# Patient Record
Sex: Female | Born: 1955 | Race: White | Hispanic: No | Marital: Married | Smoking: Current every day smoker
Health system: Southern US, Community
[De-identification: ages and names within clinical notes are randomized; demographics above are authoritative.]

## PROBLEM LIST (undated history)

## (undated) DIAGNOSIS — Z973 Presence of spectacles and contact lenses: Secondary | ICD-10-CM

## (undated) DIAGNOSIS — G43909 Migraine, unspecified, not intractable, without status migrainosus: Secondary | ICD-10-CM

## (undated) DIAGNOSIS — N390 Urinary tract infection, site not specified: Secondary | ICD-10-CM

## (undated) DIAGNOSIS — F419 Anxiety disorder, unspecified: Secondary | ICD-10-CM

## (undated) DIAGNOSIS — E119 Type 2 diabetes mellitus without complications: Secondary | ICD-10-CM

## (undated) DIAGNOSIS — Z972 Presence of dental prosthetic device (complete) (partial): Secondary | ICD-10-CM

## (undated) DIAGNOSIS — Z9289 Personal history of other medical treatment: Secondary | ICD-10-CM

## (undated) DIAGNOSIS — M1990 Unspecified osteoarthritis, unspecified site: Secondary | ICD-10-CM

## (undated) DIAGNOSIS — K219 Gastro-esophageal reflux disease without esophagitis: Secondary | ICD-10-CM

## (undated) DIAGNOSIS — J189 Pneumonia, unspecified organism: Secondary | ICD-10-CM

## (undated) HISTORY — PX: JOINT REPLACEMENT: SHX530

## (undated) HISTORY — PX: MULTIPLE TOOTH EXTRACTIONS: SHX2053

## (undated) HISTORY — PX: WRIST FRACTURE SURGERY: SHX121

## (undated) HISTORY — PX: FRACTURE SURGERY: SHX138

## (undated) HISTORY — PX: FEMUR FRACTURE SURGERY: SHX633

---

## 1988-04-17 HISTORY — PX: SPLENECTOMY, TOTAL: SHX788

## 1988-04-17 HISTORY — PX: APPENDECTOMY: SHX54

## 2014-04-17 HISTORY — PX: TOTAL HIP ARTHROPLASTY: SHX124

## 2017-02-24 DIAGNOSIS — F419 Anxiety disorder, unspecified: Secondary | ICD-10-CM

## 2017-02-24 DIAGNOSIS — Z720 Tobacco use: Secondary | ICD-10-CM

## 2017-02-24 DIAGNOSIS — S7292XA Unspecified fracture of left femur, initial encounter for closed fracture: Secondary | ICD-10-CM | POA: Diagnosis not present

## 2017-02-24 DIAGNOSIS — F1010 Alcohol abuse, uncomplicated: Secondary | ICD-10-CM

## 2017-02-25 DIAGNOSIS — S7292XA Unspecified fracture of left femur, initial encounter for closed fracture: Secondary | ICD-10-CM | POA: Diagnosis not present

## 2017-02-25 DIAGNOSIS — Z720 Tobacco use: Secondary | ICD-10-CM | POA: Diagnosis not present

## 2017-02-25 DIAGNOSIS — F1010 Alcohol abuse, uncomplicated: Secondary | ICD-10-CM | POA: Diagnosis not present

## 2017-02-25 DIAGNOSIS — F419 Anxiety disorder, unspecified: Secondary | ICD-10-CM | POA: Diagnosis not present

## 2017-02-26 DIAGNOSIS — F1010 Alcohol abuse, uncomplicated: Secondary | ICD-10-CM | POA: Diagnosis not present

## 2017-02-26 DIAGNOSIS — Z720 Tobacco use: Secondary | ICD-10-CM | POA: Diagnosis not present

## 2017-02-26 DIAGNOSIS — S7292XA Unspecified fracture of left femur, initial encounter for closed fracture: Secondary | ICD-10-CM | POA: Diagnosis not present

## 2017-02-26 DIAGNOSIS — F419 Anxiety disorder, unspecified: Secondary | ICD-10-CM | POA: Diagnosis not present

## 2017-02-27 DIAGNOSIS — F419 Anxiety disorder, unspecified: Secondary | ICD-10-CM | POA: Diagnosis not present

## 2017-02-27 DIAGNOSIS — S7292XA Unspecified fracture of left femur, initial encounter for closed fracture: Secondary | ICD-10-CM | POA: Diagnosis not present

## 2017-02-27 DIAGNOSIS — F1010 Alcohol abuse, uncomplicated: Secondary | ICD-10-CM | POA: Diagnosis not present

## 2017-02-27 DIAGNOSIS — Z720 Tobacco use: Secondary | ICD-10-CM | POA: Diagnosis not present

## 2017-02-28 DIAGNOSIS — S7292XA Unspecified fracture of left femur, initial encounter for closed fracture: Secondary | ICD-10-CM | POA: Diagnosis not present

## 2017-02-28 DIAGNOSIS — F1010 Alcohol abuse, uncomplicated: Secondary | ICD-10-CM | POA: Diagnosis not present

## 2017-02-28 DIAGNOSIS — F419 Anxiety disorder, unspecified: Secondary | ICD-10-CM | POA: Diagnosis not present

## 2017-02-28 DIAGNOSIS — Z720 Tobacco use: Secondary | ICD-10-CM | POA: Diagnosis not present

## 2017-03-01 DIAGNOSIS — S7292XA Unspecified fracture of left femur, initial encounter for closed fracture: Secondary | ICD-10-CM | POA: Diagnosis not present

## 2017-03-01 DIAGNOSIS — F419 Anxiety disorder, unspecified: Secondary | ICD-10-CM | POA: Diagnosis not present

## 2017-03-01 DIAGNOSIS — F1010 Alcohol abuse, uncomplicated: Secondary | ICD-10-CM | POA: Diagnosis not present

## 2017-03-01 DIAGNOSIS — Z720 Tobacco use: Secondary | ICD-10-CM | POA: Diagnosis not present

## 2017-12-25 ENCOUNTER — Other Ambulatory Visit: Payer: Self-pay

## 2017-12-25 ENCOUNTER — Inpatient Hospital Stay (HOSPITAL_COMMUNITY): Payer: BLUE CROSS/BLUE SHIELD

## 2017-12-25 ENCOUNTER — Encounter (HOSPITAL_COMMUNITY): Payer: Self-pay | Admitting: General Practice

## 2017-12-25 ENCOUNTER — Inpatient Hospital Stay (HOSPITAL_COMMUNITY)
Admission: AD | Admit: 2017-12-25 | Discharge: 2017-12-30 | DRG: 481 | Disposition: A | Discharge status: Home Health | Payer: BLUE CROSS/BLUE SHIELD | Attending: Internal Medicine | Admitting: Internal Medicine

## 2017-12-25 ENCOUNTER — Telehealth: Payer: Self-pay | Admitting: Emergency Medicine

## 2017-12-25 ENCOUNTER — Encounter (HOSPITAL_COMMUNITY): Payer: Self-pay | Admitting: *Deleted

## 2017-12-25 DIAGNOSIS — Z9081 Acquired absence of spleen: Secondary | ICD-10-CM

## 2017-12-25 DIAGNOSIS — I9581 Postprocedural hypotension: Secondary | ICD-10-CM | POA: Diagnosis not present

## 2017-12-25 DIAGNOSIS — F411 Generalized anxiety disorder: Secondary | ICD-10-CM | POA: Diagnosis present

## 2017-12-25 DIAGNOSIS — S72002A Fracture of unspecified part of neck of left femur, initial encounter for closed fracture: Secondary | ICD-10-CM | POA: Diagnosis not present

## 2017-12-25 DIAGNOSIS — I959 Hypotension, unspecified: Secondary | ICD-10-CM | POA: Diagnosis present

## 2017-12-25 DIAGNOSIS — F329 Major depressive disorder, single episode, unspecified: Secondary | ICD-10-CM | POA: Diagnosis present

## 2017-12-25 DIAGNOSIS — Z833 Family history of diabetes mellitus: Secondary | ICD-10-CM | POA: Diagnosis not present

## 2017-12-25 DIAGNOSIS — B9620 Unspecified Escherichia coli [E. coli] as the cause of diseases classified elsewhere: Secondary | ICD-10-CM | POA: Diagnosis not present

## 2017-12-25 DIAGNOSIS — S72112K Displaced fracture of greater trochanter of left femur, subsequent encounter for closed fracture with nonunion: Secondary | ICD-10-CM

## 2017-12-25 DIAGNOSIS — E8889 Other specified metabolic disorders: Secondary | ICD-10-CM | POA: Diagnosis present

## 2017-12-25 DIAGNOSIS — E440 Moderate protein-calorie malnutrition: Secondary | ICD-10-CM | POA: Diagnosis present

## 2017-12-25 DIAGNOSIS — E039 Hypothyroidism, unspecified: Secondary | ICD-10-CM | POA: Diagnosis present

## 2017-12-25 DIAGNOSIS — F17210 Nicotine dependence, cigarettes, uncomplicated: Secondary | ICD-10-CM | POA: Diagnosis present

## 2017-12-25 DIAGNOSIS — R338 Other retention of urine: Secondary | ICD-10-CM

## 2017-12-25 DIAGNOSIS — E119 Type 2 diabetes mellitus without complications: Secondary | ICD-10-CM | POA: Diagnosis present

## 2017-12-25 DIAGNOSIS — D62 Acute posthemorrhagic anemia: Secondary | ICD-10-CM | POA: Diagnosis not present

## 2017-12-25 DIAGNOSIS — S7292XA Unspecified fracture of left femur, initial encounter for closed fracture: Secondary | ICD-10-CM

## 2017-12-25 DIAGNOSIS — M79609 Pain in unspecified limb: Secondary | ICD-10-CM | POA: Diagnosis not present

## 2017-12-25 DIAGNOSIS — K219 Gastro-esophageal reflux disease without esophagitis: Secondary | ICD-10-CM | POA: Diagnosis present

## 2017-12-25 DIAGNOSIS — M62838 Other muscle spasm: Secondary | ICD-10-CM | POA: Diagnosis present

## 2017-12-25 DIAGNOSIS — I10 Essential (primary) hypertension: Secondary | ICD-10-CM | POA: Diagnosis present

## 2017-12-25 DIAGNOSIS — D509 Iron deficiency anemia, unspecified: Secondary | ICD-10-CM | POA: Diagnosis present

## 2017-12-25 DIAGNOSIS — F32A Depression, unspecified: Secondary | ICD-10-CM | POA: Diagnosis present

## 2017-12-25 DIAGNOSIS — Y792 Prosthetic and other implants, materials and accessory orthopedic devices associated with adverse incidents: Secondary | ICD-10-CM | POA: Diagnosis present

## 2017-12-25 DIAGNOSIS — Z419 Encounter for procedure for purposes other than remedying health state, unspecified: Secondary | ICD-10-CM

## 2017-12-25 DIAGNOSIS — J449 Chronic obstructive pulmonary disease, unspecified: Secondary | ICD-10-CM | POA: Diagnosis present

## 2017-12-25 DIAGNOSIS — Y848 Other medical procedures as the cause of abnormal reaction of the patient, or of later complication, without mention of misadventure at the time of the procedure: Secondary | ICD-10-CM | POA: Diagnosis present

## 2017-12-25 DIAGNOSIS — M9702XD Periprosthetic fracture around internal prosthetic left hip joint, subsequent encounter: Secondary | ICD-10-CM | POA: Diagnosis not present

## 2017-12-25 DIAGNOSIS — E559 Vitamin D deficiency, unspecified: Secondary | ICD-10-CM | POA: Diagnosis present

## 2017-12-25 DIAGNOSIS — D508 Other iron deficiency anemias: Secondary | ICD-10-CM | POA: Diagnosis not present

## 2017-12-25 DIAGNOSIS — N390 Urinary tract infection, site not specified: Secondary | ICD-10-CM | POA: Diagnosis not present

## 2017-12-25 DIAGNOSIS — S72332K Displaced oblique fracture of shaft of left femur, subsequent encounter for closed fracture with nonunion: Secondary | ICD-10-CM

## 2017-12-25 DIAGNOSIS — M7989 Other specified soft tissue disorders: Secondary | ICD-10-CM | POA: Diagnosis not present

## 2017-12-25 DIAGNOSIS — R0602 Shortness of breath: Secondary | ICD-10-CM | POA: Diagnosis present

## 2017-12-25 DIAGNOSIS — T84228A Displacement of internal fixation device of other bones, initial encounter: Secondary | ICD-10-CM | POA: Diagnosis present

## 2017-12-25 DIAGNOSIS — T1490XA Injury, unspecified, initial encounter: Secondary | ICD-10-CM

## 2017-12-25 HISTORY — DX: Urinary tract infection, site not specified: N39.0

## 2017-12-25 HISTORY — DX: Personal history of other medical treatment: Z92.89

## 2017-12-25 HISTORY — DX: Migraine, unspecified, not intractable, without status migrainosus: G43.909

## 2017-12-25 LAB — CBC WITH DIFFERENTIAL/PLATELET
Abs Immature Granulocytes: 0 10*3/uL (ref 0.0–0.1)
Basophils Absolute: 0.1 10*3/uL (ref 0.0–0.1)
Basophils Relative: 1 %
EOS PCT: 2 %
Eosinophils Absolute: 0.1 10*3/uL (ref 0.0–0.7)
HEMATOCRIT: 27.9 % — AB (ref 36.0–46.0)
Hemoglobin: 8.8 g/dL — ABNORMAL LOW (ref 12.0–15.0)
Immature Granulocytes: 0 %
LYMPHS ABS: 2.4 10*3/uL (ref 0.7–4.0)
Lymphocytes Relative: 41 %
MCH: 23.4 pg — AB (ref 26.0–34.0)
MCHC: 31.5 g/dL (ref 30.0–36.0)
MCV: 74.2 fL — AB (ref 78.0–100.0)
MONOS PCT: 11 %
Monocytes Absolute: 0.7 10*3/uL (ref 0.1–1.0)
Neutro Abs: 2.6 10*3/uL (ref 1.7–7.7)
Neutrophils Relative %: 45 %
Platelets: 418 10*3/uL — ABNORMAL HIGH (ref 150–400)
RBC: 3.76 MIL/uL — AB (ref 3.87–5.11)
RDW: 19.9 % — AB (ref 11.5–15.5)
WBC: 5.8 10*3/uL (ref 4.0–10.5)

## 2017-12-25 LAB — BASIC METABOLIC PANEL
Anion gap: 11 (ref 5–15)
BUN: 17 mg/dL (ref 8–23)
CHLORIDE: 102 mmol/L (ref 98–111)
CO2: 20 mmol/L — ABNORMAL LOW (ref 22–32)
Calcium: 8.9 mg/dL (ref 8.9–10.3)
Creatinine, Ser: 0.61 mg/dL (ref 0.44–1.00)
GFR calc Af Amer: >60 mL/min (ref >60)
GFR calc non Af Amer: >60 mL/min (ref >60)
Glucose, Bld: 111 mg/dL — ABNORMAL HIGH (ref 70–99)
Potassium: 4.2 mmol/L (ref 3.5–5.1)
SODIUM: 133 mmol/L — AB (ref 135–145)

## 2017-12-25 LAB — PREPARE RBC (CROSSMATCH)

## 2017-12-25 MED ORDER — SODIUM CHLORIDE 0.9 % IV SOLN
INTRAVENOUS | Status: DC
  Administered 2017-12-25 – 2017-12-26 (×3): via INTRAVENOUS

## 2017-12-25 MED ORDER — ONDANSETRON HCL 4 MG/2ML IJ SOLN: 4.0000 mg | Freq: Four times a day (QID) | INTRAMUSCULAR | Status: DC | PRN

## 2017-12-25 MED ORDER — ONDANSETRON HCL 4 MG PO TABS: 4.0000 mg | ORAL_TABLET | Freq: Four times a day (QID) | ORAL | Status: DC | PRN

## 2017-12-25 MED ORDER — HYDROCODONE-ACETAMINOPHEN 5-325 MG PO TABS
1.0000 | ORAL_TABLET | ORAL | Status: DC | PRN
  Administered 2017-12-25: 1 via ORAL
  Administered 2017-12-26 – 2017-12-27 (×4): 2 via ORAL
  Filled 2017-12-25 (×4): qty 2
  Filled 2017-12-25: qty 1

## 2017-12-25 MED ORDER — ENOXAPARIN SODIUM 30 MG/0.3ML ~~LOC~~ SOLN
30.0000 mg | SUBCUTANEOUS | Status: DC
  Administered 2017-12-25: 30 mg via SUBCUTANEOUS
  Filled 2017-12-25: qty 0.3

## 2017-12-25 MED ORDER — CEFAZOLIN (ANCEF) 1 G IV SOLR: 2.0000 g | INTRAVENOUS | Status: DC

## 2017-12-25 MED ORDER — CEFAZOLIN SODIUM-DEXTROSE 2-4 GM/100ML-% IV SOLN
2.0000 g | INTRAVENOUS | Status: AC
  Administered 2017-12-26: 2 g via INTRAVENOUS
  Filled 2017-12-25 (×2): qty 100

## 2017-12-25 NOTE — H&P (Signed)
History and Physical   Melanie Parsons JXB:[id number] DOB: 1956/03/16 DOA: 12/25/2017  Referring MD/NP/PA: Outpatient  PCP: [name], MD   Outpatient Specialists: Truitt Merle, MD   Patient coming from: Home  Chief Complaint: Left hip pain  HPI: Melanie Parsons is a 63 y.o. female with medical history significant of anxiety disorder, GERD, osteoarthritis, previous history of alcoholism who had peri-prostatic femural fracture last November with repair.  Patient has been doing fine until the last few days when she has had significant pain in the left hip.  She apparently had the nails in the area come loose.  She was seen by orthopedic surgery at [facility] where she had the original surgery done.  Patient was sent over here for repair tomorrow.  She was sent as a direct admit.  No history was sent with the patient.  No accepting physician on record.  History is therefore obtained from the patient indicating that she has been set up for possible surgery tomorrow.  She has no fever, no chills no nausea vomiting or diarrhea  ED Course: Patient was directly admitted  Review of Systems: As per HPI otherwise 10 point review of systems negative.    Past Medical History:  Diagnosis Date  . Anxiety   . Arthritis   . GERD (gastroesophageal reflux disease)   . Headache    migraines  . Pneumonia   . Wears glasses   . Wears partial dentures     Past Surgical History:  Procedure Laterality Date  . APPENDECTOMY    . FEMUR SURGERY    . JOINT REPLACEMENT     left hip 2016  . MULTIPLE TOOTH EXTRACTIONS    . SPLENECTOMY, TOTAL     1990  . WRIST SURGERY     right     reports that she has been smoking cigarettes. She has been smoking about 0.50 packs per day. She has never used smokeless tobacco. She reports that she drank alcohol. She reports that she does not use drugs.  Allergies  Allergen Reactions  . Penicillins Rash    Rash and itching    No family history  on file.   Prior to Admission medications   Not on File    Physical Exam: Vitals:   12/25/17 1829  BP: 97/71  Pulse: 88  Resp: 15  Temp: 97.9 F (36.6 C)  TempSrc: Oral  SpO2: 97%  Weight: 47.1 kg  Height: 5\' 10"  (1.778 m)      Constitutional: NAD, calm, comfortable Vitals:   12/25/17 1829  BP: 97/71  Pulse: 88  Resp: 15  Temp: 97.9 F (36.6 C)  TempSrc: Oral  SpO2: 97%  Weight: 47.1 kg  Height: 5\' 10"  (1.778 m)   Eyes: PERRL, lids and conjunctivae normal ENMT: Mucous membranes are moist. Posterior pharynx clear of any exudate or lesions.Normal dentition.  Neck: normal, supple, no masses, no thyromegaly Respiratory: clear to auscultation bilaterally, no wheezing, no crackles. Normal respiratory effort. No accessory muscle use.  Cardiovascular: Regular rate and rhythm, no murmurs / rubs / gallops. No extremity edema. 2+ pedal pulses. No carotid bruits.  Abdomen: no tenderness, no masses palpated. No hepatosplenomegaly. Bowel sounds positive.  Musculoskeletal: no clubbing / cyanosis.  Tenderness with lateral rotation of left lower extremities. Good ROM, no contractures. Normal muscle tone.  Skin: no rashes, lesions, ulcers. No induration Neurologic: CN 2-12 grossly intact. Sensation intact, DTR normal. Strength 5/5 in all 4.  Psychiatric: Normal judgment and  insight. Alert and oriented x 3. Normal mood.     Labs on Admission: I have personally reviewed following labs and imaging studies  CBC: No results for input(s): WBC, NEUTROABS, HGB, HCT, MCV, PLT in the last 168 hours. Basic Metabolic Panel: No results for input(s): NA, K, CL, CO2, GLUCOSE, BUN, CREATININE, CALCIUM, MG, PHOS in the last 168 hours. GFR: CrCl cannot be calculated (No successful lab value found.). Liver Function Tests: No results for input(s): AST, ALT, ALKPHOS, BILITOT, PROT, ALBUMIN in the last 168 hours. No results for input(s): LIPASE, AMYLASE in the last 168 hours. No results for  input(s): AMMONIA in the last 168 hours. Coagulation Profile: No results for input(s): INR, PROTIME in the last 168 hours. Cardiac Enzymes: No results for input(s): CKTOTAL, CKMB, CKMBINDEX, TROPONINI in the last 168 hours. BNP (last 3 results) No results for input(s): PROBNP in the last 8760 hours. HbA1C: No results for input(s): HGBA1C in the last 72 hours. CBG: No results for input(s): GLUCAP in the last 168 hours. Lipid Profile: No results for input(s): CHOL, HDL, LDLCALC, TRIG, CHOLHDL, LDLDIRECT in the last 72 hours. Thyroid Function Tests: No results for input(s): TSH, T4TOTAL, FREET4, T3FREE, THYROIDAB in the last 72 hours. Anemia Panel: No results for input(s): VITAMINB12, FOLATE, FERRITIN, TIBC, IRON, RETICCTPCT in the last 72 hours. Urine analysis: No results found for: COLORURINE, APPEARANCEUR, LABSPEC, PHURINE, GLUCOSEU, HGBUR, BILIRUBINUR, KETONESUR, PROTEINUR, UROBILINOGEN, NITRITE, LEUKOCYTESUR Sepsis Labs: @LABRCNTIP (procalcitonin:4,lacticidven:4) )No results found for this or any previous visit (from the past 240 hour(s)).   Radiological Exams on Admission: No results found.   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  Also has been contacted.  Patient appears medically stable for surgery.  We will keep her n.p.o. after midnight  #2 GERD: Continue with PPIs.  #3 COPD: Mild with no exacerbation.  Continue treatment  #4 hypertension: Continue blood pressure monitoring.  #5 generalized anxiety disorder: Continue Xanax.  #6 depression: Continue home regimen   DVT prophylaxis:  Lovenox Code Status: Full code Family Communication: Husband who is with patient Disposition Plan: Home Consults called: Orthopedics, Dr. Jena Gauss Admission status: Inpatient  Severity of Illness: The appropriate patient status for this patient is INPATIENT. Inpatient status is judged to be reasonable and necessary in order to provide the required intensity of service to ensure the patient's safety. The patient's presenting symptoms, physical exam findings, and initial radiographic and laboratory data in the context of their chronic comorbidities is felt to place them at high risk for further clinical deterioration. Furthermore, it is not anticipated that the patient will be medically stable for discharge from the hospital within 2 midnights of admission. The following factors support the patient status of inpatient.   " The patient's presenting symptoms include left hip pain. " The worrisome physical exam findings include tenderness and lateral rotation of the left hip. " The initial radiographic and laboratory data are worrisome because of x-ray showing dislocated previous fracture. " The chronic co-morbidities include COPD.   * I certify that at the point of admission it is my clinical judgment that the patient will require inpatient hospital care spanning beyond 2  midnights from the point of admission due to high intensity of service, high risk for further deterioration and high frequency of surveillance required.Lonia Blood MD [facility] Pager [phone]  If 7PM-7AM, please contact night-coverage [email or web address] Password [facility]  12/25/2017, 7:49 PM

## 2017-12-26 ENCOUNTER — Inpatient Hospital Stay (HOSPITAL_COMMUNITY): Payer: BLUE CROSS/BLUE SHIELD

## 2017-12-26 ENCOUNTER — Encounter (HOSPITAL_COMMUNITY): Payer: Self-pay | Admitting: Anesthesiology

## 2017-12-26 ENCOUNTER — Inpatient Hospital Stay (HOSPITAL_COMMUNITY): Payer: BLUE CROSS/BLUE SHIELD | Admitting: Anesthesiology

## 2017-12-26 ENCOUNTER — Encounter (HOSPITAL_COMMUNITY)
Admission: AD | Disposition: A | Discharge status: Home Health | Payer: Self-pay | Source: Home / Self Care | Attending: Internal Medicine

## 2017-12-26 ENCOUNTER — Inpatient Hospital Stay (HOSPITAL_COMMUNITY): Admission: RE | Admit: 2017-12-26 | Payer: BLUE CROSS/BLUE SHIELD | Source: Ambulatory Visit | Admitting: Student

## 2017-12-26 DIAGNOSIS — E039 Hypothyroidism, unspecified: Secondary | ICD-10-CM

## 2017-12-26 DIAGNOSIS — S72332K Displaced oblique fracture of shaft of left femur, subsequent encounter for closed fracture with nonunion: Secondary | ICD-10-CM

## 2017-12-26 HISTORY — DX: Presence of spectacles and contact lenses: Z97.3

## 2017-12-26 HISTORY — DX: Presence of dental prosthetic device (complete) (partial): Z97.2

## 2017-12-26 HISTORY — DX: Unspecified osteoarthritis, unspecified site: M19.90

## 2017-12-26 HISTORY — PX: HARDWARE REMOVAL: SHX979

## 2017-12-26 HISTORY — DX: Pneumonia, unspecified organism: J18.9

## 2017-12-26 HISTORY — PX: ORIF PERIPROSTHETIC FRACTURE: SHX5034

## 2017-12-26 HISTORY — DX: Anxiety disorder, unspecified: F41.9

## 2017-12-26 HISTORY — DX: Gastro-esophageal reflux disease without esophagitis: K21.9

## 2017-12-26 LAB — PROTIME-INR
INR: 1.25
Prothrombin Time: 15.6 seconds — ABNORMAL HIGH (ref 11.4–15.2)

## 2017-12-26 LAB — T4, FREE: Free T4: 0.72 ng/dL — ABNORMAL LOW (ref 0.82–1.77)

## 2017-12-26 LAB — COMPREHENSIVE METABOLIC PANEL
ALT: 18 U/L (ref 0–44)
ANION GAP: 9 (ref 5–15)
AST: 24 U/L (ref 15–41)
Albumin: 2.6 g/dL — ABNORMAL LOW (ref 3.5–5.0)
Alkaline Phosphatase: 64 U/L (ref 38–126)
BUN: 14 mg/dL (ref 8–23)
CHLORIDE: 107 mmol/L (ref 98–111)
CO2: 20 mmol/L — AB (ref 22–32)
CREATININE: 0.5 mg/dL (ref 0.44–1.00)
Calcium: 8.7 mg/dL — ABNORMAL LOW (ref 8.9–10.3)
Glucose, Bld: 80 mg/dL (ref 70–99)
POTASSIUM: 3.8 mmol/L (ref 3.5–5.1)
SODIUM: 136 mmol/L (ref 135–145)
Total Bilirubin: 0.3 mg/dL (ref 0.3–1.2)
Total Protein: 5.7 g/dL — ABNORMAL LOW (ref 6.5–8.1)

## 2017-12-26 LAB — URINALYSIS, ROUTINE W REFLEX MICROSCOPIC
Bilirubin Urine: NEGATIVE
GLUCOSE, UA: NEGATIVE mg/dL
KETONES UR: 5 mg/dL — AB
Nitrite: POSITIVE — AB
PH: 5 (ref 5.0–8.0)
Protein, ur: NEGATIVE mg/dL
Specific Gravity, Urine: 1.016 (ref 1.005–1.030)

## 2017-12-26 LAB — ABO/RH: ABO/RH(D): O POS

## 2017-12-26 LAB — HIV ANTIBODY (ROUTINE TESTING W REFLEX): HIV Screen 4th Generation wRfx: NONREACTIVE

## 2017-12-26 LAB — PREALBUMIN: PREALBUMIN: 22.6 mg/dL (ref 18–38)

## 2017-12-26 LAB — CBC
HEMATOCRIT: 27.5 % — AB (ref 36.0–46.0)
HEMOGLOBIN: 8.6 g/dL — AB (ref 12.0–15.0)
MCH: 23.2 pg — AB (ref 26.0–34.0)
MCHC: 31.3 g/dL (ref 30.0–36.0)
MCV: 74.3 fL — AB (ref 78.0–100.0)
PLATELETS: 423 10*3/uL — AB (ref 150–400)
RBC: 3.7 MIL/uL — AB (ref 3.87–5.11)
RDW: 20.1 % — ABNORMAL HIGH (ref 11.5–15.5)
WBC: 4.7 10*3/uL (ref 4.0–10.5)

## 2017-12-26 LAB — MAGNESIUM: Magnesium: 1.7 mg/dL (ref 1.7–2.4)

## 2017-12-26 LAB — SEDIMENTATION RATE: SED RATE: 37 mm/h — AB (ref 0–22)

## 2017-12-26 LAB — C-REACTIVE PROTEIN: CRP: 0.9 mg/dL (ref <1.0)

## 2017-12-26 LAB — SURGICAL PCR SCREEN
MRSA, PCR: NEGATIVE
STAPHYLOCOCCUS AUREUS: NEGATIVE

## 2017-12-26 LAB — HEMOGLOBIN A1C
Hgb A1c MFr Bld: 7.2 % — ABNORMAL HIGH (ref 4.8–5.6)
Mean Plasma Glucose: 159.94 mg/dL

## 2017-12-26 LAB — APTT: aPTT: 44 seconds — ABNORMAL HIGH (ref 24–36)

## 2017-12-26 LAB — GLUCOSE, CAPILLARY: Glucose-Capillary: 98 mg/dL (ref 70–99)

## 2017-12-26 LAB — PHOSPHORUS: PHOSPHORUS: 3.8 mg/dL (ref 2.5–4.6)

## 2017-12-26 LAB — TSH: TSH: 0.05 u[IU]/mL — ABNORMAL LOW (ref 0.350–4.500)

## 2017-12-26 SURGERY — OPEN REDUCTION INTERNAL FIXATION (ORIF) PERIPROSTHETIC FRACTURE
Anesthesia: General | Site: Leg Upper | Laterality: Left

## 2017-12-26 MED ORDER — SODIUM CHLORIDE 0.9 % IR SOLN
Status: DC | PRN
  Administered 2017-12-26: 3000 mL

## 2017-12-26 MED ORDER — ONDANSETRON HCL 4 MG/2ML IJ SOLN
INTRAMUSCULAR | Status: DC | PRN
  Administered 2017-12-26: 4 mg via INTRAVENOUS

## 2017-12-26 MED ORDER — TOBRAMYCIN SULFATE 1.2 G IJ SOLR
INTRAMUSCULAR | Status: DC | PRN
  Administered 2017-12-26: 1.2 g via TOPICAL

## 2017-12-26 MED ORDER — VANCOMYCIN HCL 1000 MG IV SOLR
INTRAVENOUS | Status: AC
  Filled 2017-12-26: qty 1000

## 2017-12-26 MED ORDER — FENTANYL CITRATE (PF) 250 MCG/5ML IJ SOLN
INTRAMUSCULAR | Status: AC
  Filled 2017-12-26: qty 5

## 2017-12-26 MED ORDER — MIDAZOLAM HCL 2 MG/2ML IJ SOLN
INTRAMUSCULAR | Status: AC
  Filled 2017-12-26: qty 2

## 2017-12-26 MED ORDER — SUGAMMADEX SODIUM 200 MG/2ML IV SOLN
INTRAVENOUS | Status: DC | PRN
  Administered 2017-12-26: 100 mg via INTRAVENOUS

## 2017-12-26 MED ORDER — EPHEDRINE 5 MG/ML INJ
INTRAVENOUS | Status: AC
  Filled 2017-12-26: qty 10

## 2017-12-26 MED ORDER — SODIUM CHLORIDE 0.9 % IV SOLN
INTRAVENOUS | Status: DC | PRN
  Administered 2017-12-26: 50 ug/min via INTRAVENOUS

## 2017-12-26 MED ORDER — LACTATED RINGERS IV SOLN
INTRAVENOUS | Status: DC | PRN
  Administered 2017-12-26: 11:00:00 via INTRAVENOUS

## 2017-12-26 MED ORDER — ALPRAZOLAM 0.5 MG PO TABS
1.0000 mg | ORAL_TABLET | Freq: Three times a day (TID) | ORAL | Status: DC | PRN
  Administered 2017-12-27 – 2017-12-30 (×9): 1 mg via ORAL
  Filled 2017-12-26 (×9): qty 2

## 2017-12-26 MED ORDER — PHENYLEPHRINE 40 MCG/ML (10ML) SYRINGE FOR IV PUSH (FOR BLOOD PRESSURE SUPPORT)
PREFILLED_SYRINGE | INTRAVENOUS | Status: AC
  Filled 2017-12-26: qty 10

## 2017-12-26 MED ORDER — MIDAZOLAM HCL 5 MG/5ML IJ SOLN
INTRAMUSCULAR | Status: DC | PRN
  Administered 2017-12-26: 2 mg via INTRAVENOUS

## 2017-12-26 MED ORDER — PROPOFOL 10 MG/ML IV BOLUS
INTRAVENOUS | Status: AC
  Filled 2017-12-26: qty 20

## 2017-12-26 MED ORDER — MIRTAZAPINE 15 MG PO TABS
30.0000 mg | ORAL_TABLET | Freq: Every day | ORAL | Status: DC
  Administered 2017-12-26 – 2017-12-29 (×4): 30 mg via ORAL
  Filled 2017-12-26 (×4): qty 2

## 2017-12-26 MED ORDER — LIDOCAINE 2% (20 MG/ML) 5 ML SYRINGE
INTRAMUSCULAR | Status: AC
  Filled 2017-12-26: qty 5

## 2017-12-26 MED ORDER — PHENYLEPHRINE HCL 10 MG/ML IJ SOLN
INTRAMUSCULAR | Status: DC | PRN
  Administered 2017-12-26 (×2): 80 ug via INTRAVENOUS

## 2017-12-26 MED ORDER — FENTANYL CITRATE (PF) 100 MCG/2ML IJ SOLN
INTRAMUSCULAR | Status: DC | PRN
  Administered 2017-12-26: 50 ug via INTRAVENOUS
  Administered 2017-12-26: 25 ug via INTRAVENOUS
  Administered 2017-12-26: 50 ug via INTRAVENOUS
  Administered 2017-12-26: 25 ug via INTRAVENOUS
  Administered 2017-12-26: 75 ug via INTRAVENOUS
  Administered 2017-12-26: 25 ug via INTRAVENOUS

## 2017-12-26 MED ORDER — DEXAMETHASONE SODIUM PHOSPHATE 10 MG/ML IJ SOLN
INTRAMUSCULAR | Status: DC | PRN
  Administered 2017-12-26: 10 mg via INTRAVENOUS

## 2017-12-26 MED ORDER — TOBRAMYCIN SULFATE 1.2 G IJ SOLR
INTRAMUSCULAR | Status: AC
  Filled 2017-12-26: qty 1.2

## 2017-12-26 MED ORDER — PROPOFOL 10 MG/ML IV BOLUS
INTRAVENOUS | Status: DC | PRN
  Administered 2017-12-26: 100 mg via INTRAVENOUS

## 2017-12-26 MED ORDER — LIDOCAINE 2% (20 MG/ML) 5 ML SYRINGE
INTRAMUSCULAR | Status: DC | PRN
  Administered 2017-12-26: 40 mg via INTRAVENOUS

## 2017-12-26 MED ORDER — HYDROMORPHONE HCL 1 MG/ML IJ SOLN
INTRAMUSCULAR | Status: AC
  Administered 2017-12-26: 0.25 mg via INTRAVENOUS
  Filled 2017-12-26: qty 1

## 2017-12-26 MED ORDER — INSULIN ASPART 100 UNIT/ML ~~LOC~~ SOLN
0.0000 [IU] | Freq: Three times a day (TID) | SUBCUTANEOUS | Status: DC
  Administered 2017-12-27: 2 [IU] via SUBCUTANEOUS

## 2017-12-26 MED ORDER — ROCURONIUM BROMIDE 50 MG/5ML IV SOSY
PREFILLED_SYRINGE | INTRAVENOUS | Status: DC | PRN
  Administered 2017-12-26: 10 mg via INTRAVENOUS
  Administered 2017-12-26: 45 mg via INTRAVENOUS

## 2017-12-26 MED ORDER — DEXAMETHASONE SODIUM PHOSPHATE 10 MG/ML IJ SOLN
INTRAMUSCULAR | Status: AC
  Filled 2017-12-26: qty 2

## 2017-12-26 MED ORDER — PANTOPRAZOLE SODIUM 40 MG PO TBEC
40.0000 mg | DELAYED_RELEASE_TABLET | Freq: Every day | ORAL | Status: DC
  Administered 2017-12-27 – 2017-12-30 (×4): 40 mg via ORAL
  Filled 2017-12-26 (×4): qty 1

## 2017-12-26 MED ORDER — ROCURONIUM BROMIDE 50 MG/5ML IV SOSY
PREFILLED_SYRINGE | INTRAVENOUS | Status: AC
  Filled 2017-12-26: qty 5

## 2017-12-26 MED ORDER — HYDROMORPHONE HCL 1 MG/ML IJ SOLN
0.2500 mg | INTRAMUSCULAR | Status: DC | PRN
  Administered 2017-12-26 (×2): 0.25 mg via INTRAVENOUS

## 2017-12-26 MED ORDER — ADULT MULTIVITAMIN W/MINERALS CH
1.0000 | ORAL_TABLET | Freq: Every day | ORAL | Status: DC
  Administered 2017-12-27 – 2017-12-30 (×4): 1 via ORAL
  Filled 2017-12-26 (×4): qty 1

## 2017-12-26 MED ORDER — HYDROMORPHONE HCL 1 MG/ML IJ SOLN: 1.0000 mg | INTRAMUSCULAR | Status: DC | PRN

## 2017-12-26 MED ORDER — 0.9 % SODIUM CHLORIDE (POUR BTL) OPTIME
TOPICAL | Status: DC | PRN
  Administered 2017-12-26: 1000 mL

## 2017-12-26 MED ORDER — ENSURE ENLIVE PO LIQD
237.0000 mL | Freq: Two times a day (BID) | ORAL | Status: DC
  Administered 2017-12-27: 237 mL via ORAL

## 2017-12-26 MED ORDER — SODIUM CHLORIDE 0.9 % IV SOLN
1.0000 g | INTRAVENOUS | Status: DC
  Administered 2017-12-26 – 2017-12-27 (×2): 1 g via INTRAVENOUS
  Filled 2017-12-26 (×2): qty 10

## 2017-12-26 MED ORDER — ONDANSETRON HCL 4 MG/2ML IJ SOLN
INTRAMUSCULAR | Status: AC
  Filled 2017-12-26: qty 4

## 2017-12-26 MED ORDER — PROMETHAZINE HCL 25 MG/ML IJ SOLN: 6.2500 mg | INTRAMUSCULAR | Status: DC | PRN

## 2017-12-26 MED ORDER — VANCOMYCIN HCL 1000 MG IV SOLR
INTRAVENOUS | Status: DC | PRN
  Administered 2017-12-26: 1000 mg via TOPICAL

## 2017-12-26 MED ORDER — CEFAZOLIN SODIUM-DEXTROSE 2-4 GM/100ML-% IV SOLN
2.0000 g | Freq: Three times a day (TID) | INTRAVENOUS | Status: DC
  Administered 2017-12-26 – 2017-12-27 (×2): 2 g via INTRAVENOUS
  Filled 2017-12-26 (×3): qty 100

## 2017-12-26 SURGICAL SUPPLY — 69 items
BIT DRILL QC 3.3X195 (BIT) ×4 IMPLANT
BLADE CLIPPER SURG (BLADE) IMPLANT
BNDG COHESIVE 6X5 TAN STRL LF (GAUZE/BANDAGES/DRESSINGS) IMPLANT
BNDG ELASTIC 6X10 VLCR STRL LF (GAUZE/BANDAGES/DRESSINGS) IMPLANT
BRUSH SCRUB SURG 4.25 DISP (MISCELLANEOUS) ×4 IMPLANT
CABLE CERLAGE W/CRIMP 1.8 (Cable) ×3 IMPLANT
CABLE CERLAGE W/CRIMP 1.8MM (Cable) ×1 IMPLANT
CANISTER SUCT 3000ML PPV (MISCELLANEOUS) ×4 IMPLANT
CAP LOCK NCB (Cap) ×36 IMPLANT
CHLORAPREP W/TINT 26ML (MISCELLANEOUS) ×4 IMPLANT
CONT SPECI 4OZ STER CLIK (MISCELLANEOUS) ×4 IMPLANT
COVER SURGICAL LIGHT HANDLE (MISCELLANEOUS) ×4 IMPLANT
DRAPE C-ARM 42X72 X-RAY (DRAPES) ×4 IMPLANT
DRAPE C-ARMOR (DRAPES) ×4 IMPLANT
DRAPE ORTHO SPLIT 77X108 STRL (DRAPES) ×4
DRAPE PROXIMA HALF (DRAPES) ×8 IMPLANT
DRAPE SURG 17X23 STRL (DRAPES) ×4 IMPLANT
DRAPE SURG ORHT 6 SPLT 77X108 (DRAPES) ×4 IMPLANT
DRAPE U-SHAPE 47X51 STRL (DRAPES) ×4 IMPLANT
DRSG ADAPTIC 3X8 NADH LF (GAUZE/BANDAGES/DRESSINGS) IMPLANT
DRSG AQUACEL AG ADV 3.5X14 (GAUZE/BANDAGES/DRESSINGS) ×4 IMPLANT
DRSG MEPILEX BORDER 4X12 (GAUZE/BANDAGES/DRESSINGS) IMPLANT
DRSG MEPILEX BORDER 4X4 (GAUZE/BANDAGES/DRESSINGS) IMPLANT
DRSG MEPILEX BORDER 4X8 (GAUZE/BANDAGES/DRESSINGS) IMPLANT
DRSG PAD ABDOMINAL 8X10 ST (GAUZE/BANDAGES/DRESSINGS) ×12 IMPLANT
ELECT REM PT RETURN 9FT ADLT (ELECTROSURGICAL) ×4
ELECTRODE REM PT RTRN 9FT ADLT (ELECTROSURGICAL) ×2 IMPLANT
GAUZE SPONGE 4X4 12PLY STRL (GAUZE/BANDAGES/DRESSINGS) ×4 IMPLANT
GLOVE BIO SURGEON STRL SZ7.5 (GLOVE) ×16 IMPLANT
GLOVE BIOGEL PI IND STRL 7.5 (GLOVE) ×2 IMPLANT
GLOVE BIOGEL PI INDICATOR 7.5 (GLOVE) ×2
GOWN STRL REUS W/ TWL LRG LVL3 (GOWN DISPOSABLE) ×6 IMPLANT
GOWN STRL REUS W/TWL LRG LVL3 (GOWN DISPOSABLE) ×6
HANDPIECE INTERPULSE COAX TIP (DISPOSABLE) ×2
K-WIRE 2.0 (WIRE) ×2
K-WIRE FXSTD 280X2XNS SS (WIRE) ×2
KIT BASIN OR (CUSTOM PROCEDURE TRAY) ×4 IMPLANT
KIT TURNOVER KIT B (KITS) ×4 IMPLANT
KWIRE FXSTD 280X2XNS SS (WIRE) ×2 IMPLANT
LOCKPLATE CABLE BUTTON NCP HIP (Orthopedic Implant) ×4 IMPLANT
NS IRRIG 1000ML POUR BTL (IV SOLUTION) ×4 IMPLANT
PACK TOTAL JOINT (CUSTOM PROCEDURE TRAY) ×4 IMPLANT
PAD ARMBOARD 7.5X6 YLW CONV (MISCELLANEOUS) ×8 IMPLANT
PAD CAST 4YDX4 CTTN HI CHSV (CAST SUPPLIES) ×2 IMPLANT
PADDING CAST COTTON 4X4 STRL (CAST SUPPLIES) ×2
PADDING CAST COTTON 6X4 STRL (CAST SUPPLIES) ×4 IMPLANT
PLATE LT PROX FEMUR 12H (Plate) ×4 IMPLANT
SCREW 5.0 32MM (Screw) ×4 IMPLANT
SCREW NCB 4.0 32MM (Screw) ×4 IMPLANT
SCREW NCB 4.0MX34M (Screw) ×16 IMPLANT
SCREW NCB 4.0MX38M (Screw) ×4 IMPLANT
SCREW NCB 4.0MX42M (Screw) ×8 IMPLANT
SCREW NCB 4.0MX46M (Screw) ×4 IMPLANT
SCREW NCB 4.0X36MM (Screw) ×8 IMPLANT
SET HNDPC FAN SPRY TIP SCT (DISPOSABLE) ×2 IMPLANT
SPONGE LAP 18X18 X RAY DECT (DISPOSABLE) ×8 IMPLANT
STAPLER VISISTAT 35W (STAPLE) ×4 IMPLANT
SUCTION FRAZIER HANDLE 10FR (MISCELLANEOUS) ×2
SUCTION TUBE FRAZIER 10FR DISP (MISCELLANEOUS) ×2 IMPLANT
SUT ETHILON 3 0 PS 1 (SUTURE) ×16 IMPLANT
SUT VIC AB 0 CT1 27 (SUTURE)
SUT VIC AB 0 CT1 27XBRD ANBCTR (SUTURE) IMPLANT
SUT VIC AB 1 CT1 27 (SUTURE)
SUT VIC AB 1 CT1 27XBRD ANBCTR (SUTURE) IMPLANT
SUT VIC AB 2-0 CT1 27 (SUTURE) ×4
SUT VIC AB 2-0 CT1 TAPERPNT 27 (SUTURE) ×4 IMPLANT
TOWEL OR 17X26 10 PK STRL BLUE (TOWEL DISPOSABLE) ×8 IMPLANT
TRAY FOLEY MTR SLVR 16FR STAT (SET/KITS/TRAYS/PACK) IMPLANT
WATER STERILE IRR 1000ML POUR (IV SOLUTION) ×8 IMPLANT

## 2017-12-26 NOTE — Consult Note (Addendum)
Orthopaedic Trauma Service (OTS) Consult   Patient ID: Orissa Whisonant MRN: [id number] DOB/AGE: [birth year] 62 y.o.  Reason for Consult:Left femur nonunion Referring Physician: Dr. Garnette Gunner, MD [facility]  HPI: Zaynah Adelson Meily Evon is an 62 y.o. female who is being seen in consultation at the request of Dr. [name] for evaluation of a left femur nonunion.  Patient had a periprosthetic femur fracture in November of last year.  This was fixed with an ORIF.  The patient had a hemiarthroplasty performed in 2016 and she was doing fine from that she was ambulating without assist device.  She fell and broke her femur distal to the prosthesis.  She was fixed and then made touchdown weightbearing for approximately 3 months.  She was allowed to start ambulating however according to the patient she had some malalignment of her leg and she continued to have pain.  Over the last 2 weeks she noticed a significant deformity to her leg and not over her lateral femur.  She was at eventually seen by her initial surgeon Dr. [name] who sent her to the hospital and she was subsequently referred to [place] and transferred to [facility].  The patient states that she was never put on antibiotics for any draining wounds.  She had no problems with her incision healing.  The patient denies any major medical problems she states that she has anxiety for which she takes Xanax 3 times a day as needed.  She also takes medication for reflux and also for sleep at night.  She denies any history of diabetes however she says that she does not eat as well recently she has wanted chocolate milk and honey buns.  She denies a significant alcohol intake to me.  But she does have a history of alcohol abuse in her chart.  She states that she smokes about a pack of cigarettes a day and has been doing so for multiple years.  She also notes that she has a history of anemia.  Past Medical History:  Diagnosis Date   . Anxiety   . Arthritis    "joints" (12/25/2017)  . GERD (gastroesophageal reflux disease)   . History of blood transfusion    "related RX I took; made my have my period q 2 wks"  . Migraine    "had one 3 days last week" (12/25/2017)  . Pneumonia    "walking pneumonia one times" (12/25/2017)  . Wears glasses   . Wears partial dentures     Past Surgical History:  Procedure Laterality Date  . APPENDECTOMY  1990  . FEMUR FRACTURE SURGERY Left   . FRACTURE SURGERY    . JOINT REPLACEMENT    . MULTIPLE TOOTH EXTRACTIONS    . SPLENECTOMY, TOTAL  1990  . TOTAL HIP ARTHROPLASTY Left 2016  . WRIST FRACTURE SURGERY Right    "put in titanium rod"    History reviewed. No pertinent family history.  Social History:  reports that she has been smoking cigarettes. She has a 15.00 pack-year smoking history. She has never used smokeless tobacco. She reports that she drank alcohol. She reports that she does not use drugs.  Allergies:  Allergies  Allergen Reactions  . Penicillins Rash    Rash and itching    Medications:  No current facility-administered medications on file prior to encounter.    No current outpatient medications on file prior to encounter.   Currently on #1.  Xanax 1 mg 3 times daily as  needed 2.  Mirtazapine 30 mg nightly 3.  Omeprazole 20 mg daily  ROS: Constitutional: No fever or chills Vision: No changes in vision ENT: No difficulty swallowing CV: No chest pain Pulm: No SOB or wheezing GI: No nausea or vomiting, occasional constipation GU: No urgency or inability to hold urine Skin: No poor wound healing Neurologic: No numbness or tingling Psychiatric: +anxiety Heme: No bruising, + for anemia Allergic: No reaction to medications or food   Exam: Blood pressure 101/72, pulse 94, temperature 98.1 F (36.7 C), temperature source Oral, resp. rate 17, height 5\' 10"  (1.778 m), weight 47.1 kg, SpO2 100 %. General:NAD, thin female Orientation:AAOx3 Mood and  Affect: Cooperative and appropriate affect Gait: Unable to assess due to deformity and pain Coordination and balance: Within normal limits No increased work of breathing, +cough Abdomen soft and nontender CV: Regular rate and rhythm  Injured Extremity (CV, lymph, sensation, reflexes): Left lower extremity reveals a well-healed incision.  No signs of breakdown.  She has an obvious deformity about her left thigh with significant varus with the plate which is palpable underneath her skin.  She is very thin over her leg.  She has limited range of motion of her hip and knee secondary to pain.  Her compartments are soft and compressible.  She has active dorsiflexion plantarflexion of her ankle and great toe with a warm well-perfused foot.  She endorses her sensation to the dorsum and plantar aspect of her foot.  Right lower extremity: Skin without lesions. No tenderness to palpation. Full painless ROM, full strength in each muscle groups without evidence of instability.   Medical Decision Making: Imaging: X-rays of the left femur show a varus mal/nonunion of the periprosthetic femur fracture.  No significant callus formation.  She has failed fixation of her proximal femur fixation.  Labs:  Results for orders placed or performed during the hospital encounter of 12/25/17 (from the past 24 hour(s))  Basic metabolic panel     Status: Abnormal   Collection Time: 12/25/17  8:28 PM  Result Value Ref Range   Sodium 133 (L) 135 - 145 mmol/L   Potassium 4.2 3.5 - 5.1 mmol/L   Chloride 102 98 - 111 mmol/L   CO2 20 (L) 22 - 32 mmol/L   Glucose, Bld 111 (H) 70 - 99 mg/dL   BUN 17 8 - 23 mg/dL   Creatinine, Ser 0.45 0.44 - 1.00 mg/dL   Calcium 8.9 8.9 - 40.9 mg/dL   GFR calc non Af Amer >60 >60 mL/min   GFR calc Af Amer >60 >60 mL/min   Anion gap 11 5 - 15  CBC WITH DIFFERENTIAL     Status: Abnormal   Collection Time: 12/25/17  8:28 PM  Result Value Ref Range   WBC 5.8 4.0 - 10.5 K/uL   RBC 3.76 (L)  3.87 - 5.11 MIL/uL   Hemoglobin 8.8 (L) 12.0 - 15.0 g/dL   HCT 81.1 (L) 91.4 - 78.2 %   MCV 74.2 (L) 78.0 - 100.0 fL   MCH 23.4 (L) 26.0 - 34.0 pg   MCHC 31.5 30.0 - 36.0 g/dL   RDW 95.6 (H) 21.3 - 08.6 %   Platelets 418 (H) 150 - 400 K/uL   Neutrophils Relative % 45 %   Neutro Abs 2.6 1.7 - 7.7 K/uL   Lymphocytes Relative 41 %   Lymphs Abs 2.4 0.7 - 4.0 K/uL   Monocytes Relative 11 %   Monocytes Absolute 0.7 0.1 - 1.0 K/uL  Eosinophils Relative 2 %   Eosinophils Absolute 0.1 0.0 - 0.7 K/uL   Basophils Relative 1 %   Basophils Absolute 0.1 0.0 - 0.1 K/uL   Immature Granulocytes 0 %   Abs Immature Granulocytes 0.0 0.0 - 0.1 K/uL  Protime-INR     Status: Abnormal   Collection Time: 12/25/17 11:16 PM  Result Value Ref Range   Prothrombin Time 15.6 (H) 11.4 - 15.2 seconds   INR 1.25   Type and screen [facility]     Status: None (Preliminary result)   Collection Time: 12/25/17 11:23 PM  Result Value Ref Range   ABO/RH(D) O POS    Antibody Screen NEG    Sample Expiration 12/28/2017    Unit Number [id number]    Blood Component Type RED CELLS,LR    Unit division 00    Status of Unit ALLOCATED    Transfusion Status OK TO TRANSFUSE    Crossmatch Result      Compatible Performed at [facility], [address] [place], [place] [address]    Unit Number [id number]    Blood Component Type RED CELLS,LR    Unit division 00    Status of Unit ALLOCATED    Transfusion Status OK TO TRANSFUSE    Crossmatch Result Compatible   Prepare RBC     Status: None   Collection Time: 12/25/17 11:23 PM  Result Value Ref Range   Order Confirmation      ORDER PROCESSED BY BLOOD BANK Performed at [facility], [address], [place], [place] [address]   ABO/Rh     Status: None   Collection Time: 12/25/17 11:23 PM  Result Value Ref Range   ABO/RH(D)      O POS Performed at [facility], [address], [place], [place] [address]   Surgical pcr  screen     Status: None   Collection Time: 12/26/17 12:18 AM  Result Value Ref Range   MRSA, PCR NEGATIVE NEGATIVE   Staphylococcus aureus NEGATIVE NEGATIVE  Urinalysis, Routine w reflex microscopic     Status: Abnormal   Collection Time: 12/26/17  4:29 AM  Result Value Ref Range   Color, Urine YELLOW YELLOW   APPearance HAZY (A) CLEAR   Specific Gravity, Urine 1.016 1.005 - 1.030   pH 5.0 5.0 - 8.0   Glucose, UA NEGATIVE NEGATIVE mg/dL   Hgb urine dipstick SMALL (A) NEGATIVE   Bilirubin Urine NEGATIVE NEGATIVE   Ketones, ur 5 (A) NEGATIVE mg/dL   Protein, ur NEGATIVE NEGATIVE mg/dL   Nitrite POSITIVE (A) NEGATIVE   Leukocytes, UA LARGE (A) NEGATIVE   RBC / HPF 11-20 0 - 5 RBC/hpf   WBC, UA >50 (H) 0 - 5 WBC/hpf   Bacteria, UA MANY (A) NONE SEEN   WBC Clumps PRESENT    Mucus PRESENT    Hyaline Casts, UA PRESENT   Comprehensive metabolic panel     Status: Abnormal   Collection Time: 12/26/17  5:37 AM  Result Value Ref Range   Sodium 136 135 - 145 mmol/L   Potassium 3.8 3.5 - 5.1 mmol/L   Chloride 107 98 - 111 mmol/L   CO2 20 (L) 22 - 32 mmol/L   Glucose, Bld 80 70 - 99 mg/dL   BUN 14 8 - 23 mg/dL   Creatinine, Ser 8.41 0.44 - 1.00 mg/dL   Calcium 8.7 (L) 8.9 - 10.3 mg/dL   Total Protein 5.7 (L) 6.5 -  8.1 g/dL   Albumin 2.6 (L) 3.5 - 5.0 g/dL   AST 24 15 - 41 U/L   ALT 18 0 - 44 U/L   Alkaline Phosphatase 64 38 - 126 U/L   Total Bilirubin 0.3 0.3 - 1.2 mg/dL   GFR calc non Af Amer >60 >60 mL/min   GFR calc Af Amer >60 >60 mL/min   Anion gap 9 5 - 15  CBC     Status: Abnormal   Collection Time: 12/26/17  5:37 AM  Result Value Ref Range   WBC 4.7 4.0 - 10.5 K/uL   RBC 3.70 (L) 3.87 - 5.11 MIL/uL   Hemoglobin 8.6 (L) 12.0 - 15.0 g/dL   HCT 16.1 (L) 09.6 - 04.5 %   MCV 74.3 (L) 78.0 - 100.0 fL   MCH 23.2 (L) 26.0 - 34.0 pg   MCHC 31.3 30.0 - 36.0 g/dL   RDW 40.9 (H) 81.1 - 91.4 %   Platelets 423 (H) 150 - 400 K/uL  APTT     Status: Abnormal   Collection Time:  12/26/17  5:37 AM  Result Value Ref Range   aPTT 44 (H) 24 - 36 seconds  TSH     Status: Abnormal   Collection Time: 12/26/17  5:37 AM  Result Value Ref Range   TSH 0.050 (L) 0.350 - 4.500 uIU/mL  Magnesium     Status: None   Collection Time: 12/26/17  5:37 AM  Result Value Ref Range   Magnesium 1.7 1.7 - 2.4 mg/dL  Phosphorus     Status: None   Collection Time: 12/26/17  5:37 AM  Result Value Ref Range   Phosphorus 3.8 2.5 - 4.6 mg/dL  Hemoglobin N8G     Status: Abnormal   Collection Time: 12/26/17  5:37 AM  Result Value Ref Range   Hgb A1c MFr Bld 7.2 (H) 4.8 - 5.6 %   Mean Plasma Glucose 159.94 mg/dL  Prealbumin     Status: None   Collection Time: 12/26/17  5:37 AM  Result Value Ref Range   Prealbumin 22.6 18 - 38 mg/dL   Medical history and chart was reviewed  Assessment/Plan: 62 year old female with a history of COPD, significant smoking history, and previous history of alcohol use with a left femur malunion/nonunion status post ORIF  Multiple labs were obtained upon arrival.  The patient appears to have a newly diagnosed diabetes with a hemoglobin of A1c of 7.2.  She also has a significant malnutrition with an albumin of 2.6.  She also has chronic anemia with a hemoglobin of 8.6 this morning.   The patient has a significant deformity and will need surgical repair of her nonunion.  This will include a removal of all hardware with realignment of the fracture and revision ORIF.  I discussed with her the risks and benefits of the surgery. Risks discussed included bleeding requiring blood transfusion, bleeding causing a hematoma, infection, malunion, nonunion, damage to surrounding nerves and blood vessels, pain, hardware prominence or irritation, hardware failure, stiffness, post-traumatic arthritis, DVT/PE, compartment syndrome, and even death.  Patient will need optimization of her medical issues including newly diagnosed diabetes, malnutrition as well as a work-up of her low  TSH.  I will also likely institute a acute bone stimulator to assist with healing of her fracture.  Greater than 80 min spend coordinating care with primary team, Dr. Carola Frost, reviewing imaging, ordering and reviewing lab results, examining patient and discussing risks and benefits of proceeding with surgical fixation.  Roby Lofts, MD [facility] [phone] (phone)

## 2017-12-26 NOTE — Anesthesia Procedure Notes (Signed)
Procedure Name: Intubation Date/Time: 12/26/2017 11:32 AM Performed by: Neldon Newport, CRNA Pre-anesthesia Checklist: Timeout performed, Patient being monitored, Suction available, Emergency Drugs available and Patient identified Patient Re-evaluated:Patient Re-evaluated prior to induction Oxygen Delivery Method: Circle system utilized Preoxygenation: Pre-oxygenation with 100% oxygen Induction Type: IV induction Ventilation: Mask ventilation without difficulty Laryngoscope Size: Mac and 3 Grade View: Grade I Tube type: Oral Tube size: 7.0 mm Number of attempts: 1 Placement Confirmation: breath sounds checked- equal and bilateral,  positive ETCO2 and ETT inserted through vocal cords under direct vision Secured at: 21 cm Tube secured with: Tape Dental Injury: Teeth and Oropharynx as per pre-operative assessment

## 2017-12-26 NOTE — Progress Notes (Signed)
Initial Nutrition Assessment  DOCUMENTATION CODES:   Underweight  INTERVENTION:   -Once diet is advanced, add:   -Ensure Enlive po BID, each supplement provides 350 kcal and 20 grams of protein -MVI with minerals daily -Magic Cup BID with meals, each supplement provides 290 kcals and 9 grams protein  NUTRITION DIAGNOSIS:   Increased nutrient needs related to post-op healing as evidenced by estimated needs.  GOAL:   Patient will meet greater than or equal to 90% of their needs  MONITOR:   PO intake, Supplement acceptance, Diet advancement, Labs, Weight trends, Skin, I & O's  REASON FOR ASSESSMENT:   Consult Assessment of nutrition requirement/status  ASSESSMENT:   Melanie Parsons is a 62 y.o. female with medical history significant of anxiety disorder, GERD, osteoarthritis, previous history of alcoholism who had peri-prostatic femural fracture last November with repair.  Patient has been doing fine until the last few days when she has had significant pain in the left hip.  She apparently had the nails in the area come loose.  She was seen by orthopedic surgery at [facility] where she had the original surgery done.  Patient was sent over here for repair tomorrow.  She was sent as a direct admit.  No history was sent with the patient.  No accepting physician on record.  History is therefore obtained from the patient indicating that she has been set up for possible surgery tomorrow.  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   Pt NPO for repair of nonunion of lt femur.   Attempted to speak with pt, however, pt was being transferred down to OR at time of visit. No family present in room. Pt appeared very thin and small framed, but unable to perform nutrition-focused physical exam at this time.   No wt hx available to assess weight changes.   Per chart review, pt with  decreased oral intake PTA, consuming mainly chocolate milk and honey buns.   Albumin has a half-life of 21 days and is strongly affected by stress response and inflammatory process, therefore, do not expect to see an improvement in this lab value during acute hospitalization. When a patient presents with low albumin, it is likely skewed due to the acute inflammatory response.   Note that low albumin is no longer used to diagnose malnutrition; [place] uses the new malnutrition guidelines published by the American Society for Parenteral and Enteral Nutrition (A.S.P.E.N.) and the Academy of Nutrition and Dietetics (AND).    Highly suspect malnutrition, however, unable to identify at this time. Pt would greatly benefit from oral nutrition supplement, which RD will order once diet is advanced.   Medications reviewed and include remeron.   Last Hgb A1c: 7.2 (12/26/17). PTA DM medications are .   Labs reviewed: no CBGS ordered (inpatient orders for glycemic control are inpatient orders for glycemic control are 0-15 units insulin aspart TID with meals).   NUTRITION - FOCUSED PHYSICAL EXAM:    Most Recent Value  Orbital Region  Unable to assess  Upper Arm Region  Unable to assess  Thoracic and Lumbar Region  Unable to assess  Buccal Region  Unable to assess  Temple Region  Unable to assess  Clavicle Bone Region  Unable to assess  Clavicle and Acromion Bone Region  Unable to assess  Scapular Bone Region  Unable to assess  Dorsal Hand  Unable  to assess  Patellar Region  Unable to assess  Anterior Thigh Region  Unable to assess  Posterior Calf Region  Unable to assess  Edema (RD Assessment)  Unable to assess  Hair  Unable to assess  Eyes  Unable to assess  Mouth  Unable to assess  Skin  Unable to assess  Nails  Unable to assess       Diet Order:   Diet Order            Diet NPO time specified  Diet effective midnight              EDUCATION NEEDS:   Not appropriate for  education at this time  Skin:  Skin Assessment: Reviewed RN Assessment  Last BM:  12/24/17  Height:   Ht Readings from Last 1 Encounters:  12/25/17 5\' 10"  (1.778 m)    Weight:   Wt Readings from Last 1 Encounters:  12/25/17 47.1 kg    Ideal Body Weight:  72.7 kg  BMI:  Body mass index is 14.9 kg/m.  Estimated Nutritional Needs:   Kcal:  1650-1850  Protein:  85-100 grams  Fluid:  > 1.6 L    Melanie Parsons A. Mayford Knife, RD, LDN, CDE Pager: [phone] After hours Pager: [phone]

## 2017-12-26 NOTE — Progress Notes (Signed)
Report called to Bonnie, RN in short stay.  

## 2017-12-26 NOTE — Transfer of Care (Signed)
Immediate Anesthesia Transfer of Care Note  Patient: Melanie Parsons  Procedure(s) Performed: OPEN REDUCTION INTERNAL FIXATION (ORIF) PERIPROSTHETIC FRACTURE (Left Hip) HARDWARE REMOVAL (Left Leg Upper)  Patient Location: PACU  Anesthesia Type:General  Level of Consciousness: awake, alert  and oriented  Airway & Oxygen Therapy: Patient Spontanous Breathing and Patient connected to face mask oxygen  Post-op Assessment: Report given to RN, Post -op Vital signs reviewed and stable and Patient moving all extremities X 4  Post vital signs: Reviewed and stable  Last Vitals:  Vitals Value Taken Time  BP    Temp    Pulse 103 12/26/2017  2:56 PM  Resp 24 12/26/2017  2:56 PM  SpO2 98 % 12/26/2017  2:56 PM  Vitals shown include unvalidated device data.  Last Pain:  Vitals:   12/26/17 0442  TempSrc: Oral  PainSc:          Complications: No apparent anesthesia complications

## 2017-12-26 NOTE — Op Note (Signed)
OrthopaedicSurgeryOperativeNote (VEH:[id number]) Date of Surgery: 12/26/2017  Admit Date: 12/25/2017   Diagnoses: Pre-Op Diagnoses: Left periprosthetic femur mal/nonunion  Post-Op Diagnosis: Same  Procedures: 1. CPT 27470-Repair left femur nonunion 2. CPT 20680-Removal of hardware left femur  Surgeons: Primary: Roby Lofts, MD   Location:[facility] OR ROOM 07   AnesthesiaGeneral   Antibiotics:Ancef 2g preop   Tourniquettime:* No tourniquets in log * .  EstimatedBloodLoss:150 mL   Complications:None  Specimens: ID Type Source Tests Collected by Time Destination  A : left femur non-union site Tissue Soft Tissue, Other AEROBIC/ANAEROBIC CULTURE (SURGICAL/DEEP WOUND) Jalynne Persico, Gillie Manners, MD 12/26/2017 1221     Implants: Implant Name Type Inv. Item Serial No. Manufacturer Lot No. LRB No. Used Action  CABLE CERLAGE W/CRIMP 1.8MM - [id number] Cable CABLE CERLAGE W/CRIMP 1.8MM  [employer](ORTH,TRAU,BIO,SG) [id number] Left 1 Implanted  LOCKPLATE CABLE BUTTON NCP HIP - [id number] Orthopedic Implant LOCKPLATE CABLE BUTTON NCP HIP  [employer](ORTH,TRAU,BIO,SG) [id number] Left 1 Implanted  PLATE LT PROX FEMUR 12H - [id number] Plate PLATE LT PROX FEMUR 12H  [employer](ORTH,TRAU,BIO,SG)  Left 1 Implanted  SCREW NCB 4.0MX34M - [id number] Screw SCREW NCB 4.0MX34M  [employer](ORTH,TRAU,BIO,SG)  Left 4 Implanted  SCREW NCB 4.5TS17B - [id number] Screw SCREW NCB 4.3RA07M  [employer](ORTH,TRAU,BIO,SG)  Left 1 Implanted  SCREW NCB 4.0 - [id number] Screw SCREW NCB 4.0  [employer](ORTH,TRAU,BIO,SG)  Left 1 Implanted  SCREW NCB 4.0MX42M - [id number] Screw SCREW NCB 4.0MX42M  [employer](ORTH,TRAU,BIO,SG)  Left 2 Implanted  SCREW NCB 4.0X36MM - [id number] Screw SCREW NCB 4.0X36MM  [employer](ORTH,TRAU,BIO,SG)  Left 2 Implanted  SCREW NCB 4.0MX38M - [id number] Screw SCREW NCB 4.0MX38M  [employer](ORTH,TRAU,BIO,SG)  Left 1 Implanted  SCREW 5.0 - [id number] Screw SCREW 5.0   [employer](ORTH,TRAU,BIO,SG)  Left 1 Implanted  CAP LOCK NCB - [id number] Cap CAP LOCK NCB  [employer](ORTH,TRAU,BIO,SG)  Left 9 Implanted    IndicationsforSurgery: 62 year old female with a history of COPD, significant smoking history, and previous history of alcohol use with a left femur malunion/nonunion status post ORIF in November 2018. Multiple labs were obtained upon arrival.  The patient appeared to have a newly diagnosed diabetes with a hemoglobin of A1c of 7.2.  She also has a significant malnutrition with an albumin of 2.6.  The patient has a significant deformity and will need surgical repair of her nonunion to allow her to ambulate.  This will include a removal of all hardware with realignment of the fracture and revision ORIF.  I discussed with her the risks and benefits of the surgery. Risks discussed included bleeding requiring blood transfusion, bleeding causing a hematoma, infection, malunion, nonunion, damage to surrounding nerves and blood vessels, pain, hardware prominence or irritation, hardware failure, stiffness, post-traumatic arthritis, DVT/PE, compartment syndrome, and even death.  Operative Findings: 1.  Left femoral mal/nonunion with stripped devitalized cortical fragments that were debrided.  Fibrous tissue at the nonunion site and sent for culture. 2.  Removal of hardware from previous ORIF. 3.  Revision open reduction internal fixation of left nonunion site with good cortical overlap posterior and anterior cortex.  Improved coronal alignment and fixation with 12 hole NCB proximal femur plate.  Procedure: The patient was identified in the preoperative holding area. Consent was confirmed with the patient and their family and all questions were answered. The operative extremity was marked after confirmation with the patient. she was then brought back to the operating room by our anesthesia colleagues.  She was placed under general anesthetic and  carefully transferred  over to a radiolucent flat top table.  A bump was placed under her operative hip. The operative extremity was then prepped and draped in usual sterile fashion. A preoperative timeout was performed to verify the patient, the procedure, and the extremity. Preoperative antibiotics were dosed.  Fluoroscopic images were obtained to show the significant instability of her nonunion site.  Using her previous incision opened up her skin and subcutaneous tissue exposing the IT band.  The IT band was split in line with my incision.  Carried this all the way down from the tip of her previous incision all the way up to her greater trochanter.  I then reflected the vastus lateralis off the posterior intermuscular septum identified and cauterized all the perforators.  Here I was able to expose the previous stainless steel Stryker plate.  The locking screws into the distal shaft were removed successfully without difficulty.  The 4 cables that were in place were cut and removed without any problems.  The proximal portion of the plate had no fixation so the plate was then removed.  I then proceeded to expose the nonunion site and debride the site.  I used a curette and rondure to debride all devitalized cortical fragments.  There was a 2 fragments that were completely devoid of all soft tissue and appeared to be dead.  There also appeared to be some allograft that was in the nonunion site that there was debrided.  The proximal and distal fragments appeared to be sclerotic.  I attempted to the use a osteotome to fishtail the ends of the bone to provide some increased surface area and bleeding.  I then took the debrided tissue and sent this for culture.  I then used a low pressure pulsatile lavage to irrigate the wound with approximately 3 L of normal saline.  I changed my gloves and then started in the fixation portion of the procedure.  I used a 12 hole NCB proximal femur plate.  I contoured this slightly to put it in a small  amount of valgus to prevent the fracture from going back into varus.  I provisionally pinned the proximal portion of the plate against the femur.  The distal portion was aligned with the clamps and I drilled and placed 4.0 mm nonlocking screw in the distal shaft.  I was able to get bicortical fixation of the proximal shaft around the stem.  A total of 5 screws were placed in the proximal shaft.  4 screws were placed in the distal shaft with 1 of them a 5.0 millimeter screw.  To reinforce the fixation proximally I placed a cable around the femur and locked it into the plate with a cable button.  Final fluoroscopic images were obtained.  I had good cortical contact anteriorly and posteriorly with the fracture.  Felt that acute allografting or BMP was not indicated.  I did not feel that any other location would yield a significant amount of autograft.  As result I felt that the cortical contact was sufficient to allow for potential healing.  The incision was then thoroughly irrigated.  A gram of vancomycin powder 1.2 g of tobramycin powder were placed in the wound.  The IT band was closed with 0 PDS suture.  The skin was closed with 2-0 Monocryl and 3-0 nylon.  A sterile Aquacel dressing was placed.  She was then awoken from anesthesia and taken to PACU in stable condition.  Post Op Plan/Instructions: Patient will be touchdown weightbearing  to the left lower extremity.  She will receive postoperative Ancef.  She will receive Lovenox for DVT prophylaxis to start postoperative day 1.  We will optimize her nutrition.  She has been encouraged to quit smoking.  We will institute a bone stimulator.  I was present and performed the entire surgery.  Truitt Merle, MD [facility]

## 2017-12-26 NOTE — Anesthesia Preprocedure Evaluation (Addendum)
Anesthesia Evaluation  Patient identified by MRN, date of birth, ID band Patient awake    Reviewed: Allergy & Precautions, NPO status , Patient's Chart, lab work & pertinent test results  Airway Mallampati: II  TM Distance: >3 FB Neck ROM: Full    Dental no notable dental hx. (+) Dental Advidsory Given, Partial Upper   Pulmonary COPD, Current Smoker,    Pulmonary exam normal breath sounds clear to auscultation       Cardiovascular hypertension, Normal cardiovascular exam Rhythm:Regular Rate:Normal     Neuro/Psych  Headaches, Anxiety negative neurological ROS     GI/Hepatic Neg liver ROS, GERD  ,  Endo/Other  diabetes  Renal/GU negative Renal ROS  negative genitourinary   Musculoskeletal negative musculoskeletal ROS (+)   Abdominal   Peds negative pediatric ROS (+)  Hematology negative hematology ROS (+)   Anesthesia Other Findings   Reproductive/Obstetrics negative OB ROS                            Anesthesia Physical Anesthesia Plan  ASA: III  Anesthesia Plan: General   Post-op Pain Management:    Induction: Intravenous  PONV Risk Score and Plan: 3 and Ondansetron, Dexamethasone and Treatment may vary due to age or medical condition  Airway Management Planned: Oral ETT  Additional Equipment:   Intra-op Plan:   Post-operative Plan: Extubation in OR  Informed Consent: I have reviewed the patients History and Physical, chart, labs and discussed the procedure including the risks, benefits and alternatives for the proposed anesthesia with the patient or authorized representative who has indicated his/her understanding and acceptance.   Dental advisory given and Dental Advisory Given  Plan Discussed with: CRNA and Surgeon  Anesthesia Plan Comments:        Anesthesia Quick Evaluation

## 2017-12-26 NOTE — Progress Notes (Signed)
PROGRESS NOTE                                                                                                                                                                                                             Patient Demographics:    Melanie Parsons, is a 62 y.o. female, DOB - 03/03/1956, WUJ:[id number]  Admit date - 12/25/2017   Admitting Physician Barnetta Chapel, MD  Outpatient Primary MD for the patient is [name], MD  LOS - 1  Outpatient Specialists:  No chief complaint on file.      Brief Narrative   63 year old female with history of anxiety, GERD, osteoarthritis, left periprosthetic femur fracture status post ORIF November 2018.  She had left hemiarthroplasty in 2016 from which she was recovering and ambulating well but fell and broke her femur distal to the prosthesis.  After fixing this he was doing touchdown weightbearing for about 3 months and started to ambulate.  However she started having pain.  For past 2 weeks she noticed significant deformity of her leg with increasing pain.  She was seen by her surgeon Dr. [name] who referred her to [facility] for repair of the malunion. Patient underwent removal of left femur hardware and repair of the nonunion.   Subjective:   Seen in PACU. Feels tired.   Denies acute symptoms apart from pain that is tolerable.   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  Pain control with PRN hydrocodone.  PT in a.m.  Bowel regimen added.  DVT prophylaxis per surgery.  Active Problems:   COPD (chronic obstructive pulmonary disease) (HCC) Stable.  Resume home inhalers  Essential hypertension Stable.  Resume home medications  Generalized anxiety disorder Continue Xanax  Chronic depression Stable.  Continue Remeron  GERD Continue  PPI  Diabetes mellitus type 2 Not on meds.  Monitor on sliding scale coverage.  A1c of 7.2.  Hypothyroidism Has suppressed both TSH and free T4.  No acute symptoms.  Outpatient monitoring.  Code Status : Full code  Family Communication  : None at bedside  Disposition Plan  : Pending postop course.  Possibly SNF  Barriers For Discharge : Postop  Consults  : Ortho (  Dr Jena Gauss)  Procedures  : ORIF left hip  DVT Prophylaxis  :  Lovenox -   Lab Results  Component Value Date   PLT 423 (H) 12/26/2017    Antibiotics  :   Anti-infectives (From admission, onward)   Start     Dose/Rate Route Frequency Ordered Stop   12/26/17 1459  tobramycin (NEBCIN) powder  Status:  Discontinued       As needed 12/26/17 1459 12/26/17 1500   12/26/17 1400  vancomycin (VANCOCIN) powder  Status:  Discontinued       As needed 12/26/17 1458 12/26/17 1500   12/26/17 1000  [MAR Hold]  cefTRIAXone (ROCEPHIN) 1 g in sodium chloride 0.9 % 100 mL IVPB     (MAR Hold since Wed 12/26/2017 at 1006. Reason: Transfer to a Procedural area.)   1 g 200 mL/hr over 30 Minutes Intravenous Every 24 hours 12/26/17 0905     12/26/17 0800  ceFAZolin (ANCEF) powder 2 g  Status:  Discontinued    Note to Pharmacy:  Give test dose in holding area   2 g Other To Surgery 12/25/17 2227 12/25/17 2244   12/26/17 0800  [MAR Hold]  ceFAZolin (ANCEF) IVPB 2g/100 mL premix     (MAR Hold since Wed 12/26/2017 at 1006. Reason: Transfer to a Procedural area.)  Note to Pharmacy:  Give test dose in holding area   2 g 200 mL/hr over 30 Minutes Intravenous To Surgery 12/25/17 2244 12/26/17 1125        Objective:   Vitals:   12/26/17 0442 12/26/17 1457 12/26/17 1512 12/26/17 1545  BP: 101/72 111/76 106/71 102/66  Pulse: 94 (!) 103 94 86  Resp: 17 (!) 24 20 19   Temp: 98.1 F (36.7 C) 98.1 F (36.7 C)    TempSrc: Oral     SpO2: 100% 98% 100% 93%  Weight:      Height:        Wt Readings from Last 3 Encounters:  12/25/17 47.1 kg      Intake/Output Summary (Last 24 hours) at 12/26/2017 1644 Last data filed at 12/26/2017 1457 Gross per 24 hour  Intake 2194.72 ml  Output 900 ml  Net 1294.72 ml     Physical Exam  Gen: not in distress HEENT: moist mucosa, supple neck Chest: clear b/l, no added sounds CVS: N S1&S2, no murmurs,  GI: soft, NT, ND, BS+ Musculoskeletal: warm, no edema, dressing over left hip     Data Review:    CBC Recent Labs  Lab 12/25/17 2028 12/26/17 0537  WBC 5.8 4.7  HGB 8.8* 8.6*  HCT 27.9* 27.5*  PLT 418* 423*  MCV 74.2* 74.3*  MCH 23.4* 23.2*  MCHC 31.5 31.3  RDW 19.9* 20.1*  LYMPHSABS 2.4  --   MONOABS 0.7  --   EOSABS 0.1  --   BASOSABS 0.1  --     Chemistries  Recent Labs  Lab 12/25/17 2028 12/26/17 0537  NA 133* 136  K 4.2 3.8  CL 102 107  CO2 20* 20*  GLUCOSE 111* 80  BUN 17 14  CREATININE 0.61 0.50  CALCIUM 8.9 8.7*  MG  --  1.7  AST  --  24  ALT  --  18  ALKPHOS  --  64  BILITOT  --  0.3   ------------------------------------------------------------------------------------------------------------------ No results for input(s): CHOL, HDL, LDLCALC, TRIG, CHOLHDL, LDLDIRECT in the last 72 hours.  Lab Results  Component Value Date   HGBA1C 7.2 (H) 12/26/2017   ------------------------------------------------------------------------------------------------------------------  Recent Labs    12/26/17 0537  TSH 0.050*   ------------------------------------------------------------------------------------------------------------------ No results for input(s): VITAMINB12, FOLATE, FERRITIN, TIBC, IRON, RETICCTPCT in the last 72 hours.  Coagulation profile Recent Labs  Lab 12/25/17 2316  INR 1.25    No results for input(s): DDIMER in the last 72 hours.  Cardiac Enzymes No results for input(s): CKMB, TROPONINI, MYOGLOBIN in the last 168 hours.  Invalid input(s):  CK ------------------------------------------------------------------------------------------------------------------ No results found for: BNP  Inpatient Medications  Scheduled Meds: . [START ON 12/27/2017] feeding supplement (ENSURE ENLIVE)  237 mL Oral BID BM  . [MAR Hold] insulin aspart  0-15 Units Subcutaneous TID WC  . [MAR Hold] mirtazapine  30 mg Oral QHS  . [START ON 12/27/2017] multivitamin with minerals  1 tablet Oral Daily  . [MAR Hold] pantoprazole  40 mg Oral Daily   Continuous Infusions: . sodium chloride 100 mL/hr at 12/26/17 0620  . [MAR Hold] cefTRIAXone (ROCEPHIN)  IV 1 g (12/26/17 0951)   PRN Meds:.[MAR Hold] ALPRAZolam, [MAR Hold] HYDROcodone-acetaminophen, HYDROmorphone (DILAUDID) injection, [MAR Hold] ondansetron **OR** [MAR Hold] ondansetron (ZOFRAN) IV, promethazine  Micro Results Recent Results (from the past 240 hour(s))  Surgical pcr screen     Status: None   Collection Time: 12/26/17 12:18 AM  Result Value Ref Range Status   MRSA, PCR NEGATIVE NEGATIVE Final   Staphylococcus aureus NEGATIVE NEGATIVE Final    Comment: (NOTE) The Xpert SA Assay (FDA approved for NASAL specimens in patients 49 years of age and older), is one component of a comprehensive surveillance program. It is not intended to diagnose infection nor to guide or monitor treatment. Performed at [facility], [address] [place], [place] [address]   Aerobic/Anaerobic Culture (surgical/deep wound)     Status: None (Preliminary result)   Collection Time: 12/26/17 12:21 PM  Result Value Ref Range Status   Specimen Description TISSUE LEFT LEG FEMUR NON UNION SITE  Final   Special Requests PATIENT ON FOLLOWING ANCEF  Final   Gram Stain   Final    FEW WBC PRESENT, PREDOMINANTLY PMN NO ORGANISMS SEEN Performed at [facility], [address] [place], [place] [address]    Culture PENDING  Incomplete   Report Status PENDING  Incomplete    Radiology Reports Dg C-arm 1-60  Min  Result Date: 12/26/2017 CLINICAL DATA:  Revision of left femur plate and screws with cable. EXAM: LEFT FEMUR 2 VIEWS; DG C-ARM 61-120 MIN 169 seconds under fluoroscopy. COMPARISON:  None. FINDINGS: Patient status post revision of left femoral plate and screws. At the end of the procedure, a orthopedic side plate fixated by horizontal screws is identified in the femoral shaft through fracture of the mid femoral shaft. No malalignment is identified. Left femoral hemiarthroplasty is identified. IMPRESSION: Revision of the left femur without malalignment as described. Electronically Signed   By: Sherian Rein M.D.   On: 12/26/2017 15:22   Dg C-arm 1-60 Min  Result Date: 12/26/2017 CLINICAL DATA:  Revision of left femur plate and screws with cable. EXAM: LEFT FEMUR 2 VIEWS; DG C-ARM 61-120 MIN 169 seconds under fluoroscopy. COMPARISON:  None. FINDINGS: Patient status post revision of left femoral plate and screws. At the end of the procedure, a orthopedic side plate fixated by horizontal screws is identified in the femoral shaft through fracture of the mid femoral shaft. No malalignment is identified. Left femoral hemiarthroplasty is identified. IMPRESSION: Revision of the left femur without malalignment as described. Electronically Signed  By: Sherian Rein M.D.   On: 12/26/2017 15:22   Dg C-arm 1-60 Min  Result Date: 12/26/2017 CLINICAL DATA:  Revision of left femur plate and screws with cable. EXAM: LEFT FEMUR 2 VIEWS; DG C-ARM 61-120 MIN 169 seconds under fluoroscopy. COMPARISON:  None. FINDINGS: Patient status post revision of left femoral plate and screws. At the end of the procedure, a orthopedic side plate fixated by horizontal screws is identified in the femoral shaft through fracture of the mid femoral shaft. No malalignment is identified. Left femoral hemiarthroplasty is identified. IMPRESSION: Revision of the left femur without malalignment as described. Electronically Signed   By: Sherian Rein M.D.   On: 12/26/2017 15:22   Dg Femur Min 2 Views Left  Result Date: 12/26/2017 CLINICAL DATA:  Revision of left femur plate and screws with cable. EXAM: LEFT FEMUR 2 VIEWS; DG C-ARM 61-120 MIN 169 seconds under fluoroscopy. COMPARISON:  None. FINDINGS: Patient status post revision of left femoral plate and screws. At the end of the procedure, a orthopedic side plate fixated by horizontal screws is identified in the femoral shaft through fracture of the mid femoral shaft. No malalignment is identified. Left femoral hemiarthroplasty is identified. IMPRESSION: Revision of the left femur without malalignment as described. Electronically Signed   By: Sherian Rein M.D.   On: 12/26/2017 15:22   Dg Femur Port Min 2 Views Left  Result Date: 12/25/2017 CLINICAL DATA:  Femur fracture EXAM: LEFT FEMUR PORTABLE 2 VIEWS COMPARISON:  02/26/2017 FINDINGS: Previous left hip replacement with normal alignment, screw, and cerclage fixation of the femoral shaft. Comminuted fracture at the proximal shaft of the femur at the site of prior fracture with new moderate varus angulation of the distal femur. Up lifting of the proximal fixating plate from the cortex of the femur with screw fragment separated from the fixating plate. Additional faint linear lucency within the distal shaft of the femur just caudal to the fixating plate. Stable sclerotic focus in the distal shaft of the femur. IMPRESSION: 1. Prior left hip replacement and surgical plate, screw and cerclage wire fixation of the shaft of the femur. Comminuted fracture at the proximal shaft of the femur in the region of the previous fracture, now with new moderate varus angulation of the distal femur 2. Interim finding of displacement of the proximal portion of the fixating plate and screws from the shaft of the femur. Electronically Signed   By: Jasmine Pang M.D.   On: 12/25/2017 23:41    Time Spent in minutes  25   Nicci Vaughan M.D on 12/26/2017 at 4:44  PM  Between 7am to 7pm - Pager - [phone]  After 7pm go to [email or web address] - password [facility]  [facility] -  Office  [phone]

## 2017-12-27 ENCOUNTER — Encounter (HOSPITAL_COMMUNITY): Payer: Self-pay | Admitting: Orthopedic Surgery

## 2017-12-27 ENCOUNTER — Inpatient Hospital Stay (HOSPITAL_COMMUNITY): Payer: BLUE CROSS/BLUE SHIELD

## 2017-12-27 DIAGNOSIS — N390 Urinary tract infection, site not specified: Secondary | ICD-10-CM

## 2017-12-27 DIAGNOSIS — D509 Iron deficiency anemia, unspecified: Secondary | ICD-10-CM

## 2017-12-27 DIAGNOSIS — M79609 Pain in unspecified limb: Secondary | ICD-10-CM

## 2017-12-27 DIAGNOSIS — M7989 Other specified soft tissue disorders: Secondary | ICD-10-CM

## 2017-12-27 HISTORY — DX: Urinary tract infection, site not specified: N39.0

## 2017-12-27 LAB — IRON AND TIBC
IRON: 18 ug/dL — AB (ref 28–170)
Saturation Ratios: 5 % — ABNORMAL LOW (ref 10.4–31.8)
TIBC: 340 ug/dL (ref 250–450)
UIBC: 322 ug/dL

## 2017-12-27 LAB — GLUCOSE, CAPILLARY
Glucose-Capillary: 81 mg/dL (ref 70–99)
Glucose-Capillary: 68 mg/dL — ABNORMAL LOW (ref 70–99)
Glucose-Capillary: 87 mg/dL (ref 70–99)
Glucose-Capillary: 136 mg/dL — ABNORMAL HIGH (ref 70–99)
Glucose-Capillary: 97 mg/dL (ref 70–99)

## 2017-12-27 LAB — BASIC METABOLIC PANEL
ANION GAP: 7 (ref 5–15)
BUN: 8 mg/dL (ref 8–23)
CHLORIDE: 102 mmol/L (ref 98–111)
CO2: 23 mmol/L (ref 22–32)
Calcium: 8.6 mg/dL — ABNORMAL LOW (ref 8.9–10.3)
Creatinine, Ser: 0.48 mg/dL (ref 0.44–1.00)
GFR calc non Af Amer: >60 mL/min (ref >60)
GLUCOSE: 70 mg/dL (ref 70–99)
Potassium: 4 mmol/L (ref 3.5–5.1)
Sodium: 132 mmol/L — ABNORMAL LOW (ref 135–145)

## 2017-12-27 LAB — PTH, INTACT AND CALCIUM
CALCIUM TOTAL (PTH): 8.6 mg/dL — AB (ref 8.7–10.3)
PTH: 22 pg/mL (ref 15–65)

## 2017-12-27 LAB — CBC
HEMATOCRIT: 26.7 % — AB (ref 36.0–46.0)
HEMOGLOBIN: 8.2 g/dL — AB (ref 12.0–15.0)
MCH: 23.1 pg — ABNORMAL LOW (ref 26.0–34.0)
MCHC: 30.7 g/dL (ref 30.0–36.0)
MCV: 75.2 fL — AB (ref 78.0–100.0)
Platelets: 414 10*3/uL — ABNORMAL HIGH (ref 150–400)
RBC: 3.55 MIL/uL — ABNORMAL LOW (ref 3.87–5.11)
RDW: 20.1 % — ABNORMAL HIGH (ref 11.5–15.5)
WBC: 8.2 10*3/uL (ref 4.0–10.5)

## 2017-12-27 LAB — CALCIUM, IONIZED: Calcium, Ionized, Serum: 5 mg/dL (ref 4.5–5.6)

## 2017-12-27 LAB — T3, FREE: T3, Free: 2.6 pg/mL (ref 2.0–4.4)

## 2017-12-27 LAB — FERRITIN: Ferritin: 11 ng/mL (ref 11–307)

## 2017-12-27 LAB — VITAMIN D 25 HYDROXY (VIT D DEFICIENCY, FRACTURES): VIT D 25 HYDROXY: 18.5 ng/mL — AB (ref 30.0–100.0)

## 2017-12-27 LAB — CALCITRIOL (1,25 DI-OH VIT D): Vit D, 1,25-Dihydroxy: 7.4 pg/mL — ABNORMAL LOW (ref 19.9–79.3)

## 2017-12-27 MED ORDER — HYDROMORPHONE HCL 1 MG/ML IJ SOLN
0.5000 mg | INTRAMUSCULAR | Status: DC | PRN
  Administered 2017-12-27 (×2): 0.5 mg via INTRAVENOUS
  Filled 2017-12-27 (×2): qty 1

## 2017-12-27 MED ORDER — METHOCARBAMOL 500 MG PO TABS
500.0000 mg | ORAL_TABLET | Freq: Three times a day (TID) | ORAL | Status: DC | PRN
  Administered 2017-12-27: 500 mg via ORAL
  Filled 2017-12-27: qty 1

## 2017-12-27 MED ORDER — ACETAMINOPHEN 500 MG PO TABS
500.0000 mg | ORAL_TABLET | Freq: Three times a day (TID) | ORAL | Status: DC
  Administered 2017-12-27 – 2017-12-30 (×7): 500 mg via ORAL
  Filled 2017-12-27 (×7): qty 1

## 2017-12-27 MED ORDER — HYDROCODONE-ACETAMINOPHEN 7.5-325 MG PO TABS
1.0000 | ORAL_TABLET | Freq: Three times a day (TID) | ORAL | Status: DC | PRN
  Administered 2017-12-27 – 2017-12-29 (×5): 1 via ORAL
  Filled 2017-12-27 (×5): qty 1

## 2017-12-27 MED ORDER — VITAMIN D 1000 UNITS PO TABS
2000.0000 [IU] | ORAL_TABLET | Freq: Two times a day (BID) | ORAL | Status: DC
  Administered 2017-12-27 – 2017-12-30 (×7): 2000 [IU] via ORAL
  Filled 2017-12-27 (×7): qty 2

## 2017-12-27 MED ORDER — BOOST / RESOURCE BREEZE PO LIQD CUSTOM
1.0000 | Freq: Three times a day (TID) | ORAL | Status: DC
  Administered 2017-12-28 – 2017-12-29 (×2): 1 via ORAL

## 2017-12-27 MED ORDER — METHOCARBAMOL 500 MG PO TABS
500.0000 mg | ORAL_TABLET | Freq: Three times a day (TID) | ORAL | Status: DC
  Administered 2017-12-27 – 2017-12-30 (×9): 500 mg via ORAL
  Filled 2017-12-27 (×9): qty 1

## 2017-12-27 MED ORDER — VITAMIN C 500 MG PO TABS
500.0000 mg | ORAL_TABLET | Freq: Every day | ORAL | Status: DC
  Administered 2017-12-27 – 2017-12-30 (×4): 500 mg via ORAL
  Filled 2017-12-27 (×4): qty 1

## 2017-12-27 MED ORDER — CALCIUM CITRATE 950 (200 CA) MG PO TABS
200.0000 mg | ORAL_TABLET | Freq: Two times a day (BID) | ORAL | Status: DC
  Administered 2017-12-27 – 2017-12-30 (×7): 200 mg via ORAL
  Filled 2017-12-27 (×7): qty 1

## 2017-12-27 MED ORDER — ENOXAPARIN SODIUM 40 MG/0.4ML ~~LOC~~ SOLN
40.0000 mg | SUBCUTANEOUS | Status: DC
  Administered 2017-12-27 – 2017-12-30 (×4): 40 mg via SUBCUTANEOUS
  Filled 2017-12-27 (×4): qty 0.4

## 2017-12-27 MED ORDER — SODIUM CHLORIDE 0.9 % IV BOLUS
1000.0000 mL | Freq: Once | INTRAVENOUS | Status: AC
  Administered 2017-12-27: 1000 mL via INTRAVENOUS

## 2017-12-27 NOTE — Progress Notes (Addendum)
Nutrition Follow-up  DOCUMENTATION CODES:   Non-severe (moderate) malnutrition in context of chronic illness, Underweight  INTERVENTION:   -D/c Ensure Enlive po TID, each supplement provides 350 kcal and 20 grams of protein, due to poor acceptance -Boost Breeze po TID, each supplement provides 250 kcal and 9 grams of protein -MVI with minerals daily  NUTRITION DIAGNOSIS:   Moderate Malnutrition related to chronic illness(ETOH abuse) as evidenced by energy intake < 75% for > or equal to 1 month, moderate fat depletion, mild fat depletion, mild muscle depletion, moderate muscle depletion.  Ongoing  GOAL:   Patient will meet greater than or equal to 90% of their needs  Progressing  MONITOR:   PO intake, Supplement acceptance, Labs, Weight trends, Skin, I & O's  REASON FOR ASSESSMENT:   Consult Assessment of nutrition requirement/status  ASSESSMENT:   Melanie Parsons is a 62 y.o. female with medical history significant of anxiety disorder, GERD, osteoarthritis, previous history of alcoholism who had peri-prostatic femural fracture last November with repair.  Patient has been doing fine until the last few days when she has had significant pain in the left hip.  She apparently had the nails in the area come loose.  She was seen by orthopedic surgery at [facility] where she had the original surgery done.  Patient was sent over here for repair tomorrow.  She was sent as a direct admit.  No history was sent with the patient.  No accepting physician on record.  History is therefore obtained from the patient indicating that she has been set up for possible surgery tomorrow.  She has no fever, no chills no nausea vomiting or diarrhea  9/11- remeron initiated; s/p repair of nonunion of lt femur  Spoke with pt, who was not very conversant due to pain. She reports fair appetite that increase throughout the day (dinner is her best meal). She ate very little breakfast this morning, due to  food being cold. She consumed a few sips of Ensure supplement, but reports "I can't drink it; it's nasty".   Pt consumed 3 meals per day PTA (Breakfast: honey bun and chocolate milk; Lunch: honey bun; Dinner: tenderloin, green beans, mac and cheese OR chicken alfredo OR tacos OR takeout). Pt with limited diet and of poor nutritional quality. Meals are prepared in the evening by her husband.   Pt unsure of UBW or if she lost weight. Discussed importance of good meal and supplement intake to promote healing.   Labs reviewed: CBGS: 68-87 (inpatient orders for glycemic control are 0-15 units insulin aspart TID with meals).   NUTRITION - FOCUSED PHYSICAL EXAM:    Most Recent Value  Orbital Region  No depletion  Upper Arm Region  Moderate depletion  Thoracic and Lumbar Region  Moderate depletion  Buccal Region  No depletion  Temple Region  No depletion  Clavicle Bone Region  Moderate depletion  Clavicle and Acromion Bone Region  Moderate depletion  Scapular Bone Region  Moderate depletion  Dorsal Hand  Moderate depletion  Patellar Region  Moderate depletion  Anterior Thigh Region  Moderate depletion  Posterior Calf Region  Moderate depletion  Edema (RD Assessment)  Mild  Hair  Reviewed  Eyes  Reviewed  Mouth  Reviewed  Skin  Reviewed  Nails  Reviewed       Diet Order:   Diet Order            Diet Carb Modified Fluid consistency: Thin; Room service appropriate? Yes  Diet effective  now              EDUCATION NEEDS:   Education needs have been addressed  Skin:  Skin Assessment: Skin Integrity Issues: Skin Integrity Issues:: Incisions Incisions: closed lt leg  Last BM:  12/25/17  Height:   Ht Readings from Last 1 Encounters:  12/25/17 _0  (1.778 m)    Weight:   Wt Readings from Last 1 Encounters:  12/25/17 47.1 kg    Ideal Body Weight:  72.7 kg  BMI:  Body mass index is 14.9 kg/m.  Estimated Nutritional Needs:   Kcal:  1650-1850  Protein:  85-100  grams  Fluid:  > 1.6 L    Teagon Kron A. Jimmye Norman, RD, LDN, CDE Pager: [phone] After hours Pager: [phone]

## 2017-12-27 NOTE — Progress Notes (Signed)
SCDs have been ordered  

## 2017-12-27 NOTE — Progress Notes (Signed)
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 PMH including but not limited to COPD and HTN.    Clinical Impression  Pt presented supine in bed with HOB elevated, awake and willing to participate in therapy session. Prior to admission, pt reported that she was ambulating with RW at home with husband. Pt currently requires max A x2 for bed mobility and min-mod A x2 for transfers. Pt was very limited secondary pain and anxiety. Pt would continue to benefit from skilled physical therapy services at this time while admitted and after d/c to address the below listed limitations in order to improve overall safety and independence with functional mobility.     Follow Up Recommendations SNF    Equipment Recommendations  None recommended by PT    Recommendations for Other Services       Precautions / Restrictions Precautions Precautions: Fall Restrictions Weight Bearing Restrictions: Yes LLE Weight Bearing: Touchdown weight bearing      Mobility  Bed Mobility Overal bed mobility: Needs Assistance Bed Mobility: Supine to Sit     Supine to sit: Max assist;+2 for physical assistance     General bed mobility comments: increased time and effort, cueing for sequencing, assist with R LE movement off of bed and for trunk elevation; use of bed pads to position pt's hips at EOB  Transfers Overall transfer level: Needs assistance Equipment used: Rolling walker (2 wheeled) Transfers: Sit to/from UGI CorporationStand;Stand Pivot Transfers Sit to Stand: Mod assist;+2 physical assistance Stand pivot transfers: Min assist;+2 physical assistance       General transfer comment: increased time and effort, cueing for safe hand placement and WB'ing  precautions, assist to power into standing and with pivotal movement to chair towards pt's R side  Ambulation/Gait                Stairs            Wheelchair Mobility    Modified Rankin (Stroke Patients Only)       Balance Overall balance assessment: Needs assistance Sitting-balance support: Feet supported Sitting balance-Leahy Scale: Fair     Standing balance support: Bilateral upper extremity supported Standing balance-Leahy Scale: Poor                               Pertinent Vitals/Pain Pain Assessment: Faces Faces Pain Scale: Hurts whole lot Pain Location: L hip Pain Descriptors / Indicators: Sore;Grimacing;Guarding Pain Intervention(s): Monitored during session;Repositioned    Home Living Family/patient expects to be discharged to:: Private residence Living Arrangements: Spouse/significant other Available Help at Discharge: Family Type of Home: House Home Access: Ramped entrance     Home Layout: One level Home Equipment: Environmental consultantWalker - 2 wheels;Wheelchair - manual      Prior Function Level of Independence: Independent with assistive device(s)         Comments: pt was previously ambulating with RW and working with PT     Hand Dominance        Extremity/Trunk Assessment   Upper Extremity Assessment Upper Extremity Assessment: Generalized weakness;Defer to OT evaluation    Lower Extremity Assessment Lower Extremity Assessment: Generalized weakness;LLE deficits/detail LLE Deficits / Details: pt with decreased strength and ROM limitations secondary to  post-op pain and weakness. LLE: Unable to fully assess due to pain       Communication   Communication: No difficulties  Cognition Arousal/Alertness: Awake/alert Behavior During Therapy: Anxious Overall Cognitive Status: Impaired/Different from baseline Area of Impairment: Following commands;Safety/judgement;Problem solving                       Following Commands:  Follows one step commands consistently Safety/Judgement: Decreased awareness of safety;Decreased awareness of deficits   Problem Solving: Difficulty sequencing;Requires verbal cues;Requires tactile cues        General Comments      Exercises     Assessment/Plan    PT Assessment Patient needs continued PT services  PT Problem List Decreased strength;Decreased range of motion;Decreased activity tolerance;Decreased balance;Decreased mobility;Decreased coordination;Decreased knowledge of use of DME;Decreased safety awareness;Decreased knowledge of precautions;Pain       PT Treatment Interventions DME instruction;Gait training;Stair training;Functional mobility training;Therapeutic activities;Balance training;Neuromuscular re-education;Therapeutic exercise;Patient/family education    PT Goals (Current goals can be found in the Care Plan section)  Acute Rehab PT Goals Patient Stated Goal: decrease pain PT Goal Formulation: With patient Time For Goal Achievement: 01/10/18 Potential to Achieve Goals: Good    Frequency Min 3X/week   Barriers to discharge        Co-evaluation               AM-PAC PT "6 Clicks" Daily Activity  Outcome Measure Difficulty turning over in bed (including adjusting bedclothes, sheets and blankets)?: Unable Difficulty moving from lying on back to sitting on the side of the bed? : Unable Difficulty sitting down on and standing up from a chair with arms (e.g., wheelchair, bedside commode, etc,.)?: Unable Help needed moving to and from a bed to chair (including a wheelchair)?: A Lot Help needed walking in hospital room?: Total Help needed climbing 3-5 steps with a railing? : Total 6 Click Score: 7    End of Session Equipment Utilized During Treatment: Gait belt Activity Tolerance: Patient limited by pain Patient left: in chair;with call bell/phone within reach Nurse Communication: Mobility status PT Visit Diagnosis: Other abnormalities of gait  and mobility (R26.89);Pain Pain - Right/Left: Left Pain - part of body: Hip    Time: 0912-0936 PT Time Calculation (min) (ACUTE ONLY): 24 min   Charges:   PT Evaluation $PT Eval Moderate Complexity: 1 Mod PT Treatments $Therapeutic Activity: 8-22 mins        Deborah Chalk, PT, DPT  Acute Rehabilitation Services Pager [phone] Office [phone]    Melanie Parsons 12/27/2017, 12:26 PM

## 2017-12-27 NOTE — Progress Notes (Signed)
ANTICOAGULATION CONSULT NOTE - Initial Consult  Pharmacy Consult for Lovenox Indication: VTE prophylaxis  Allergies  Allergen Reactions  . Penicillins Rash    Rash and itching    Patient Measurements: Height: 5\' 10"  (177.8 cm) Weight: 103 lb 13.4 oz (47.1 kg) IBW/kg (Calculated) : 68.5  Vital Signs: Temp: 98.8 F (37.1 C) (09/12 0519) Temp Source: Oral (09/12 0519) BP: 103/72 (09/12 0519) Pulse Rate: 83 (09/12 0519)  Labs: Recent Labs    12/25/17 2028 12/25/17 2316 12/26/17 0537 12/27/17 0424  HGB 8.8*  --  8.6* 8.2*  HCT 27.9*  --  27.5* 26.7*  PLT 418*  --  423* 414*  APTT  --   --  44*  --   LABPROT  --  15.6*  --   --   INR  --  1.25  --   --   CREATININE 0.61  --  0.50 0.48    Estimated Creatinine Clearance: 54.2 mL/min (by C-G formula based on SCr of 0.48 mg/dL).   Medical History: Past Medical History:  Diagnosis Date  . Anxiety   . Arthritis    "joints" (12/25/2017)  . GERD (gastroesophageal reflux disease)   . History of blood transfusion    "related RX I took; made my have my period q 2 wks"  . Migraine    "had one 3 days last week" (12/25/2017)  . Pneumonia    "walking pneumonia one times" (12/25/2017)  . Urinary tract infection 12/27/2017  . Wears glasses   . Wears partial dentures     Assessment: 62 y.o. F s/p removal of hardware and ORIF. Pharmacy consulted for Lovenox dosing for VTE prophylaxis. Pt with wt 47 kg and est Crcl 47 ml/min. Hgb low but stable, plt elevated. No bleeding noted.  Goal of Therapy:  Prevention of VTE   Plan:  Lovenox 40mg  SQ q24h Pharmacy will sign off -please reconsult if needed  Christoper Fabianaron Charletha Dalpe, PharmD, BCPS Clinical pharmacist  **Pharmacist phone directory can now be found on [email or web address] ([email or web address]).  Listed under [facility]. 12/27/2017,10:17 AM

## 2017-12-27 NOTE — Progress Notes (Signed)
Inpatient Diabetes Program Recommendations  AACE/ADA: New Consensus Statement on Inpatient Glycemic Control (2015)  Target Ranges:  Prepandial:   less than 140 mg/dL      Peak postprandial:   less than 180 mg/dL (1-2 hours)      Critically ill patients:  140 - 180 mg/dL   Lab Results  Component Value Date   GLUCAP 68 (L) 12/27/2017   HGBA1C 7.2 (H) 12/26/2017    Review of Glycemic Control  Consult for elevated A1c   A1c this admission 7.2%, Hgb 8.8 this admission making A1c less accurate. Patient has Hx of DM but not on medications at home. Patient will need PCP follow up.  Glucose range while here 68-111, even after Decadron 10 mg given. Will sign off.  Thanks,  Christena DeemShannon Blanka Rockholt RN, MSN, BC-ADM Inpatient Diabetes Coordinator Team Pager [phone] (8a-5p)

## 2017-12-27 NOTE — Evaluation (Signed)
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 PMH including but not limited to COPD and HTN.   Clinical Impression   Pt admitted with the above diagnoses and presents with below problem list. Pt will benefit from continued acute OT to address the below listed deficits and maximize independence with basic ADLs prior to d/c. PTA pt was mod I with ADLs. Pt is currently max +2 physical assist for bed mobility, LB ADLs and functional transfers. Current ADL assist level based on chart review, PT note, and clinical judgement. Pt recently transferred back to bed, bed level OT eval. Pt reports she is adamantly opposed to ST SNF and plans to d/c home. She reports she has multiple family members who can work together to provide +2 assistance. Leaving SNF recommendation in place in case assist level at home changes and/or pt changes mind about d/c plan.     Follow Up Recommendations  Home health OT;Supervision/Assistance - 24 hour;SNF    Equipment Recommendations  3 in 1 bedside commode    Recommendations for Other Services       Precautions / Restrictions Precautions Precautions: Fall Restrictions Weight Bearing Restrictions: Yes LLE Weight Bearing: Touchdown weight bearing      Mobility Bed Mobility Overal bed mobility: Needs Assistance Bed Mobility: Supine to Sit     Supine to sit: Max assist;+2 for physical assistance     General bed mobility comments: increased time and effort, cueing for sequencing, assist with R LE movement off of bed and for trunk elevation; use of bed pads to position pt's hips at EOB  Transfers Overall transfer level: Needs assistance Equipment used: Rolling walker (2 wheeled) Transfers: Sit to/from  [employer] Sit to Stand: Mod assist;+2 physical assistance Stand pivot transfers: Min assist;+2 physical assistance       General transfer comment: increased time and effort, cueing for safe hand placement and WB'ing precautions, assist to power into standing and with pivotal movement to chair towards pt's R side    Balance Overall balance assessment: Needs assistance Sitting-balance support: Feet supported Sitting balance-Leahy Scale: Fair     Standing balance support: Bilateral upper extremity supported Standing balance-Leahy Scale: Poor                             ADL either performed or assessed with clinical judgement   ADL Overall ADL's : Needs assistance/impaired Eating/Feeding: Set up;Sitting   Grooming: Set up;Sitting   Upper Body Bathing: Sitting;Min guard;Set up   Lower Body Bathing: Maximal assistance;+2 for physical assistance;Sit to/from stand   Upper Body Dressing : Min guard;Set up;Sitting   Lower Body Dressing: Maximal assistance;+2 for physical assistance;Sit to/from stand   Toilet Transfer: Maximal assistance;+2 for physical assistance;Stand-pivot;BSC   Toileting- Clothing Manipulation and Hygiene: Maximal assistance;+2 for physical assistance         General ADL Comments: Based on chart review, PT note, and clinical judgement. Pt recently transferred back to bed.     Vision         Perception     Praxis      Pertinent Vitals/Pain Pain Assessment: Faces Faces Pain Scale: Hurts little more Pain Location: L hip Pain Descriptors / Indicators: Sore Pain  Intervention(s): Monitored during session     Hand Dominance     Extremity/Trunk Assessment Upper Extremity Assessment Upper Extremity Assessment: Generalized weakness(arthritis)   Lower Extremity Assessment Lower Extremity Assessment: Defer to PT evaluation LLE Deficits / Details: pt with decreased strength and ROM limitations secondary to post-op pain and  weakness. LLE: Unable to fully assess due to pain       Communication Communication Communication: No difficulties   Cognition Arousal/Alertness: Awake/alert Behavior During Therapy: WFL for tasks assessed/performed;Anxious Overall Cognitive Status: Within Functional Limits for tasks assessed Area of Impairment: Following commands;Safety/judgement;Problem solving                       Following Commands: Follows one step commands consistently Safety/Judgement: Decreased awareness of safety;Decreased awareness of deficits   Problem Solving: Difficulty sequencing;Requires verbal cues;Requires tactile cues General Comments: bed level eval   General Comments       Exercises     Shoulder Instructions      Home Living Family/patient expects to be discharged to:: Private residence Living Arrangements: Spouse/significant other;Children Available Help at Discharge: Family Type of Home: House Home Access: Ramped entrance     Home Layout: One level     Bathroom Shower/Tub: Walk-in shower         Home Equipment: Environmental consultant - 2 wheels;Wheelchair - manual          Prior Functioning/Environment Level of Independence: Independent with assistive device(s)        Comments: pt was previously ambulating with RW and working with PT        OT Problem List: Impaired balance (sitting and/or standing);Decreased knowledge of use of DME or AE;Decreased knowledge of precautions;Pain      OT Treatment/Interventions: Self-care/ADL training;DME and/or AE instruction;Therapeutic activities;Patient/family education;Balance training    OT Goals(Current goals can be found in the care plan section) Acute Rehab OT Goals Patient Stated Goal: decrease pain. d/c home and not to SNF OT Goal Formulation: With patient Time For Goal Achievement: 01/10/18 Potential to Achieve Goals: Good ADL Goals Pt Will Perform Lower Body Bathing: with mod assist;sit to/from stand Pt Will Perform  Lower Body Dressing: with mod assist;sit to/from stand Pt Will Transfer to Toilet: with mod assist;stand pivot transfer;bedside commode Pt Will Perform Toileting - Clothing Manipulation and hygiene: with mod assist;sit to/from stand Additional ADL Goal #1: Pt will complete bed mobility at mod A level to prepare for OOB ADLs.  OT Frequency: Min 3X/week   Barriers to D/C:            Co-evaluation              AM-PAC PT "6 Clicks" Daily Activity     Outcome Measure Help from another person eating meals?: None Help from another person taking care of personal grooming?: A Little Help from another person toileting, which includes using toliet, bedpan, or urinal?: Total Help from another person bathing (including washing, rinsing, drying)?: Total Help from another person to put on and taking off regular upper body clothing?: A Little Help from another person to put on and taking off regular lower body clothing?: Total 6 Click Score: 13   End of Session    Activity Tolerance:   Patient left: in bed;with call bell/phone within reach  OT Visit Diagnosis: Unsteadiness on feet (R26.81);Other abnormalities of gait and mobility (R26.89);Pain Pain - Right/Left: Left Pain - part of body: Leg  Time: 1251-1300 OT Time Calculation (min): 9 min Charges:  OT General Charges $OT Visit: 1 Visit OT Evaluation $OT Eval Low Complexity: 1 Low    Raynald KempKathryn Agapito Hanway, OT Acute Rehabilitation Services Pager: [phone] Office: [phone]  12/27/2017, 1:16 PM

## 2017-12-27 NOTE — Progress Notes (Signed)
Preliminary notes--Left lower extremity venous duplex exam completed. Negative for DVT where study can approach. Limited study due to patient's condition, popliteal fossa and popliteal vein not be able to exam, cannot completely exclude possibility of thrombosis at this region. Hongying Richardson DoppCole (RDMS RVT) 12/27/17 '6:23 PM

## 2017-12-27 NOTE — Anesthesia Postprocedure Evaluation (Signed)
Anesthesia Post Note  Patient: Melanie Parsons  Procedure(s) Performed: OPEN REDUCTION INTERNAL FIXATION (ORIF) PERIPROSTHETIC FRACTURE (Left Hip) HARDWARE REMOVAL (Left Leg Upper)     Patient location during evaluation: PACU Anesthesia Type: General Level of consciousness: sedated and patient cooperative Pain management: pain level controlled Vital Signs Assessment: post-procedure vital signs reviewed and stable Respiratory status: spontaneous breathing Cardiovascular status: stable Anesthetic complications: no    Last Vitals:  Vitals:   12/27/17 1615 12/27/17 2248  BP: 104/68 95/61  Pulse: 83 86  Resp:  16  Temp: 37.5 C 37.6 C  SpO2:  92%    Last Pain:  Vitals:   12/27/17 2248  TempSrc: Oral  PainSc:    Pain Goal: Patients Stated Pain Goal: 3 (12/27/17 2218)               Lewie LoronJohn Brayson Livesey

## 2017-12-27 NOTE — Progress Notes (Signed)
PROGRESS NOTE                                                                                                                                                                                                             Patient Demographics:    Melanie Parsons, is a 62 y.o. female, DOB - 08/20/1955, ZOX:[id number]  Admit date - 12/25/2017   Admitting Physician Barnetta Chapel, MD  Outpatient Primary MD for the patient is [name], MD  LOS - 2  Outpatient Specialists:  No chief complaint on file.      Brief Narrative   62 year old female with history of anxiety, GERD, osteoarthritis, left periprosthetic femur fracture status post ORIF November 2018.  She had left hemiarthroplasty in 2016 from which she was recovering and ambulating well but fell and broke her femur distal to the prosthesis.  After fixing this he was doing touchdown weightbearing for about 3 months and started to ambulate.  However she started having pain.  For past 2 weeks she noticed significant deformity of her leg with increasing pain.  She was seen by her surgeon Dr. [name] who referred her to [facility] for repair of the malunion. Patient underwent removal of left femur hardware and repair of the nonunion.   Subjective:   Seen and examined.  Pain better controlled on current medication but still has intermittent spasms.   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  Pain control with PRN hydrocodone and low-dose Dilaudid for severe breakthrough pain.  Added Robaxin for muscle spasms.  Continue bowel regimen.  DC Foley today and up with therapy.  Active Problems:   COPD (chronic obstructive pulmonary disease) (HCC) Stable.  Resume home inhalers  Essential hypertension Stable.  Resume home medications  Generalized anxiety  disorder Continue Xanax  Chronic depression Stable.  Continue Remeron  GERD Continue PPI  Diabetes mellitus type 2 Not on meds.  Monitor on sliding scale coverage.  A1c of 7.2.  Hypothyroidism Has suppressed both TSH and free T4.  No acute symptoms.  Outpatient monitoring.  Microcytic anemia No baseline in the system.  Check iron panel.  Code Status : Full code  Family Communication  : None at bedside  Disposition Plan  : Pending postop course.  Possibly SNF  Barriers For Discharge : Postop  Consults  : Ortho ( Dr Jena Gauss)  Procedures  : ORIF left hip  DVT Prophylaxis  :  Lovenox -   Lab Results  Component Value Date   PLT 414 (H) 12/27/2017    Antibiotics  :   Anti-infectives (From admission, onward)   Start     Dose/Rate Route Frequency Ordered Stop   12/26/17 2200  ceFAZolin (ANCEF) IVPB 2g/100 mL premix     2 g 200 mL/hr over 30 Minutes Intravenous Every 8 hours 12/26/17 1804 12/27/17 2159   12/26/17 1459  tobramycin (NEBCIN) powder  Status:  Discontinued       As needed 12/26/17 1459 12/26/17 1500   12/26/17 1400  vancomycin (VANCOCIN) powder  Status:  Discontinued       As needed 12/26/17 1458 12/26/17 1500   12/26/17 1000  cefTRIAXone (ROCEPHIN) 1 g in sodium chloride 0.9 % 100 mL IVPB     1 g 200 mL/hr over 30 Minutes Intravenous Every 24 hours 12/26/17 0905     12/26/17 0800  ceFAZolin (ANCEF) powder 2 g  Status:  Discontinued    Note to Pharmacy:  Give test dose in holding area   2 g Other To Surgery 12/25/17 2227 12/25/17 2244   12/26/17 0800  ceFAZolin (ANCEF) IVPB 2g/100 mL premix    Note to Pharmacy:  Give test dose in holding area   2 g 200 mL/hr over 30 Minutes Intravenous To Surgery 12/25/17 2244 12/26/17 1125        Objective:   Vitals:   12/26/17 1800 12/26/17 2120 12/27/17 0240 12/27/17 0519  BP: 100/69 91/61 105/67 103/72  Pulse: 72 72 76 83  Resp: 18 16 16 16   Temp: 98.5 F (36.9 C) 97.9 F (36.6 C) 98.6 F (37 C) 98.8 F  (37.1 C)  TempSrc:  Oral Oral Oral  SpO2: 98% 98% 95% 93%  Weight:      Height:        Wt Readings from Last 3 Encounters:  12/25/17 47.1 kg     Intake/Output Summary (Last 24 hours) at 12/27/2017 1049 Last data filed at 12/27/2017 0230 Gross per 24 hour  Intake 1810 ml  Output 2950 ml  Net -1140 ml   Physical exam Appears fatigued, not in distress HEENT: Pallor present, moist mucosa, supple neck Chest: Clear bilaterally CVS: Normal S1 and S2, no murmurs GI: Soft, nondistended, nontender, bowel sounds present Musculoskeletal: Clean dressing over the left hip, limited mobility  Physical Exam  Gen: not in distress HEENT: moist mucosa, supple neck Chest: clear b/l, no added sounds CVS: N S1&S2, no murmurs,  GI: soft, NT, ND, BS+ Musculoskeletal: warm, no edema, dressing over left hip     Data Review:    CBC Recent Labs  Lab 12/25/17 2028 12/26/17 0537 12/27/17 0424  WBC 5.8 4.7 8.2  HGB 8.8* 8.6* 8.2*  HCT 27.9* 27.5* 26.7*  PLT 418* 423* 414*  MCV 74.2* 74.3* 75.2*  MCH 23.4* 23.2* 23.1*  MCHC 31.5 31.3 30.7  RDW 19.9* 20.1* 20.1*  LYMPHSABS 2.4  --   --   MONOABS 0.7  --   --   EOSABS 0.1  --   --   BASOSABS 0.1  --   --     Chemistries  Recent Labs  Lab 12/25/17 2028 12/26/17 0537 12/27/17 0424  NA 133* 136 132*  K 4.2 3.8 4.0  CL 102 107 102  CO2 20* 20* 23  GLUCOSE 111* 80 70  BUN 17 14 8   CREATININE 0.61 0.50 0.48  CALCIUM 8.9 8.7* 8.6*  MG  --  1.7  --   AST  --  24  --   ALT  --  18  --   ALKPHOS  --  64  --   BILITOT  --  0.3  --    ------------------------------------------------------------------------------------------------------------------ No results for input(s): CHOL, HDL, LDLCALC, TRIG, CHOLHDL, LDLDIRECT in the last 72 hours.  Lab Results  Component Value Date   HGBA1C 7.2 (H) 12/26/2017   ------------------------------------------------------------------------------------------------------------------ Recent Labs     12/26/17 0537 12/26/17 0848  TSH 0.050*  --   T3FREE  --  2.6   ------------------------------------------------------------------------------------------------------------------ No results for input(s): VITAMINB12, FOLATE, FERRITIN, TIBC, IRON, RETICCTPCT in the last 72 hours.  Coagulation profile Recent Labs  Lab 12/25/17 2316  INR 1.25    No results for input(s): DDIMER in the last 72 hours.  Cardiac Enzymes No results for input(s): CKMB, TROPONINI, MYOGLOBIN in the last 168 hours.  Invalid input(s): CK ------------------------------------------------------------------------------------------------------------------ No results found for: BNP  Inpatient Medications  Scheduled Meds: . calcium citrate  200 mg of elemental calcium Oral BID  . cholecalciferol  2,000 Units Oral BID  . enoxaparin (LOVENOX) injection  40 mg Subcutaneous Q24H  . feeding supplement (ENSURE ENLIVE)  237 mL Oral BID BM  . insulin aspart  0-15 Units Subcutaneous TID WC  . mirtazapine  30 mg Oral QHS  . multivitamin with minerals  1 tablet Oral Daily  . pantoprazole  40 mg Oral Daily  . vitamin C  500 mg Oral Daily   Continuous Infusions: .  ceFAZolin (ANCEF) IV 2 g (12/26/17 2324)  . cefTRIAXone (ROCEPHIN)  IV 1 g (12/27/17 0922)   PRN Meds:.ALPRAZolam, HYDROcodone-acetaminophen, HYDROmorphone (DILAUDID) injection, methocarbamol, ondansetron **OR** ondansetron (ZOFRAN) IV  Micro Results Recent Results (from the past 240 hour(s))  Surgical pcr screen     Status: None   Collection Time: 12/26/17 12:18 AM  Result Value Ref Range Status   MRSA, PCR NEGATIVE NEGATIVE Final   Staphylococcus aureus NEGATIVE NEGATIVE Final    Comment: (NOTE) The Xpert SA Assay (FDA approved for NASAL specimens in patients 25 years of age and older), is one component of a comprehensive surveillance program. It is not intended to diagnose infection nor to guide or monitor treatment. Performed at [facility], [address] [place], [place] [address]   Culture, Urine     Status: Abnormal (Preliminary result)   Collection Time: 12/26/17 10:11 AM  Result Value Ref Range Status   Specimen Description URINE, RANDOM  Final   Special Requests   Final    NONE Performed at [facility], [address] [place], [place] [address]    Culture >=100,000 COLONIES/mL GRAM NEGATIVE RODS (A)  Final   Report Status PENDING  Incomplete  Aerobic/Anaerobic Culture (surgical/deep wound)     Status: None (Preliminary result)   Collection Time: 12/26/17 12:21 PM  Result Value Ref Range Status   Specimen Description TISSUE LEFT LEG FEMUR NON UNION SITE  Final   Special Requests PATIENT ON FOLLOWING ANCEF  Final   Gram Stain   Final    FEW WBC PRESENT, PREDOMINANTLY PMN NO ORGANISMS SEEN Performed at [facility], [address], [place], [place] [address]    Culture PENDING  Incomplete   Report Status PENDING  Incomplete    Radiology Reports Dg C-arm 1-60 Min  Result Date: 12/26/2017 CLINICAL DATA:  Revision of left femur plate and screws with cable. EXAM: LEFT FEMUR 2 VIEWS; DG C-ARM 61-120 MIN 169 seconds under fluoroscopy. COMPARISON:  None. FINDINGS: Patient status post revision of left femoral plate and screws. At the end of the procedure, a orthopedic side plate fixated by horizontal screws is identified in the femoral shaft through fracture of the mid femoral shaft. No malalignment is identified. Left femoral hemiarthroplasty is identified. IMPRESSION: Revision of the left femur without malalignment as described. Electronically Signed   By: Sherian ReinWei-Chen  Lin M.D.   On: 12/26/2017 15:22   Dg C-arm 1-60 Min  Result Date: 12/26/2017 CLINICAL DATA:  Revision of left femur plate and screws with cable. EXAM: LEFT FEMUR 2 VIEWS; DG C-ARM 61-120 MIN 169 seconds under fluoroscopy. COMPARISON:  None. FINDINGS: Patient status post revision of left femoral plate and screws. At the end of the  procedure, a orthopedic side plate fixated by horizontal screws is identified in the femoral shaft through fracture of the mid femoral shaft. No malalignment is identified. Left femoral hemiarthroplasty is identified. IMPRESSION: Revision of the left femur without malalignment as described. Electronically Signed   By: Sherian ReinWei-Chen  Lin M.D.   On: 12/26/2017 15:22   Dg C-arm 1-60 Min  Result Date: 12/26/2017 CLINICAL DATA:  Revision of left femur plate and screws with cable. EXAM: LEFT FEMUR 2 VIEWS; DG C-ARM 61-120 MIN 169 seconds under fluoroscopy. COMPARISON:  None. FINDINGS: Patient status post revision of left femoral plate and screws. At the end of the procedure, a orthopedic side plate fixated by horizontal screws is identified in the femoral shaft through fracture of the mid femoral shaft. No malalignment is identified. Left femoral hemiarthroplasty is identified. IMPRESSION: Revision of the left femur without malalignment as described. Electronically Signed   By: Sherian ReinWei-Chen  Lin M.D.   On: 12/26/2017 15:22   Dg Femur Min 2 Views Left  Result Date: 12/26/2017 CLINICAL DATA:  Revision of left femur plate and screws with cable. EXAM: LEFT FEMUR 2 VIEWS; DG C-ARM 61-120 MIN 169 seconds under fluoroscopy. COMPARISON:  None. FINDINGS: Patient status post revision of left femoral plate and screws. At the end of the procedure, a orthopedic side plate fixated by horizontal screws is identified in the femoral shaft through fracture of the mid femoral shaft. No malalignment is identified. Left femoral hemiarthroplasty is identified. IMPRESSION: Revision of the left femur without malalignment as described. Electronically Signed   By: Sherian ReinWei-Chen  Lin M.D.   On: 12/26/2017 15:22   Dg Femur Port Min 2 Views Left  Result Date: 12/26/2017 CLINICAL DATA:  Revision of left femur fixation hardware EXAM: LEFT FEMUR PORTABLE 2 VIEWS COMPARISON:  12/25/2017 left femur radiographs FINDINGS: Near-anatomic alignment of proximal  left femoral shaft comminuted fracture status post transfixation by lateral surgical plate with multiple interlocking screws and proximal cerclage wire. Left hip hemiarthroplasty. No evidence of hardware fracture or loosening. No dislocation at the left hip or left knee. Small sclerotic lesion in the distal metaphysis of the left femur without aggressive features. Diffuse osteopenia. Expected postoperative soft tissue gas surrounding the left femur. IMPRESSION: Near-anatomic alignment of proximal left femoral shaft fracture status post ORIF. Electronically Signed   By: Delbert PhenixJason A Poff M.D.   On: 12/26/2017 17:30   Dg Femur Port Min 2 Views Left  Result Date: 12/25/2017 CLINICAL DATA:  Femur fracture EXAM: LEFT FEMUR PORTABLE 2 VIEWS COMPARISON:  02/26/2017  FINDINGS: Previous left hip replacement with normal alignment, screw, and cerclage fixation of the femoral shaft. Comminuted fracture at the proximal shaft of the femur at the site of prior fracture with new moderate varus angulation of the distal femur. Up lifting of the proximal fixating plate from the cortex of the femur with screw fragment separated from the fixating plate. Additional faint linear lucency within the distal shaft of the femur just caudal to the fixating plate. Stable sclerotic focus in the distal shaft of the femur. IMPRESSION: 1. Prior left hip replacement and surgical plate, screw and cerclage wire fixation of the shaft of the femur. Comminuted fracture at the proximal shaft of the femur in the region of the previous fracture, now with new moderate varus angulation of the distal femur 2. Interim finding of displacement of the proximal portion of the fixating plate and screws from the shaft of the femur. Electronically Signed   By: Jasmine Pang M.D.   On: 12/25/2017 23:41    Time Spent in minutes  25   Jeanita Carneiro M.D on 12/27/2017 at 10:49 AM  Between 7am to 7pm - Pager - [phone]  After 7pm go to [email or web address] - password  [facility]  [facility] -  Office  [phone]

## 2017-12-27 NOTE — Progress Notes (Signed)
Orthopedic Trauma Service Progress Note   Patient ID: Melanie Parsons MRN: [id number] DOB/AGE: 08/23/1955 62 y.o.  Subjective:  Doing ok States she sat in chair for 3 hours today   Fairly strong family hx of DM  Pt has been dealing with her injured L femur since November 2018  3 cans of regular coca-cola sitting on bedside table   Review of Systems  Constitutional: Negative for chills and fever.  Respiratory: Negative for shortness of breath and wheezing.   Cardiovascular: Negative for chest pain and palpitations. Claudication:    Gastrointestinal: Negative for abdominal pain, nausea and vomiting.    Objective:   VITALS:   Vitals:   12/27/17 0240 12/27/17 0519 12/27/17 1500 12/27/17 1503  BP: 105/67 103/72 (!) 70/50 (!) 76/Melanie  Pulse: 76 83  85  Resp: 16 16  16   Temp: 98.6 F (37 C) 98.8 F (37.1 C)  99.5 F (37.5 C)  TempSrc: Oral Oral  Oral  SpO2: 95% 93%  91%  Weight:      Height:        Estimated body mass index is 14.9 kg/m as calculated from the following:   Height as of this encounter: 5\' 10"  (1.778 m).   Weight as of this encounter: 47.1 kg.   Intake/Output      09/11 0701 - 09/12 0700 09/12 0701 - 09/13 0700   P.O.     I.V. (mL/kg) 1710 (36.3)    IV Piggyback 100 446.6   Total Intake(mL/kg) 1810 (38.4) 446.6 (9.5)   Urine (mL/kg/hr) 2800 (2.5)    Blood 150    Total Output 2950    Net -1140 +446.6          LABS  Results for orders placed or performed during the hospital encounter of 12/25/17 (from the past 24 hour(s))  Glucose, capillary     Status: None   Collection Time: 12/26/17  6:14 PM  Result Value Ref Range   Glucose-Capillary 98 70 - 99 mg/dL  Glucose, capillary     Status: None   Collection Time: 12/27/17  2:38 AM  Result Value Ref Range   Glucose-Capillary 81 70 - 99 mg/dL  CBC     Status: Abnormal   Collection Time: 12/27/17  4:24 AM  Result Value Ref Range   WBC 8.2 4.0 - 10.5 K/uL   RBC  3.Melanie (L) 3.87 - 5.11 MIL/uL   Hemoglobin 8.2 (L) 12.0 - 15.0 g/dL   HCT 40.9 (L) 81.1 - 91.4 %   MCV 75.2 (L) 78.0 - 100.0 fL   MCH 23.1 (L) 26.0 - 34.0 pg   MCHC 30.7 30.0 - 36.0 g/dL   RDW 78.2 (H) 95.6 - 21.3 %   Platelets 414 (H) 150 - 400 K/uL  Basic metabolic panel     Status: Abnormal   Collection Time: 12/27/17  4:24 AM  Result Value Ref Range   Sodium 132 (L) 135 - 145 mmol/L   Potassium 4.0 3.5 - 5.1 mmol/L   Chloride 102 98 - 111 mmol/L   CO2 23 22 - 32 mmol/L   Glucose, Bld 70 70 - 99 mg/dL   BUN 8 8 - 23 mg/dL   Creatinine, Ser 0.86 0.44 - 1.00 mg/dL   Calcium 8.6 (L) 8.9 - 10.3 mg/dL   GFR calc non Af Amer >60 >60 mL/min   GFR calc Af Amer >60 >60 mL/min   Anion gap 7 5 - 15  Glucose, capillary  Status: Abnormal   Collection Time: 12/27/17  7:59 AM  Result Value Ref Range   Glucose-Capillary 68 (L) 70 - 99 mg/dL  Iron and TIBC     Status: Abnormal   Collection Time: 12/27/17 11:16 AM  Result Value Ref Range   Iron 18 (L) 28 - 170 ug/dL   TIBC 409 811 - 914 ug/dL   Saturation Ratios 5 (L) 10.4 - 31.8 %   UIBC 322 ug/dL  Ferritin     Status: None   Collection Time: 12/27/17 11:16 AM  Result Value Ref Range   Ferritin 11 11 - 307 ng/mL  Glucose, capillary     Status: None   Collection Time: 12/27/17 11:49 AM  Result Value Ref Range   Glucose-Capillary 87 70 - 99 mg/dL     PHYSICAL EXAM:   Gen: resting comfortably in bed, a little histrionic with exam this am  Lungs: breathing unlabored Cardiac: regular  Ext:       Left Lower Extremity   Dressing c/d/i  Very sensitive with light touch to thigh   Very comfortable w/o anyone examining her   Distal motor and sensory functions intact  + calf tenderness  Minimal swelling to left leg  + DP pulse     Assessment/Plan: 1 Day Post-Op   Principal Problem:   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 Active Problems:   COPD (chronic obstructive pulmonary disease) (HCC)    HTN (hypertension)   Generalized anxiety disorder   Depression   Left displaced femoral neck fracture (HCC)   Type 2 diabetes mellitus (HCC)   Urinary tract infection   Anti-infectives (From admission, onward)   Start     Dose/Rate Route Frequency Ordered Stop   12/26/17 2200  ceFAZolin (ANCEF) IVPB 2g/100 mL premix  Status:  Discontinued     2 g 200 mL/hr over 30 Minutes Intravenous Every 8 hours 12/26/17 1804 12/27/17 1507   12/26/17 1459  tobramycin (NEBCIN) powder  Status:  Discontinued       As needed 12/26/17 1459 12/26/17 1500   12/26/17 1400  vancomycin (VANCOCIN) powder  Status:  Discontinued       As needed 12/26/17 1458 12/26/17 1500   12/26/17 1000  cefTRIAXone (ROCEPHIN) 1 g in sodium chloride 0.9 % 100 mL IVPB  Status:  Discontinued     1 g 200 mL/hr over 30 Minutes Intravenous Every 24 hours 12/26/17 0905 12/27/17 1507   12/26/17 0800  ceFAZolin (ANCEF) powder 2 g  Status:  Discontinued    Note to Pharmacy:  Give test dose in holding area   2 g Other To Surgery 12/25/17 2227 12/25/17 2244   12/26/17 0800  ceFAZolin (ANCEF) IVPB 2g/100 mL premix    Note to Pharmacy:  Give test dose in holding area   2 g 200 mL/hr over 30 Minutes Intravenous To Surgery 12/25/17 2244 12/26/17 1125    .  POD/HD#: 1  62 y/o female with L periprosthetic femur fracture nonunion, new dx of DM 2   -L periprosthetic femur nonunion   TDWB L leg  ROM as tolerated L knee  Posterior THA precautions  PT/OT  Dressing change tomorrow    Ice PRN   Encourage pt to be active as possible- it think pt will need a bit of motivation   - Pain management:  Adjusted medications   Tylenol 500 mg po q8h scheduled   norco 7.5/325 1-2 po q8h prn    Robaxin  500 mg po q8h scheduled    - ABL anemia/Hemodynamics  H/H trending down  Pt asymptomatic  Cbc in am    - Medical issues   New dx of DM   Per medicine   Follow up with PCP  - DVT/PE prophylaxis:  Lovenox   Check doppler of L leg   -  ID:   periop abx   - Metabolic Bone Disease:  + vitamin d deficiency    Supplementation    Additional bone health labs notable for depressed TSH and free t4, recheck in 12 weeks or so    PTH reasonable     Will attempt to get pt bone growth stimulator for her chronic nonunion   - Activity:  TDWB L leg   Therapies   - FEN/GI prophylaxis/Foley/Lines:  CHO mod diet   DM education   - Impediments to fracture healing:  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 Dhwani Venkatesh, PA-C [facility] [phone](P) [phone] Traci Sermon(O) [phone] (C) 12/27/2017, 3:19 PM

## 2017-12-28 DIAGNOSIS — D62 Acute posthemorrhagic anemia: Secondary | ICD-10-CM

## 2017-12-28 DIAGNOSIS — D508 Other iron deficiency anemias: Secondary | ICD-10-CM

## 2017-12-28 DIAGNOSIS — E440 Moderate protein-calorie malnutrition: Secondary | ICD-10-CM | POA: Diagnosis present

## 2017-12-28 DIAGNOSIS — I959 Hypotension, unspecified: Secondary | ICD-10-CM

## 2017-12-28 LAB — URINE CULTURE: Culture: >=100000 — AB

## 2017-12-28 LAB — HEMOGLOBIN AND HEMATOCRIT, BLOOD
HCT: 26.8 % — ABNORMAL LOW (ref 36.0–46.0)
Hemoglobin: 8.7 g/dL — ABNORMAL LOW (ref 12.0–15.0)

## 2017-12-28 LAB — CBC
HEMATOCRIT: 24.9 % — AB (ref 36.0–46.0)
HEMOGLOBIN: 7.7 g/dL — AB (ref 12.0–15.0)
MCH: 23 pg — ABNORMAL LOW (ref 26.0–34.0)
MCHC: 30.9 g/dL (ref 30.0–36.0)
MCV: 74.3 fL — ABNORMAL LOW (ref 78.0–100.0)
Platelets: 361 10*3/uL (ref 150–400)
RBC: 3.35 MIL/uL — AB (ref 3.87–5.11)
RDW: 19.6 % — ABNORMAL HIGH (ref 11.5–15.5)
WBC: 8.5 10*3/uL (ref 4.0–10.5)

## 2017-12-28 LAB — GLUCOSE, CAPILLARY
Glucose-Capillary: 90 mg/dL (ref 70–99)
Glucose-Capillary: 84 mg/dL (ref 70–99)
Glucose-Capillary: 100 mg/dL — ABNORMAL HIGH (ref 70–99)
Glucose-Capillary: 110 mg/dL — ABNORMAL HIGH (ref 70–99)

## 2017-12-28 LAB — PREPARE RBC (CROSSMATCH)

## 2017-12-28 MED ORDER — SODIUM CHLORIDE 0.9% IV SOLUTION
Freq: Once | INTRAVENOUS | Status: AC
  Administered 2017-12-28: 16:00:00 via INTRAVENOUS

## 2017-12-28 MED ORDER — SODIUM CHLORIDE 0.9 % IV BOLUS: 1000.0000 mL | Freq: Once | INTRAVENOUS | Status: DC

## 2017-12-28 MED ORDER — SODIUM CHLORIDE 0.9 % IV SOLN
INTRAVENOUS | Status: AC
  Administered 2017-12-28 – 2017-12-29 (×2): via INTRAVENOUS

## 2017-12-28 NOTE — Care Management Note (Addendum)
Case Management Note  Patient Details  Name: Melanie Parsons MRN: [id number] Date of Birth: 16-Feb-1956  Subjective/Objective:                    Action/Plan: Ref[facility]   :[facility] called back due to staffing they are unable to accept referral. [facility] aware and [facility]. [facility] awaiting [facility] back.  Discussed discharge planning with patient at bedside. Patient voiced understanding of OT/PT recommendations of SNF. Patient feels she does not need SNF at this time. She lives with her husband Melanie CavesJessie [phone]. She has multiple family members who are available to help her also ( states they did after her last surgery too).    Patient has 3 in 1 and walker and lift chair at home. After her last surgery she had HHPT [facility] and would like them again.    Confirmed face sheet information. Home phone number is correct ( does not work in her bedroom) , best numbers to reach are her cell [phone] or her husband Melanie CavesJessie [phone].   Called DR Haddix for home health orders and face to face. He is currently in surgery ,message left with operating nurse.  Received orders for HHPT/OT, discharge depends on patient mobility and pain control . Patient aware. [facility]   1430 [place] [facility] [place] [facility] and faxed , referral accepted.Will need to fax dc summary on day of discharge. Expected Discharge Date:                  Expected Discharge Plan:  Home w Home Health Services  In-House Referral:     Discharge planning Services  CM Consult  Post Acute Care Choice:  Durable Medical Equipment, Home Health Choice offered to:  Patient  DME Arranged:  N/A DME Agency:  NA  HH Arranged:    HH Agency:  [facility]  Status of Service:  In process, will continue to follow  If  discussed at Long Length of Stay Meetings, dates discussed:    Additional Comments:  Melanie Parsons, Melanie Coffelt Marie, RN 12/28/2017, 11:03 AM

## 2017-12-28 NOTE — Progress Notes (Addendum)
Patient has not had urine output since removal of foley at 9am. Bladder scan shows >47250ml. MD paged.   1803 In and out patient- obtained 500ml. MD stated if patient has not produced urine by 2000 or 2100 to place foley cath.

## 2017-12-28 NOTE — Progress Notes (Signed)
Orthopaedic Trauma Progress Note  S: Patient having significant soreness and pain in her thigh.  No major issues overnight.  Denies any chest pain or shortness of breath.  Was up with therapy yesterday.  O:  Vitals:   12/27/17 2248 12/28/17 0532  BP: 95/61 102/65  Pulse: 86 92  Resp: 16 20  Temp: 99.7 F (37.6 C) 99.8 F (37.7 C)  SpO2: 92% 92%   General: No acute distress, awake alert and oriented Left lower extremity: Thigh sore but swelling appropriate.  Incision is clean dry and intact no signs of drainage.  Compartments are soft compressible.  Active dorsiflexion plantarflexion of her toe and foot.  Positive DP pulse.  Imaging: Stable postop imaging  Labs:  Results for orders placed or performed during the hospital encounter of 12/25/17 (from the past 24 hour(s))  Glucose, capillary     Status: Abnormal   Collection Time: 12/27/17  7:59 AM  Result Value Ref Range   Glucose-Capillary 68 (L) 70 - 99 mg/dL  Iron and TIBC     Status: Abnormal   Collection Time: 12/27/17 11:16 AM  Result Value Ref Range   Iron 18 (L) 28 - 170 ug/dL   TIBC 161340 096250 - 045450 ug/dL   Saturation Ratios 5 (L) 10.4 - 31.8 %   UIBC 322 ug/dL  Ferritin     Status: None   Collection Time: 12/27/17 11:16 AM  Result Value Ref Range   Ferritin 11 11 - 307 ng/mL  Glucose, capillary     Status: None   Collection Time: 12/27/17 11:49 AM  Result Value Ref Range   Glucose-Capillary 87 70 - 99 mg/dL  Glucose, capillary     Status: Abnormal   Collection Time: 12/27/17  5:14 PM  Result Value Ref Range   Glucose-Capillary 136 (H) 70 - 99 mg/dL  Glucose, capillary     Status: None   Collection Time: 12/27/17 10:18 PM  Result Value Ref Range   Glucose-Capillary 97 70 - 99 mg/dL  CBC     Status: Abnormal   Collection Time: 12/28/17  4:38 AM  Result Value Ref Range   WBC 8.5 4.0 - 10.5 K/uL   RBC 3.35 (L) 3.87 - 5.11 MIL/uL   Hemoglobin 7.7 (L) 12.0 - 15.0 g/dL   HCT 40.924.9 (L) 81.136.0 - 91.446.0 %   MCV 74.3 (L)  78.0 - 100.0 fL   MCH 23.0 (L) 26.0 - 34.0 pg   MCHC 30.9 30.0 - 36.0 g/dL   RDW 78.219.6 (H) 95.611.5 - 21.315.5 %   Platelets 361 150 - 400 K/uL    62 y/o female with L periprosthetic femur fracture nonunion, new dx of DM 2   -L periprosthetic femur nonunion              TDWB L leg             ROM as tolerated L knee             PT/OT             Dressing change today, PRN dressings                 Ice PRN                        Encourage pt to be active as possible- it think pt will need a bit of motivation   - Pain management:  Tylenol 500 mg po q8h scheduled                         norco 7.5/325 1-2 po q8h prn                          Robaxin 500 mg po q8h scheduled                          - ABL anemia/Hemodynamics             Hgb 8.8-->7.7             Pt asymptomatic             No role for transfusion               - Medical issues              New dx of DM                         Per medicine                         Follow up with PCP   Sliding scale insulin  - DVT/PE prophylaxis:             Lovenox              Check doppler of L leg   - ID:              periop abx -completed  - Metabolic Bone Disease:             + vitamin d deficiency                          Supplementation                          Additional bone health labs notable for depressed TSH and free t4, recheck in 12 weeks or so                          PTH reasonable                                      Will attempt to get pt bone growth stimulator for her chronic nonunion   - Activity:             TDWB L leg              Therapies   - FEN/GI prophylaxis/Foley/Lines:             CHO mod diet              DM education   - Impediments to fracture healing:             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

## 2017-12-28 NOTE — Progress Notes (Signed)
PROGRESS NOTE                                                                                                                                                                                                             Patient Demographics:    Melanie Parsons, is a 62 y.o. female, DOB - 11-24-1955, ZOX:[id number]  Admit date - 12/25/2017   Admitting Physician Barnetta Chapel, MD  Outpatient Primary MD for the patient is [name], MD  LOS - 3  Outpatient Specialists:  No chief complaint on file.      Brief Narrative   62 year old female with history of anxiety, GERD, osteoarthritis, left periprosthetic femur fracture status post ORIF November 2018.  She had left hemiarthroplasty in 2016 from which she was recovering and ambulating well but fell and broke her femur distal to the prosthesis.  After fixing this he was doing touchdown weightbearing for about 3 months and started to ambulate.  However she started having pain.  For past 2 weeks she noticed significant deformity of her leg with increasing pain.  She was seen by her surgeon Dr. [name] who referred her to [facility] for repair of the malunion. Patient underwent removal of left femur hardware and repair of the nonunion.   Subjective:   Although pain in the left hip is slowly getting better she still complains of a lot of soreness and spasms.   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  Still reports significant pain.  Pain control with PRN hydrocodone and low-dose Dilaudid for severe breakthrough pain.  Added Robaxin for muscle spasms.  Continue bowel regimen.  Foley discontinued.  Doppler of the left leg was done by orthopedics, negative for DVT (limited study due to pain in her thighs) PT recommends SNF but patient wants to go home with home  health stating she has enough help from lots of family members and has done well with home health PT following her last hip surgery.  Active Problems:   COPD (chronic obstructive pulmonary disease) (HCC) Stable.  Resume home inhalers  Hypotension Systolic blood pressure in the 80s.  Asymptomatic.  Patient had low blood pressure yesterday as well and received normal saline bolus.  Will order another normal saline bolus and placed on maintenance fluids.  Also noted for drop in H&H.   transfuse with 1 unit PRBC.  Check a.m. cortisol.  Generalized anxiety disorder Continue Xanax  Chronic depression Stable.  Continue Remeron  GERD Continue PPI  Diabetes mellitus type 2 Not on meds.  Monitor on sliding scale coverage.  A1c of 7.2.  Hypothyroidism Has suppressed both TSH and free T4.  No acute symptoms.  Needs outpatient monitoring.  Iron deficiency anemia.   Also has worsened H&H from acute blood loss.  Hemoglobin 7.7 today.  Check Hemoccult.  Will transfuse 1 unit PRBC.  Add iron supplement in a.m.  E. coli UTI Asymptomatic.  Already received 3 days of antibiotics.  Code Status : Full code  Family Communication  : None at bedside  Disposition Plan  : Home possibly tomorrow if blood pressure stable and hemoglobin improved.  Barriers For Discharge : Postop  Consults  : Ortho ( Dr Jena GaussHaddix)  Procedures  : ORIF left hip  DVT Prophylaxis  :  Lovenox -   Lab Results  Component Value Date   PLT 361 12/28/2017    Antibiotics  :   Anti-infectives (From admission, onward)   Start     Dose/Rate Route Frequency Ordered Stop   12/26/17 2200  ceFAZolin (ANCEF) IVPB 2g/100 mL premix  Status:  Discontinued     2 g 200 mL/hr over 30 Minutes Intravenous Every 8 hours 12/26/17 1804 12/27/17 1507   12/26/17 1459  tobramycin (NEBCIN) powder  Status:  Discontinued       As needed 12/26/17 1459 12/26/17 1500   12/26/17 1400  vancomycin (VANCOCIN) powder  Status:  Discontinued       As  needed 12/26/17 1458 12/26/17 1500   12/26/17 1000  cefTRIAXone (ROCEPHIN) 1 g in sodium chloride 0.9 % 100 mL IVPB  Status:  Discontinued     1 g 200 mL/hr over 30 Minutes Intravenous Every 24 hours 12/26/17 0905 12/27/17 1507   12/26/17 0800  ceFAZolin (ANCEF) powder 2 g  Status:  Discontinued    Note to Pharmacy:  Give test dose in holding area   2 g Other To Surgery 12/25/17 2227 12/25/17 2244   12/26/17 0800  ceFAZolin (ANCEF) IVPB 2g/100 mL premix    Note to Pharmacy:  Give test dose in holding area   2 g 200 mL/hr over 30 Minutes Intravenous To Surgery 12/25/17 2244 12/26/17 1125        Objective:   Vitals:   12/27/17 1503 12/27/17 1615 12/27/17 2248 12/28/17 0532  BP: (!) 76/55 104/68 95/61 102/65  Pulse: 85 83 86 92  Resp: 16  16 20   Temp: 99.5 F (37.5 C) 99.5 F (37.5 C) 99.7 F (37.6 C) 99.8 F (37.7 C)  TempSrc: Oral Oral Oral Oral  SpO2: 91%  92% 92%  Weight:      Height:        Wt Readings from Last 3 Encounters:  12/25/17 47.1 kg     Intake/Output Summary (Last 24 hours) at 12/28/2017 1448 Last data filed at 12/28/2017 1439 Gross per 24 hour  Intake 1686.63 ml  Output 1525 ml  Net 161.63 ml   Physical exam Appears fatigued, not in distress HEENT: Pallor present, moist mucosa, supple neck Chest: Clear bilaterally CVS: Normal S1 and S2, no murmurs GI: Soft, nondistended, nontender, bowel sounds present Musculoskeletal: Clean dressing over the left hip, limited mobility      Data Review:  CBC Recent Labs  Lab 12/25/17 2028 12/26/17 0537 12/27/17 0424 12/28/17 0438  WBC 5.8 4.7 8.2 8.5  HGB 8.8* 8.6* 8.2* 7.7*  HCT 27.9* 27.5* 26.7* 24.9*  PLT 418* 423* 414* 361  MCV 74.2* 74.3* 75.2* 74.3*  MCH 23.4* 23.2* 23.1* 23.0*  MCHC 31.5 31.3 30.7 30.9  RDW 19.9* 20.1* 20.1* 19.6*  LYMPHSABS 2.4  --   --   --   MONOABS 0.7  --   --   --   EOSABS 0.1  --   --   --   BASOSABS 0.1  --   --   --     Chemistries  Recent Labs  Lab  12/25/17 2028 12/26/17 0537 12/27/17 0424  NA 133* 136 132*  K 4.2 3.8 4.0  CL 102 107 102  CO2 20* 20* 23  GLUCOSE 111* 80 70  BUN 17 14 8   CREATININE 0.61 0.50 0.48  CALCIUM 8.9 8.7*  8.6* 8.6*  MG  --  1.7  --   AST  --  24  --   ALT  --  18  --   ALKPHOS  --  64  --   BILITOT  --  0.3  --    ------------------------------------------------------------------------------------------------------------------ No results for input(s): CHOL, HDL, LDLCALC, TRIG, CHOLHDL, LDLDIRECT in the last 72 hours.  Lab Results  Component Value Date   HGBA1C 7.2 (H) 12/26/2017   ------------------------------------------------------------------------------------------------------------------ Recent Labs    12/26/17 0537 12/26/17 0848  TSH 0.050*  --   T3FREE  --  2.6   ------------------------------------------------------------------------------------------------------------------ Recent Labs    12/27/17 1116  FERRITIN 11  TIBC 340  IRON 18*    Coagulation profile Recent Labs  Lab 12/25/17 2316  INR 1.25    No results for input(s): DDIMER in the last 72 hours.  Cardiac Enzymes No results for input(s): CKMB, TROPONINI, MYOGLOBIN in the last 168 hours.  Invalid input(s): CK ------------------------------------------------------------------------------------------------------------------ No results found for: BNP  Inpatient Medications  Scheduled Meds: . acetaminophen  500 mg Oral Q8H  . calcium citrate  200 mg of elemental calcium Oral BID  . cholecalciferol  2,000 Units Oral BID  . enoxaparin (LOVENOX) injection  40 mg Subcutaneous Q24H  . feeding supplement  1 Container Oral TID BM  . insulin aspart  0-15 Units Subcutaneous TID WC  . methocarbamol  500 mg Oral Q8H  . mirtazapine  30 mg Oral QHS  . multivitamin with minerals  1 tablet Oral Daily  . pantoprazole  40 mg Oral Daily  . vitamin C  500 mg Oral Daily   Continuous Infusions:  PRN Meds:.ALPRAZolam,  HYDROcodone-acetaminophen, HYDROmorphone (DILAUDID) injection, ondansetron **OR** ondansetron (ZOFRAN) IV  Micro Results Recent Results (from the past 240 hour(s))  Surgical pcr screen     Status: None   Collection Time: 12/26/17 12:18 AM  Result Value Ref Range Status   MRSA, PCR NEGATIVE NEGATIVE Final   Staphylococcus aureus NEGATIVE NEGATIVE Final    Comment: (NOTE) The Xpert SA Assay (FDA approved for NASAL specimens in patients 30 years of age and older), is one component of a comprehensive surveillance program. It is not intended to diagnose infection nor to guide or monitor treatment. Performed at [facility], [address] [place], [place] [address]   Culture, Urine     Status: Abnormal   Collection Time: 12/26/17 10:11 AM  Result Value Ref Range Status   Specimen Description URINE, RANDOM  Final   Special Requests  Final    NONE Performed at [facility], [address], [place], [place] [address]    Culture >=100,000 COLONIES/mL ESCHERICHIA COLI (A)  Final   Report Status 12/28/2017 FINAL  Final   Organism ID, Bacteria ESCHERICHIA COLI (A)  Final      Susceptibility   Escherichia coli - MIC*    AMPICILLIN >=32 RESISTANT Resistant     CEFAZOLIN <=4 SENSITIVE Sensitive     CEFTRIAXONE <=1 SENSITIVE Sensitive     CIPROFLOXACIN <=0.25 SENSITIVE Sensitive     GENTAMICIN <=1 SENSITIVE Sensitive     IMIPENEM <=0.25 SENSITIVE Sensitive     NITROFURANTOIN <=16 SENSITIVE Sensitive     TRIMETH/SULFA <=20 SENSITIVE Sensitive     AMPICILLIN/SULBACTAM 16 INTERMEDIATE Intermediate     PIP/TAZO <=4 SENSITIVE Sensitive     Extended ESBL NEGATIVE Sensitive     * >=100,000 COLONIES/mL ESCHERICHIA COLI  Aerobic/Anaerobic Culture (surgical/deep wound)     Status: None (Preliminary result)   Collection Time: 12/26/17 12:21 PM  Result Value Ref Range Status   Specimen Description TISSUE LEFT LEG FEMUR NON UNION SITE  Final   Special Requests PATIENT ON FOLLOWING  ANCEF  Final   Gram Stain   Final    FEW WBC PRESENT, PREDOMINANTLY PMN NO ORGANISMS SEEN    Culture   Final    NO GROWTH 2 DAYS NO ANAEROBES ISOLATED; CULTURE IN PROGRESS FOR 5 DAYS Performed at [facility], [address] [place], [place] [address]    Report Status PENDING  Incomplete    Radiology Reports Dg C-arm 1-60 Min  Result Date: 12/26/2017 CLINICAL DATA:  Revision of left femur plate and screws with cable. EXAM: LEFT FEMUR 2 VIEWS; DG C-ARM 61-120 MIN 169 seconds under fluoroscopy. COMPARISON:  None. FINDINGS: Patient status post revision of left femoral plate and screws. At the end of the procedure, a orthopedic side plate fixated by horizontal screws is identified in the femoral shaft through fracture of the mid femoral shaft. No malalignment is identified. Left femoral hemiarthroplasty is identified. IMPRESSION: Revision of the left femur without malalignment as described. Electronically Signed   By: Sherian Rein M.D.   On: 12/26/2017 15:22   Dg C-arm 1-60 Min  Result Date: 12/26/2017 CLINICAL DATA:  Revision of left femur plate and screws with cable. EXAM: LEFT FEMUR 2 VIEWS; DG C-ARM 61-120 MIN 169 seconds under fluoroscopy. COMPARISON:  None. FINDINGS: Patient status post revision of left femoral plate and screws. At the end of the procedure, a orthopedic side plate fixated by horizontal screws is identified in the femoral shaft through fracture of the mid femoral shaft. No malalignment is identified. Left femoral hemiarthroplasty is identified. IMPRESSION: Revision of the left femur without malalignment as described. Electronically Signed   By: Sherian Rein M.D.   On: 12/26/2017 15:22   Dg C-arm 1-60 Min  Result Date: 12/26/2017 CLINICAL DATA:  Revision of left femur plate and screws with cable. EXAM: LEFT FEMUR 2 VIEWS; DG C-ARM 61-120 MIN 169 seconds under fluoroscopy. COMPARISON:  None. FINDINGS: Patient status post revision of left femoral plate and screws. At  the end of the procedure, a orthopedic side plate fixated by horizontal screws is identified in the femoral shaft through fracture of the mid femoral shaft. No malalignment is identified. Left femoral hemiarthroplasty is identified. IMPRESSION: Revision of the left femur without malalignment as described. Electronically Signed   By: Sherian Rein M.D.   On: 12/26/2017 15:22  Dg Femur Min 2 Views Left  Result Date: 12/26/2017 CLINICAL DATA:  Revision of left femur plate and screws with cable. EXAM: LEFT FEMUR 2 VIEWS; DG C-ARM 61-120 MIN 169 seconds under fluoroscopy. COMPARISON:  None. FINDINGS: Patient status post revision of left femoral plate and screws. At the end of the procedure, a orthopedic side plate fixated by horizontal screws is identified in the femoral shaft through fracture of the mid femoral shaft. No malalignment is identified. Left femoral hemiarthroplasty is identified. IMPRESSION: Revision of the left femur without malalignment as described. Electronically Signed   By: Sherian Rein M.D.   On: 12/26/2017 15:22   Dg Femur Port Min 2 Views Left  Result Date: 12/26/2017 CLINICAL DATA:  Revision of left femur fixation hardware EXAM: LEFT FEMUR PORTABLE 2 VIEWS COMPARISON:  12/25/2017 left femur radiographs FINDINGS: Near-anatomic alignment of proximal left femoral shaft comminuted fracture status post transfixation by lateral surgical plate with multiple interlocking screws and proximal cerclage wire. Left hip hemiarthroplasty. No evidence of hardware fracture or loosening. No dislocation at the left hip or left knee. Small sclerotic lesion in the distal metaphysis of the left femur without aggressive features. Diffuse osteopenia. Expected postoperative soft tissue gas surrounding the left femur. IMPRESSION: Near-anatomic alignment of proximal left femoral shaft fracture status post ORIF. Electronically Signed   By: Delbert Phenix M.D.   On: 12/26/2017 17:30   Dg Femur Port Min 2 Views  Left  Result Date: 12/25/2017 CLINICAL DATA:  Femur fracture EXAM: LEFT FEMUR PORTABLE 2 VIEWS COMPARISON:  02/26/2017 FINDINGS: Previous left hip replacement with normal alignment, screw, and cerclage fixation of the femoral shaft. Comminuted fracture at the proximal shaft of the femur at the site of prior fracture with new moderate varus angulation of the distal femur. Up lifting of the proximal fixating plate from the cortex of the femur with screw fragment separated from the fixating plate. Additional faint linear lucency within the distal shaft of the femur just caudal to the fixating plate. Stable sclerotic focus in the distal shaft of the femur. IMPRESSION: 1. Prior left hip replacement and surgical plate, screw and cerclage wire fixation of the shaft of the femur. Comminuted fracture at the proximal shaft of the femur in the region of the previous fracture, now with new moderate varus angulation of the distal femur 2. Interim finding of displacement of the proximal portion of the fixating plate and screws from the shaft of the femur. Electronically Signed   By: Jasmine Pang M.D.   On: 12/25/2017 23:41    Time Spent in minutes  35    Melanie Parsons M.D on 12/28/2017 at 2:48 PM  Between 7am to 7pm - Pager - [phone]  After 7pm go to [email or web address] - password [facility]  [facility] -  Office  [phone]

## 2017-12-28 NOTE — Social Work (Signed)
CSW acknowledging consult for SNF, also aware pt verbally declined SNF placement stating that she has assistance at home to therapy staff. Have spoken with RN Case Manager and if pt does indeed agree to SNF then CSW will support disposition.   CSW signing off. Please consult if any additional needs arise.  Doy HutchingIsabel H Carrissa Taitano, LCSWA [facility] Clinical Social Work [phone]

## 2017-12-28 NOTE — Progress Notes (Signed)
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 PMH including but not limited to COPD and HTN.    PT Comments    Pt supine in bed awaiting blood transfusion.  Tx limited due to need for transfusion to start shortly but she refused OOB to stand.  Pt limited to therapeutic exercises with increased movement on R side as she reports L side is too painful to move.  PTA offered ice pack and patient declined.  Continue to recommend SNF placement as patient is limited and presents with self limiting behavior.      Follow Up Recommendations  SNF     Equipment Recommendations  None recommended by PT    Recommendations for Other Services       Precautions / Restrictions Precautions Precautions: Fall Restrictions Weight Bearing Restrictions: Yes LLE Weight Bearing: Touchdown weight bearing    Mobility  Bed Mobility               General bed mobility comments: Refused.    Transfers                    Ambulation/Gait                 Stairs             Wheelchair Mobility    Modified Rankin (Stroke Patients Only)       Balance                                            Cognition Arousal/Alertness: Awake/alert Behavior During Therapy: WFL for tasks assessed/performed;Anxious Overall Cognitive Status: Impaired/Different from baseline Area of Impairment: Safety/judgement                         Safety/Judgement: Decreased awareness of deficits     General Comments: Pt hesistant to move LLE more due to pain despite education on the benefits of mobility.        Exercises General Exercises - Lower Extremity Ankle Circles/Pumps: AROM;Both;10 reps;Supine(Decreased  ROM in L ankle) Quad Sets: AROM;Both;10 reps;Supine(decreased contraction in L quad.  ) Heel Slides: AROM;Right;10 reps;Supine(attempted x1 on L side around 15 degrees knee flexion and patient refused to attempt more.  She did perform 10 reps on R side.  ) Hip ABduction/ADduction: Right;10 reps;Supine;AAROM Straight Leg Raises: AAROM;Right;10 reps;Supine    General Comments        Pertinent Vitals/Pain Pain Assessment: Faces Faces Pain Scale: Hurts little more Pain Location: L hip Pain Descriptors / Indicators: Sore Pain Intervention(s): Monitored during session;Repositioned    Home Living                      Prior Function            PT Goals (current goals can now be found in the care plan section) Acute Rehab PT Goals Patient Stated Goal: decrease pain. d/c home and not to SNF Potential to Achieve Goals: Fair Progress towards PT goals: Not progressing toward goals - comment(slef limiting behavior)    Frequency    Min 3X/week  PT Plan Current plan remains appropriate    Co-evaluation              AM-PAC PT "6 Clicks" Daily Activity  Outcome Measure  Difficulty turning over in bed (including adjusting bedclothes, sheets and blankets)?: Unable Difficulty moving from lying on back to sitting on the side of the bed? : Unable Difficulty sitting down on and standing up from a chair with arms (e.g., wheelchair, bedside commode, etc,.)?: Unable Help needed moving to and from a bed to chair (including a wheelchair)?: A Lot Help needed walking in hospital room?: Total Help needed climbing 3-5 steps with a railing? : Total 6 Click Score: 7    End of Session Equipment Utilized During Treatment: Gait belt Activity Tolerance: Patient limited by pain Patient left: in chair;with call bell/phone within reach Nurse Communication: Mobility status PT Visit Diagnosis: Other abnormalities of gait and mobility (R26.89);Pain Pain - Right/Left: Left Pain - part  of body: Hip     Time: [id number] PT Time Calculation (min) (ACUTE ONLY): 18 min  Charges:  $Therapeutic Exercise: 8-22 mins                     Joycelyn RuaAimee Mitcheal Sweetin, PTA Acute Rehabilitation Services Pager [phone] Office [phone]     Gisella Alwine Artis DelayJ Josiephine Simao 12/28/2017, 4:15 PM

## 2017-12-28 NOTE — Progress Notes (Signed)
Patient attempted to void on bedside commode for several minutes and was unable. Bladder scanned for 450 mls. Placed 16 french foley out put clear yellow urine returned. Patient tolerated well will contine to monitor.Ilean SkillVeronica Merlie Noga LPN

## 2017-12-28 NOTE — Progress Notes (Addendum)
Pt's BP 80/52- Paged MD to notify- received order for 1L NS bolus and transfuse 1 unit.  1553- Pt's BP 94/59. 1 unit transfusing. Will continue to monitor.

## 2017-12-29 DIAGNOSIS — R338 Other retention of urine: Secondary | ICD-10-CM

## 2017-12-29 DIAGNOSIS — S72002A Fracture of unspecified part of neck of left femur, initial encounter for closed fracture: Secondary | ICD-10-CM

## 2017-12-29 LAB — TYPE AND SCREEN
ABO/RH(D): O POS
ANTIBODY SCREEN: NEGATIVE
UNIT DIVISION: 0
Unit division: 0

## 2017-12-29 LAB — BPAM RBC
BLOOD PRODUCT EXPIRATION DATE: 201910102359
Blood Product Expiration Date: 201910102359
ISSUE DATE / TIME: 201909131544
Unit Type and Rh: 5100
Unit Type and Rh: 5100

## 2017-12-29 LAB — CBC
HEMATOCRIT: 28.6 % — AB (ref 36.0–46.0)
HEMOGLOBIN: 9.1 g/dL — AB (ref 12.0–15.0)
MCH: 24.3 pg — AB (ref 26.0–34.0)
MCHC: 31.8 g/dL (ref 30.0–36.0)
MCV: 76.5 fL — AB (ref 78.0–100.0)
Platelets: 347 10*3/uL (ref 150–400)
RBC: 3.74 MIL/uL — ABNORMAL LOW (ref 3.87–5.11)
RDW: 19.9 % — AB (ref 11.5–15.5)
WBC: 8.2 10*3/uL (ref 4.0–10.5)

## 2017-12-29 LAB — GLUCOSE, CAPILLARY
Glucose-Capillary: 98 mg/dL (ref 70–99)
Glucose-Capillary: 108 mg/dL — ABNORMAL HIGH (ref 70–99)
Glucose-Capillary: 93 mg/dL (ref 70–99)

## 2017-12-29 LAB — CORTISOL: Cortisol, Plasma: 16.9 ug/dL

## 2017-12-29 LAB — NICOTINE/COTININE METABOLITES
Cotinine: 212.7 ng/mL
Nicotine: 2 ng/mL

## 2017-12-29 MED ORDER — FERROUS SULFATE 325 (65 FE) MG PO TABS
325.0000 mg | ORAL_TABLET | Freq: Two times a day (BID) | ORAL | Status: DC
  Administered 2017-12-29 – 2017-12-30 (×2): 325 mg via ORAL
  Filled 2017-12-29 (×2): qty 1

## 2017-12-29 MED ORDER — OXYCODONE HCL 5 MG PO TABS: 5.0000 mg | ORAL_TABLET | ORAL | Status: DC | PRN

## 2017-12-29 MED ORDER — OXYCODONE HCL 5 MG PO TABS
10.0000 mg | ORAL_TABLET | ORAL | Status: DC | PRN
  Administered 2017-12-29 – 2017-12-30 (×4): 10 mg via ORAL
  Filled 2017-12-29 (×4): qty 2

## 2017-12-29 NOTE — Progress Notes (Signed)
PROGRESS NOTE                                                                                                                                                                                                             Patient Demographics:    Melanie Parsons, is a 62 y.o. female, DOB - 03/25/1956, ZOX:[id number]  Admit date - 12/25/2017   Admitting Physician Barnetta Chapel, MD  Outpatient Primary MD for the patient is [name], MD  LOS - 4  Outpatient Specialists:  No chief complaint on file.      Brief Narrative   62 year old female with history of anxiety, GERD, osteoarthritis, left periprosthetic femur fracture status post ORIF November 2018.  She had left hemiarthroplasty in 2016 from which she was recovering and ambulating well but fell and broke her femur distal to the prosthesis.  After fixing this he was doing touchdown weightbearing for about 3 months and started to ambulate.  However she started having pain.  For past 2 weeks she noticed significant deformity of her leg with increasing pain.  She was seen by her surgeon Dr. [name] who referred her to [facility] for repair of the malunion. Patient underwent removal of left femur hardware and repair of the nonunion.   Subjective:   Patient says her pain is better today but she is not fully involved in participating with PT.  Had low blood pressure (systolic in the 80s) yesterday for which she received normal saline bolus followed by maintenance fluid.  Also was given 1 unit PRBC for drop in H&H. Foley catheter placed in last evening for urinary retention.   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  Still requiring frequent pain medication as she is complaining of pain and soreness.  Continue  PRN hydrocodone and low-dose Dilaudid for  severe breakthrough pain. Robaxin for muscle spasms.  Continue bowel regimen.    Doppler of the left leg was done by orthopedics, negative for DVT (limited study due to pain in her thighs) PT recommends SNF but patient wants to go home with home health stating she has enough help from her family members. I discussed again with her that she is not participating with  PT very well and would benefit from going to SNF but she still refuses and wants to go home.  Active Problems:  Hypotension Better after fluid bolus and currently on maintenance fluid.  Has a low TSH and free T4 which can be worked up as outpatient.  A.m. cortisol was normal.  Not on any blood pressure meds.  Suspect hypovolemia with blood loss.  Monitor closely.  Iron deficiency anemia Further drop in H&H from postoperative blood loss.  Hemoccult pending.  Hemoglobin improved with 1 unit PRBC transfusion.  Will add iron supplement.  Acute urinary retention Foley placed back in yesterday evening.  Attempt voiding trial again in a.m.    COPD (chronic obstructive pulmonary disease) (HCC) Stable.  Resume home inhalers  Generalized anxiety disorder Continue Xanax  Chronic depression Stable.  Continue Remeron  GERD Continue PPI  Diabetes mellitus type 2, new onset Patient reports that she was never diagnosed with diabetes.  Denies any symptoms.  A1c of 7.2.  Monitor on sliding scale coverage for now.  Wants to control with diet.   Hypothyroidism Has suppressed both TSH and free T4.  No acute symptoms.  Needs outpatient monitoring.    E. coli UTI Asymptomatic.  Already received 3 days of antibiotics.  Code Status : Full code  Family Communication  : None at bedside  Disposition Plan  : Slowly improving.  If blood pressure remains stable and participating with PT will plan to discharge her home tomorrow.  Barriers For Discharge : Postop hypotension and urinary retention.  Consults  : Ortho ( Dr Jena Gauss)  Procedures   : ORIF left hip  DVT Prophylaxis  :  Lovenox -   Lab Results  Component Value Date   PLT 347 12/29/2017    Antibiotics  :   Anti-infectives (From admission, onward)   Start     Dose/Rate Route Frequency Ordered Stop   12/26/17 2200  ceFAZolin (ANCEF) IVPB 2g/100 mL premix  Status:  Discontinued     2 g 200 mL/hr over 30 Minutes Intravenous Every 8 hours 12/26/17 1804 12/27/17 1507   12/26/17 1459  tobramycin (NEBCIN) powder  Status:  Discontinued       As needed 12/26/17 1459 12/26/17 1500   12/26/17 1400  vancomycin (VANCOCIN) powder  Status:  Discontinued       As needed 12/26/17 1458 12/26/17 1500   12/26/17 1000  cefTRIAXone (ROCEPHIN) 1 g in sodium chloride 0.9 % 100 mL IVPB  Status:  Discontinued     1 g 200 mL/hr over 30 Minutes Intravenous Every 24 hours 12/26/17 0905 12/27/17 1507   12/26/17 0800  ceFAZolin (ANCEF) powder 2 g  Status:  Discontinued    Note to Pharmacy:  Give test dose in holding area   2 g Other To Surgery 12/25/17 2227 12/25/17 2244   12/26/17 0800  ceFAZolin (ANCEF) IVPB 2g/100 mL premix    Note to Pharmacy:  Give test dose in holding area   2 g 200 mL/hr over 30 Minutes Intravenous To Surgery 12/25/17 2244 12/26/17 1125        Objective:   Vitals:   12/28/17 1624 12/28/17 1838 12/28/17 2155 12/29/17 0547  BP: 105/61 99/72 100/70 102/68  Pulse: 88 92 94 96  Resp: 18 16 17 18   Temp: 98.8 F (37.1 C) 98.6 F (37 C) 98.1 F (36.7 C) 98 F (36.7 C)  TempSrc: Oral Oral Oral Oral  SpO2: 98% 100% 100% 100%  Weight:  Height:        Wt Readings from Last 3 Encounters:  12/25/17 47.1 kg     Intake/Output Summary (Last 24 hours) at 12/29/2017 0926 Last data filed at 12/29/2017 1610 Gross per 24 hour  Intake 2115.85 ml  Output 1800 ml  Net 315.85 ml   Physical exam Not in distress HEENT: Pallor present, moist mucosa, supple neck Chest: Clear bilaterally CVs: Normal S1 and S2, no murmurs GI: Soft, nondistended,  nontender Musculoskeletal: Clean dressing over the left hip, still with limited mobility, Foley draining clear urine      Data Review:    CBC Recent Labs  Lab 12/25/17 2028 12/26/17 0537 12/27/17 0424 12/28/17 0438 12/28/17 2022 12/29/17 0503  WBC 5.8 4.7 8.2 8.5  --  8.2  HGB 8.8* 8.6* 8.2* 7.7* 8.7* 9.1*  HCT 27.9* 27.5* 26.7* 24.9* 26.8* 28.6*  PLT 418* 423* 414* 361  --  347  MCV 74.2* 74.3* 75.2* 74.3*  --  76.5*  MCH 23.4* 23.2* 23.1* 23.0*  --  24.3*  MCHC 31.5 31.3 30.7 30.9  --  31.8  RDW 19.9* 20.1* 20.1* 19.6*  --  19.9*  LYMPHSABS 2.4  --   --   --   --   --   MONOABS 0.7  --   --   --   --   --   EOSABS 0.1  --   --   --   --   --   BASOSABS 0.1  --   --   --   --   --     Chemistries  Recent Labs  Lab 12/25/17 2028 12/26/17 0537 12/27/17 0424  NA 133* 136 132*  K 4.2 3.8 4.0  CL 102 107 102  CO2 20* 20* 23  GLUCOSE 111* 80 70  BUN 17 14 8   CREATININE 0.61 0.50 0.48  CALCIUM 8.9 8.7*  8.6* 8.6*  MG  --  1.7  --   AST  --  24  --   ALT  --  18  --   ALKPHOS  --  64  --   BILITOT  --  0.3  --    ------------------------------------------------------------------------------------------------------------------ No results for input(s): CHOL, HDL, LDLCALC, TRIG, CHOLHDL, LDLDIRECT in the last 72 hours.  Lab Results  Component Value Date   HGBA1C 7.2 (H) 12/26/2017   ------------------------------------------------------------------------------------------------------------------ No results for input(s): TSH, T4TOTAL, T3FREE, THYROIDAB in the last 72 hours.  Invalid input(s): FREET3 ------------------------------------------------------------------------------------------------------------------ Recent Labs    12/27/17 1116  FERRITIN 11  TIBC 340  IRON 18*    Coagulation profile Recent Labs  Lab 12/25/17 2316  INR 1.25    No results for input(s): DDIMER in the last 72 hours.  Cardiac Enzymes No results for input(s): CKMB,  TROPONINI, MYOGLOBIN in the last 168 hours.  Invalid input(s): CK ------------------------------------------------------------------------------------------------------------------ No results found for: BNP  Inpatient Medications  Scheduled Meds: . acetaminophen  500 mg Oral Q8H  . calcium citrate  200 mg of elemental calcium Oral BID  . cholecalciferol  2,000 Units Oral BID  . enoxaparin (LOVENOX) injection  40 mg Subcutaneous Q24H  . feeding supplement  1 Container Oral TID BM  . insulin aspart  0-15 Units Subcutaneous TID WC  . methocarbamol  500 mg Oral Q8H  . mirtazapine  30 mg Oral QHS  . multivitamin with minerals  1 tablet Oral Daily  . pantoprazole  40 mg Oral Daily  . vitamin C  500 mg Oral Daily  Continuous Infusions: . sodium chloride 100 mL/hr at 12/29/17 0330  . sodium chloride     PRN Meds:.ALPRAZolam, HYDROmorphone (DILAUDID) injection, ondansetron **OR** ondansetron (ZOFRAN) IV, oxyCODONE, oxyCODONE  Micro Results Recent Results (from the past 240 hour(s))  Surgical pcr screen     Status: None   Collection Time: 12/26/17 12:18 AM  Result Value Ref Range Status   MRSA, PCR NEGATIVE NEGATIVE Final   Staphylococcus aureus NEGATIVE NEGATIVE Final    Comment: (NOTE) The Xpert SA Assay (FDA approved for NASAL specimens in patients 63 years of age and older), is one component of a comprehensive surveillance program. It is not intended to diagnose infection nor to guide or monitor treatment. Performed at [facility], [address] [place], [place] [address]   Culture, Urine     Status: Abnormal   Collection Time: 12/26/17 10:11 AM  Result Value Ref Range Status   Specimen Description URINE, RANDOM  Final   Special Requests   Final    NONE Performed at [facility], [address] [place], [place] [address]    Culture >=100,000 COLONIES/mL ESCHERICHIA COLI (A)  Final   Report Status 12/28/2017 FINAL  Final   Organism ID, Bacteria  ESCHERICHIA COLI (A)  Final      Susceptibility   Escherichia coli - MIC*    AMPICILLIN >=32 RESISTANT Resistant     CEFAZOLIN <=4 SENSITIVE Sensitive     CEFTRIAXONE <=1 SENSITIVE Sensitive     CIPROFLOXACIN <=0.25 SENSITIVE Sensitive     GENTAMICIN <=1 SENSITIVE Sensitive     IMIPENEM <=0.25 SENSITIVE Sensitive     NITROFURANTOIN <=16 SENSITIVE Sensitive     TRIMETH/SULFA <=20 SENSITIVE Sensitive     AMPICILLIN/SULBACTAM 16 INTERMEDIATE Intermediate     PIP/TAZO <=4 SENSITIVE Sensitive     Extended ESBL NEGATIVE Sensitive     * >=100,000 COLONIES/mL ESCHERICHIA COLI  Aerobic/Anaerobic Culture (surgical/deep wound)     Status: None (Preliminary result)   Collection Time: 12/26/17 12:21 PM  Result Value Ref Range Status   Specimen Description TISSUE LEFT LEG FEMUR NON UNION SITE  Final   Special Requests PATIENT ON FOLLOWING ANCEF  Final   Gram Stain   Final    FEW WBC PRESENT, PREDOMINANTLY PMN NO ORGANISMS SEEN    Culture   Final    NO GROWTH 2 DAYS NO ANAEROBES ISOLATED; CULTURE IN PROGRESS FOR 5 DAYS Performed at [facility], [address] [place], [place] [address]    Report Status PENDING  Incomplete    Radiology Reports Dg C-arm 1-60 Min  Result Date: 12/26/2017 CLINICAL DATA:  Revision of left femur plate and screws with cable. EXAM: LEFT FEMUR 2 VIEWS; DG C-ARM 61-120 MIN 169 seconds under fluoroscopy. COMPARISON:  None. FINDINGS: Patient status post revision of left femoral plate and screws. At the end of the procedure, a orthopedic side plate fixated by horizontal screws is identified in the femoral shaft through fracture of the mid femoral shaft. No malalignment is identified. Left femoral hemiarthroplasty is identified. IMPRESSION: Revision of the left femur without malalignment as described. Electronically Signed   By: Sherian ReinWei-Chen  Lin M.D.   On: 12/26/2017 15:22   Dg C-arm 1-60 Min  Result Date: 12/26/2017 CLINICAL DATA:  Revision of left femur plate and  screws with cable. EXAM: LEFT FEMUR 2 VIEWS; DG C-ARM 61-120 MIN 169 seconds under fluoroscopy. COMPARISON:  None. FINDINGS: Patient status post revision of left femoral plate and screws. At  the end of the procedure, a orthopedic side plate fixated by horizontal screws is identified in the femoral shaft through fracture of the mid femoral shaft. No malalignment is identified. Left femoral hemiarthroplasty is identified. IMPRESSION: Revision of the left femur without malalignment as described. Electronically Signed   By: Sherian Rein M.D.   On: 12/26/2017 15:22   Dg C-arm 1-60 Min  Result Date: 12/26/2017 CLINICAL DATA:  Revision of left femur plate and screws with cable. EXAM: LEFT FEMUR 2 VIEWS; DG C-ARM 61-120 MIN 169 seconds under fluoroscopy. COMPARISON:  None. FINDINGS: Patient status post revision of left femoral plate and screws. At the end of the procedure, a orthopedic side plate fixated by horizontal screws is identified in the femoral shaft through fracture of the mid femoral shaft. No malalignment is identified. Left femoral hemiarthroplasty is identified. IMPRESSION: Revision of the left femur without malalignment as described. Electronically Signed   By: Sherian Rein M.D.   On: 12/26/2017 15:22   Dg Femur Min 2 Views Left  Result Date: 12/26/2017 CLINICAL DATA:  Revision of left femur plate and screws with cable. EXAM: LEFT FEMUR 2 VIEWS; DG C-ARM 61-120 MIN 169 seconds under fluoroscopy. COMPARISON:  None. FINDINGS: Patient status post revision of left femoral plate and screws. At the end of the procedure, a orthopedic side plate fixated by horizontal screws is identified in the femoral shaft through fracture of the mid femoral shaft. No malalignment is identified. Left femoral hemiarthroplasty is identified. IMPRESSION: Revision of the left femur without malalignment as described. Electronically Signed   By: Sherian Rein M.D.   On: 12/26/2017 15:22   Dg Femur Port Min 2 Views  Left  Result Date: 12/26/2017 CLINICAL DATA:  Revision of left femur fixation hardware EXAM: LEFT FEMUR PORTABLE 2 VIEWS COMPARISON:  12/25/2017 left femur radiographs FINDINGS: Near-anatomic alignment of proximal left femoral shaft comminuted fracture status post transfixation by lateral surgical plate with multiple interlocking screws and proximal cerclage wire. Left hip hemiarthroplasty. No evidence of hardware fracture or loosening. No dislocation at the left hip or left knee. Small sclerotic lesion in the distal metaphysis of the left femur without aggressive features. Diffuse osteopenia. Expected postoperative soft tissue gas surrounding the left femur. IMPRESSION: Near-anatomic alignment of proximal left femoral shaft fracture status post ORIF. Electronically Signed   By: Delbert Phenix M.D.   On: 12/26/2017 17:30   Dg Femur Port Min 2 Views Left  Result Date: 12/25/2017 CLINICAL DATA:  Femur fracture EXAM: LEFT FEMUR PORTABLE 2 VIEWS COMPARISON:  02/26/2017 FINDINGS: Previous left hip replacement with normal alignment, screw, and cerclage fixation of the femoral shaft. Comminuted fracture at the proximal shaft of the femur at the site of prior fracture with new moderate varus angulation of the distal femur. Up lifting of the proximal fixating plate from the cortex of the femur with screw fragment separated from the fixating plate. Additional faint linear lucency within the distal shaft of the femur just caudal to the fixating plate. Stable sclerotic focus in the distal shaft of the femur. IMPRESSION: 1. Prior left hip replacement and surgical plate, screw and cerclage wire fixation of the shaft of the femur. Comminuted fracture at the proximal shaft of the femur in the region of the previous fracture, now with new moderate varus angulation of the distal femur 2. Interim finding of displacement of the proximal portion of the fixating plate and screws from the shaft of the femur. Electronically Signed    By: Jasmine Pang  M.D.   On: 12/25/2017 23:41    Time Spent in minutes  25   Jovoni Borkenhagen M.D on 12/29/2017 at 9:26 AM  Between 7am to 7pm - Pager - [phone]  After 7pm go to [email or web address] - password [email or web address]  [facility] -  Office  [phone]

## 2017-12-29 NOTE — Progress Notes (Signed)
Orthopaedic Trauma Progress Note  S: Continues with significant pain, unable to do much with therapy yesterday. Foley catheter placed last night due to urinary retention. Does not want to go to SNF but not moving much in hospital. Received blood transfusion yesterday  O:  Vitals:   12/28/17 2155 12/29/17 0547  BP: 100/70 102/68  Pulse: 94 96  Resp: 17 18  Temp: 98.1 F (36.7 C) 98 F (36.7 C)  SpO2: 100% 100%   General: No acute distress, awake alert and oriented Left lower extremity: Thigh sore but swelling appropriate.  Incision is clean dry and intact no signs of drainage.  Compartments are soft compressible.  Active dorsiflexion plantarflexion of her toe and foot.  Positive DP pulse.  Imaging: Stable postop imaging  Labs:  Results for orders placed or performed during the hospital encounter of 12/25/17 (from the past 24 hour(s))  Glucose, capillary     Status: None   Collection Time: 12/28/17  8:37 AM  Result Value Ref Range   Glucose-Capillary 90 70 - 99 mg/dL  Glucose, capillary     Status: None   Collection Time: 12/28/17 12:30 PM  Result Value Ref Range   Glucose-Capillary 84 70 - 99 mg/dL  Prepare RBC     Status: None   Collection Time: 12/28/17  3:01 PM  Result Value Ref Range   Order Confirmation      BB SAMPLE OR UNITS ALREADY AVAILABLE Performed at [facility], [address] [place], [place] [address]   Glucose, capillary     Status: Abnormal   Collection Time: 12/28/17  5:26 PM  Result Value Ref Range   Glucose-Capillary 100 (H) 70 - 99 mg/dL  Hemoglobin and hematocrit, blood     Status: Abnormal   Collection Time: 12/28/17  8:22 PM  Result Value Ref Range   Hemoglobin 8.7 (L) 12.0 - 15.0 g/dL   HCT 91.4 (L) 78.2 - 95.6 %  Glucose, capillary     Status: Abnormal   Collection Time: 12/28/17  9:54 PM  Result Value Ref Range   Glucose-Capillary 110 (H) 70 - 99 mg/dL  Cortisol     Status: None   Collection Time: 12/29/17  5:03 AM  Result Value  Ref Range   Cortisol, Plasma 16.9 ug/dL  CBC     Status: Abnormal   Collection Time: 12/29/17  5:03 AM  Result Value Ref Range   WBC 8.2 4.0 - 10.5 K/uL   RBC 3.74 (L) 3.87 - 5.11 MIL/uL   Hemoglobin 9.1 (L) 12.0 - 15.0 g/dL   HCT 21.3 (L) 08.6 - 57.8 %   MCV 76.5 (L) 78.0 - 100.0 fL   MCH 24.3 (L) 26.0 - 34.0 pg   MCHC 31.8 30.0 - 36.0 g/dL   RDW 46.9 (H) 62.9 - 52.8 %   Platelets 347 150 - 400 K/uL    62 y/o female with L periprosthetic femur fracture nonunion, new dx of DM 2   -L periprosthetic femur nonunion              TDWB L leg             ROM as tolerated L knee             PT/OT             PRN dressings                 Ice PRN                   -  Pain management:                         Tylenol 500 mg po q8h scheduled                         norco 7.5/325 1-2 po q8h prn                          Robaxin 500 mg po q8h scheduled                          - ABL anemia/Hemodynamics             Received 1 unit last night responded appropriately Hgb 9.1 this AM              - Medical issues              New dx of DM                         Per medicine                         Follow up with PCP   Sliding scale insulin  - DVT/PE prophylaxis:             Lovenox              Check doppler of L leg-negative   - ID:              periop abx -completed  - Metabolic Bone Disease:             + vitamin d deficiency                          Supplementation                          Additional bone health labs notable for depressed TSH and free t4, recheck in 12 weeks or so                          PTH reasonable                                      Will attempt to get pt bone growth stimulator for her chronic nonunion   - Activity:             TDWB L leg              Therapies   - FEN/GI prophylaxis/Foley/Lines:             CHO mod diet              DM education   - Impediments to fracture healing:             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

## 2017-12-29 NOTE — Plan of Care (Signed)
  Problem: Activity: Goal: Risk for activity intolerance will decrease Outcome: Progressing   

## 2017-12-29 NOTE — Plan of Care (Signed)
  Problem: Activity: Goal: Risk for activity intolerance will decrease Outcome: Progressing   Problem: Pain Managment: Goal: General experience of comfort will improve Outcome: Progressing   

## 2017-12-30 DIAGNOSIS — R338 Other retention of urine: Secondary | ICD-10-CM

## 2017-12-30 DIAGNOSIS — D509 Iron deficiency anemia, unspecified: Secondary | ICD-10-CM | POA: Diagnosis present

## 2017-12-30 DIAGNOSIS — F411 Generalized anxiety disorder: Secondary | ICD-10-CM

## 2017-12-30 DIAGNOSIS — E119 Type 2 diabetes mellitus without complications: Secondary | ICD-10-CM

## 2017-12-30 DIAGNOSIS — I959 Hypotension, unspecified: Secondary | ICD-10-CM | POA: Diagnosis present

## 2017-12-30 DIAGNOSIS — D62 Acute posthemorrhagic anemia: Secondary | ICD-10-CM | POA: Diagnosis present

## 2017-12-30 DIAGNOSIS — B9620 Unspecified Escherichia coli [E. coli] as the cause of diseases classified elsewhere: Secondary | ICD-10-CM | POA: Diagnosis present

## 2017-12-30 DIAGNOSIS — E440 Moderate protein-calorie malnutrition: Secondary | ICD-10-CM

## 2017-12-30 DIAGNOSIS — N390 Urinary tract infection, site not specified: Secondary | ICD-10-CM

## 2017-12-30 LAB — GLUCOSE, CAPILLARY
Glucose-Capillary: 94 mg/dL (ref 70–99)
Glucose-Capillary: 109 mg/dL — ABNORMAL HIGH (ref 70–99)

## 2017-12-30 MED ORDER — FERROUS SULFATE 325 (65 FE) MG PO TABS: 325.0000 mg | ORAL_TABLET | Freq: Two times a day (BID) | ORAL | 0 refills | Status: DC

## 2017-12-30 MED ORDER — ADULT MULTIVITAMIN W/MINERALS CH: 1.0000 | ORAL_TABLET | Freq: Every day | ORAL | 0 refills | Status: DC

## 2017-12-30 MED ORDER — ASCORBIC ACID 500 MG PO TABS: 500.0000 mg | ORAL_TABLET | Freq: Every day | ORAL | 0 refills | Status: DC

## 2017-12-30 MED ORDER — CHOLECALCIFEROL 50 MCG (2000 UT) PO TABS: 2000.0000 [IU] | ORAL_TABLET | Freq: Two times a day (BID) | ORAL | 0 refills | Status: DC

## 2017-12-30 MED ORDER — BOOST PLUS PO LIQD: 237.0000 mL | Freq: Three times a day (TID) | ORAL | 0 refills | Status: DC

## 2017-12-30 MED ORDER — METHOCARBAMOL 500 MG PO TABS: 500.0000 mg | ORAL_TABLET | Freq: Three times a day (TID) | ORAL | 0 refills | Status: DC | PRN

## 2017-12-30 MED ORDER — ENOXAPARIN SODIUM 40 MG/0.4ML ~~LOC~~ SOLN: 40.0000 mg | SUBCUTANEOUS | 0 refills | Status: DC

## 2017-12-30 MED ORDER — HYDROCODONE-ACETAMINOPHEN 5-325 MG PO TABS: 2.0000 | ORAL_TABLET | Freq: Four times a day (QID) | ORAL | 0 refills | Status: DC | PRN

## 2017-12-30 MED ORDER — CALCIUM CITRATE 950 (200 CA) MG PO TABS: 200.0000 mg | ORAL_TABLET | Freq: Two times a day (BID) | ORAL | 0 refills | Status: DC

## 2017-12-30 MED ORDER — POLYETHYLENE GLYCOL 3350 17 G PO PACK: 17.0000 g | PACK | Freq: Every day | ORAL | 0 refills | Status: DC | PRN

## 2017-12-30 NOTE — Progress Notes (Signed)
Patient discharged to home. Verbalizes understanding of all discharge instructions including incision care, discharge medications, and follow up MD visits. Patient accompanied by spouse and daughter.  

## 2017-12-30 NOTE — Discharge Summary (Signed)
Physician Discharge Summary  Melanie Parsons AVW:[id number] DOB: 01-16-1956 DOA: 12/25/2017  PCP: [name], MD  Admit date: 12/25/2017 Discharge date: 12/30/2017  Admitted From: Home Disposition: Home with home health (patient refused SNF)  Recommendations for Outpatient Follow-up:  1. Follow up with PCP in 1 week.  Please monitor H&H.  Please check thyroid function in about 4 weeks. 2. Follow-up with orthopedic surgeon in 2 weeks.  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  She had left hemiarthroplasty in 2016 from which she was recovering and ambulating well but fell and broke her femur distal to the prosthesis.  After fixing this he was doing touchdown weightbearing for about 3 months and started to ambulate.  However she started having pain.  For past 2 weeks she noticed significant deformity of her leg with increasing pain.  She was seen by her surgeon Dr. [name] who referred her to [facility] for repair of the malunion. Patient underwent removal of left femur hardware and repair of the nonunion.  Hospital course  Principal Problem:   Closed displaced oblique fracture of shaft of left femur with  nonunion Status post removal of hardware and ORIF.  Still requiring frequent pain medication as she is complaining of pain and soreness.   Tolerating PRN oxycodone and Robaxin for muscle spasm.  We will discharge her on Vicodin and Robaxin.  Added bowel regimen.  Added calcium and vitamin D.  Doppler of the left leg was done by orthopedics, negative for DVT (limited study due to pain in her thighs) PT recommends SNF but patient wants to go home with home health stating she has enough help from her family members.  DVT prophylaxis with Lovenox for 30 days.  Home health PT and OT ordered.  Patient reports having a wheelchair, rolling walker and a bedside commode at home. Follow-up with orthopedics in 2 weeks.  Active Problems:  Hypotension Suspect hypovolemia with blood loss.  Received intermittent IV fluid bolus and maintenance normal saline with improvement.  A.m. cortisol normal.  Has low thyroid function which can be followed as outpatient.  Iron deficiency anemia Further drop in H&H from postoperative blood loss.  Hemoccult pending.  Hemoglobin improved with 1 unit PRBC transfusion.    Added iron supplement.  Patient reports being on iron supplement in the past.  Acute urinary retention Required Foley to be placed back in.  Removed this morning.  Will make sure patient able to void before being discharged.  COPD (chronic obstructive pulmonary disease) (HCC) Stable.  Resume home inhalers  Generalized anxiety disorder Continue Xanax  Chronic depression Stable.  Continue Remeron  GERD Continue PPI  Diabetes mellitus type 2, new onset Patient reports that she was never diagnosed with diabetes.  Denies any symptoms.  A1c of 7.2.    Does not want to take any medication for now and control it with diet.  Follow with PCP.  Moderate protein calorie malnutrition Added supplement   Hypothyroidism Has suppressed both TSH and free T4.  No acute symptoms.    Please repeat  thyroid function in about 4 weeks.   E. coli UTI Asymptomatic.   received 3 days of antibiotics.    Family Communication  : None at bedside  Disposition Plan  :  Home  Consults  : Ortho ( Dr Jena GaussHaddix)  Procedures  : ORIF left hip   Discharge Instructions   Allergies as of 12/30/2017      Reactions   Penicillins Rash   Rash and itching      Medication List    STOP taking these medications   GOODY HEADACHE PO     TAKE these medications   acetaminophen 500 MG tablet Commonly known as:  TYLENOL Take 1,000 mg by mouth as needed for mild pain.   AFRIN NODRIP SEVERE CONGEST 0.05 % nasal spray Generic drug:  oxymetazoline Place 1 spray into both nostrils as needed for congestion.   ALPRAZolam 1 MG tablet Commonly known as:  XANAX Take 1 mg by mouth 3 (three) times daily.   ascorbic acid 500 MG tablet Commonly known as:  VITAMIN C Take 1 tablet (500 mg total) by mouth daily. Start taking on:  12/31/2017   calcium citrate 950 MG tablet Commonly known as:  CALCITRATE - dosed in mg elemental calcium Take 1 tablet (200 mg of elemental calcium total) by mouth 2 (two) times daily.   Cholecalciferol 2000 units Tabs Take 1 tablet (2,000 Units total) by mouth 2 (two) times daily.   enoxaparin 40 MG/0.4ML injection Commonly known as:  LOVENOX Inject 0.4 mLs (40 mg total) into the skin daily.   ferrous sulfate 325 (65 FE) MG tablet Take 1 tablet (325 mg total) by mouth 2 (two) times daily with a meal.   HYDROcodone-acetaminophen 5-325 MG tablet Commonly known as:  NORCO/VICODIN Take 2 tablets by mouth every 6 (six) hours as needed for moderate pain.   lactose free nutrition Liqd Take 237 mLs by mouth 3 (three) times daily with meals.   methocarbamol 500 MG tablet Commonly known as:  ROBAXIN Take 1 tablet (500 mg total) by mouth every 8 (eight) hours as needed for muscle spasms.  MiraLAX 17 g packet Take 1 packet daily as needed for constipation.   mirtazapine 30 MG tablet Commonly known as:  REMERON Take 30 mg by mouth at bedtime.   multivitamin with minerals Tabs tablet Take 1 tablet by mouth daily. Start taking on:  12/31/2017   omeprazole 20 MG capsule Commonly known as:  PRILOSEC Take 20 mg by mouth daily.      Follow-up Information    [facility] - Dme Follow up.   Contact information: [address] [place] [address] [phone]        [name], [name], MD Follow up in 1 week(s).   Specialty:  Family Medicine Contact information: [address] [place] [address] [phone]        Roby LoftsHaddix, Kevin P, MD. Schedule an appointment as soon as possible for a visit in 2 week(s).   Specialty:  Orthopedic Surgery Contact information: [address] [place] [address] [phone]          Allergies  Allergen Reactions  . Penicillins Rash  Rash and itching        Procedures/Studies: Dg C-arm 1-60 Min  Result Date: 12/26/2017 CLINICAL DATA:  Revision of left femur plate and screws with cable. EXAM: LEFT FEMUR 2 VIEWS; DG C-ARM 61-120 MIN 169 seconds under fluoroscopy. COMPARISON:  None. FINDINGS: Patient status post revision of left femoral plate and screws. At the end of the procedure, a orthopedic side plate fixated by horizontal screws is identified in the femoral shaft through fracture of the mid femoral shaft. No malalignment is identified. Left femoral hemiarthroplasty is identified. IMPRESSION: Revision of the left femur without malalignment as described. Electronically Signed   By: Sherian Rein M.D.   On: 12/26/2017 15:22   Dg C-arm 1-60 Min  Result Date: 12/26/2017 CLINICAL DATA:  Revision of left femur plate and screws with cable. EXAM: LEFT FEMUR 2 VIEWS; DG C-ARM 61-120 MIN 169 seconds under fluoroscopy. COMPARISON:  None. FINDINGS: Patient status post revision of left femoral plate and screws. At the end of the procedure, a orthopedic side plate  fixated by horizontal screws is identified in the femoral shaft through fracture of the mid femoral shaft. No malalignment is identified. Left femoral hemiarthroplasty is identified. IMPRESSION: Revision of the left femur without malalignment as described. Electronically Signed   By: Sherian Rein M.D.   On: 12/26/2017 15:22   Dg C-arm 1-60 Min  Result Date: 12/26/2017 CLINICAL DATA:  Revision of left femur plate and screws with cable. EXAM: LEFT FEMUR 2 VIEWS; DG C-ARM 61-120 MIN 169 seconds under fluoroscopy. COMPARISON:  None. FINDINGS: Patient status post revision of left femoral plate and screws. At the end of the procedure, a orthopedic side plate fixated by horizontal screws is identified in the femoral shaft through fracture of the mid femoral shaft. No malalignment is identified. Left femoral hemiarthroplasty is identified. IMPRESSION: Revision of the left femur without malalignment as described. Electronically Signed   By: Sherian Rein M.D.   On: 12/26/2017 15:22   Dg Femur Min 2 Views Left  Result Date: 12/26/2017 CLINICAL DATA:  Revision of left femur plate and screws with cable. EXAM: LEFT FEMUR 2 VIEWS; DG C-ARM 61-120 MIN 169 seconds under fluoroscopy. COMPARISON:  None. FINDINGS: Patient status post revision of left femoral plate and screws. At the end of the procedure, a orthopedic side plate fixated by horizontal screws is identified in the femoral shaft through fracture of the mid femoral shaft. No malalignment is identified. Left femoral hemiarthroplasty is identified. IMPRESSION: Revision of the left femur without malalignment as described. Electronically Signed   By: Sherian Rein M.D.   On: 12/26/2017 15:22   Dg Femur Port Min 2 Views Left  Result Date: 12/26/2017 CLINICAL DATA:  Revision of left femur fixation hardware EXAM: LEFT FEMUR PORTABLE 2 VIEWS COMPARISON:  12/25/2017 left femur radiographs FINDINGS: Near-anatomic alignment of proximal left femoral shaft comminuted  fracture status post transfixation by lateral surgical plate with multiple interlocking screws and proximal cerclage wire. Left hip hemiarthroplasty. No evidence of hardware fracture or loosening. No dislocation at the left hip or left knee. Small sclerotic lesion in the distal metaphysis of the left femur without aggressive features. Diffuse osteopenia. Expected postoperative soft tissue gas surrounding the left femur. IMPRESSION: Near-anatomic alignment of proximal left femoral shaft fracture status post ORIF. Electronically Signed   By: Delbert Phenix M.D.   On: 12/26/2017 17:30   Dg Femur Port Min 2 Views Left  Result Date: 12/25/2017 CLINICAL DATA:  Femur fracture EXAM: LEFT FEMUR PORTABLE  2 VIEWS COMPARISON:  02/26/2017 FINDINGS: Previous left hip replacement with normal alignment, screw, and cerclage fixation of the femoral shaft. Comminuted fracture at the proximal shaft of the femur at the site of prior fracture with new moderate varus angulation of the distal femur. Up lifting of the proximal fixating plate from the cortex of the femur with screw fragment separated from the fixating plate. Additional faint linear lucency within the distal shaft of the femur just caudal to the fixating plate. Stable sclerotic focus in the distal shaft of the femur. IMPRESSION: 1. Prior left hip replacement and surgical plate, screw and cerclage wire fixation of the shaft of the femur. Comminuted fracture at the proximal shaft of the femur in the region of the previous fracture, now with new moderate varus angulation of the distal femur 2. Interim finding of displacement of the proximal portion of the fixating plate and screws from the shaft of the femur. Electronically Signed   By: Jasmine Pang M.D.   On: 12/25/2017 23:41       Subjective: Reports pain better controlled on current meds.  No overnight events.  Foley removed this morning.  Discharge Exam: Vitals:   12/30/17 0518 12/30/17 0643  BP: 125/73    Pulse: 100   Resp: 20   Temp: 100.3 F (37.9 C) 99.5 F (37.5 C)  SpO2: 93%    Vitals:   12/29/17 1334 12/29/17 2056 12/30/17 0518 12/30/17 0643  BP: 108/69 96/65 125/73   Pulse: 72 85 100   Resp:  18 20   Temp: 98.9 F (37.2 C) 98.8 F (37.1 C) 100.3 F (37.9 C) 99.5 F (37.5 C)  TempSrc: Oral Oral Oral Oral  SpO2: 98% 93% 93%   Weight:      Height:        Not in distress HEENT: Pallor present, moist mucosa, supple neck Chest: Clear bilaterally CVs: Normal S1 and S2, no murmurs GI: Soft, nondistended, nontender Musculoskeletal: Clean dressing over the left hip, still with limited mobility,     The results of significant diagnostics from this hospitalization (including imaging, microbiology, ancillary and laboratory) are listed below for reference.     Microbiology: Recent Results (from the past 240 hour(s))  Surgical pcr screen     Status: None   Collection Time: 12/26/17 12:18 AM  Result Value Ref Range Status   MRSA, PCR NEGATIVE NEGATIVE Final   Staphylococcus aureus NEGATIVE NEGATIVE Final    Comment: (NOTE) The Xpert SA Assay (FDA approved for NASAL specimens in patients 31 years of age and older), is one component of a comprehensive surveillance program. It is not intended to diagnose infection nor to guide or monitor treatment. Performed at [facility], [address], [place], [place] [address]   Culture, Urine     Status: Abnormal   Collection Time: 12/26/17 10:11 AM  Result Value Ref Range Status   Specimen Description URINE, RANDOM  Final   Special Requests   Final    NONE Performed at [facility], [address] [place], [place] [address]    Culture >=100,000 COLONIES/mL ESCHERICHIA COLI (A)  Final   Report Status 12/28/2017 FINAL  Final   Organism ID, Bacteria ESCHERICHIA COLI (A)  Final      Susceptibility   Escherichia coli - MIC*    AMPICILLIN >=32 RESISTANT Resistant     CEFAZOLIN <=4 SENSITIVE Sensitive      CEFTRIAXONE <=1 SENSITIVE Sensitive     CIPROFLOXACIN <=0.25 SENSITIVE  Sensitive     GENTAMICIN <=1 SENSITIVE Sensitive     IMIPENEM <=0.25 SENSITIVE Sensitive     NITROFURANTOIN <=16 SENSITIVE Sensitive     TRIMETH/SULFA <=20 SENSITIVE Sensitive     AMPICILLIN/SULBACTAM 16 INTERMEDIATE Intermediate     PIP/TAZO <=4 SENSITIVE Sensitive     Extended ESBL NEGATIVE Sensitive     * >=100,000 COLONIES/mL ESCHERICHIA COLI  Aerobic/Anaerobic Culture (surgical/deep wound)     Status: None (Preliminary result)   Collection Time: 12/26/17 12:21 PM  Result Value Ref Range Status   Specimen Description TISSUE LEFT LEG FEMUR NON UNION SITE  Final   Special Requests PATIENT ON FOLLOWING ANCEF  Final   Gram Stain   Final    FEW WBC PRESENT, PREDOMINANTLY PMN NO ORGANISMS SEEN    Culture   Final    NO GROWTH 3 DAYS NO ANAEROBES ISOLATED; CULTURE IN PROGRESS FOR 5 DAYS Performed at [facility], [address], [place], [place] [address]    Report Status PENDING  Incomplete     Labs: BNP (last 3 results) No results for input(s): BNP in the last 8760 hours. Basic Metabolic Panel: Recent Labs  Lab 12/25/17 2028 12/26/17 0537 12/27/17 0424  NA 133* 136 132*  K 4.2 3.8 4.0  CL 102 107 102  CO2 20* 20* 23  GLUCOSE 111* 80 70  BUN 17 14 8   CREATININE 0.61 0.50 0.48  CALCIUM 8.9 8.7*  8.6* 8.6*  MG  --  1.7  --   PHOS  --  3.8  --    Liver Function Tests: Recent Labs  Lab 12/26/17 0537  AST 24  ALT 18  ALKPHOS 64  BILITOT 0.3  PROT 5.7*  ALBUMIN 2.6*   No results for input(s): LIPASE, AMYLASE in the last 168 hours. No results for input(s): AMMONIA in the last 168 hours. CBC: Recent Labs  Lab 12/25/17 2028 12/26/17 0537 12/27/17 0424 12/28/17 0438 12/28/17 2022 12/29/17 0503  WBC 5.8 4.7 8.2 8.5  --  8.2  NEUTROABS 2.6  --   --   --   --   --   HGB 8.8* 8.6* 8.2* 7.7* 8.7* 9.1*  HCT 27.9* 27.5* 26.7* 24.9* 26.8* 28.6*  MCV 74.2* 74.3* 75.2* 74.3*  --  76.5*  PLT  418* 423* 414* 361  --  347   Cardiac Enzymes: No results for input(s): CKTOTAL, CKMB, CKMBINDEX, TROPONINI in the last 168 hours. BNP: Invalid input(s): POCBNP CBG: Recent Labs  Lab 12/28/17 2154 12/29/17 0743 12/29/17 1201 12/29/17 1659 12/30/17 0750  GLUCAP 110* 98 108* 93 94   D-Dimer No results for input(s): DDIMER in the last 72 hours. Hgb A1c No results for input(s): HGBA1C in the last 72 hours. Lipid Profile No results for input(s): CHOL, HDL, LDLCALC, TRIG, CHOLHDL, LDLDIRECT in the last 72 hours. Thyroid function studies No results for input(s): TSH, T4TOTAL, T3FREE, THYROIDAB in the last 72 hours.  Invalid input(s): FREET3 Anemia work up Recent Labs    12/27/17 1116  FERRITIN 11  TIBC 340  IRON 18*   Urinalysis    Component Value Date/Time   COLORURINE YELLOW 12/26/2017 0429   APPEARANCEUR HAZY (A) 12/26/2017 0429   LABSPEC 1.016 12/26/2017 0429   PHURINE 5.0 12/26/2017 0429   GLUCOSEU NEGATIVE 12/26/2017 0429   HGBUR SMALL (A) 12/26/2017 0429   BILIRUBINUR NEGATIVE 12/26/2017 0429   KETONESUR 5 (A) 12/26/2017 0429   PROTEINUR NEGATIVE 12/26/2017 0429   NITRITE POSITIVE (A) 12/26/2017 0429  LEUKOCYTESUR LARGE (A) 12/26/2017 0429   Sepsis Labs Invalid input(s): PROCALCITONIN,  WBC,  LACTICIDVEN Microbiology Recent Results (from the past 240 hour(s))  Surgical pcr screen     Status: None   Collection Time: 12/26/17 12:18 AM  Result Value Ref Range Status   MRSA, PCR NEGATIVE NEGATIVE Final   Staphylococcus aureus NEGATIVE NEGATIVE Final    Comment: (NOTE) The Xpert SA Assay (FDA approved for NASAL specimens in patients 32 years of age and older), is one component of a comprehensive surveillance program. It is not intended to diagnose infection nor to guide or monitor treatment. Performed at [facility], [address], [place], [place] [address]   Culture, Urine     Status: Abnormal   Collection Time: 12/26/17 10:11 AM  Result Value  Ref Range Status   Specimen Description URINE, RANDOM  Final   Special Requests   Final    NONE Performed at [facility], [address], [place], [place] [address]    Culture >=100,000 COLONIES/mL ESCHERICHIA COLI (A)  Final   Report Status 12/28/2017 FINAL  Final   Organism ID, Bacteria ESCHERICHIA COLI (A)  Final      Susceptibility   Escherichia coli - MIC*    AMPICILLIN >=32 RESISTANT Resistant     CEFAZOLIN <=4 SENSITIVE Sensitive     CEFTRIAXONE <=1 SENSITIVE Sensitive     CIPROFLOXACIN <=0.25 SENSITIVE Sensitive     GENTAMICIN <=1 SENSITIVE Sensitive     IMIPENEM <=0.25 SENSITIVE Sensitive     NITROFURANTOIN <=16 SENSITIVE Sensitive     TRIMETH/SULFA <=20 SENSITIVE Sensitive     AMPICILLIN/SULBACTAM 16 INTERMEDIATE Intermediate     PIP/TAZO <=4 SENSITIVE Sensitive     Extended ESBL NEGATIVE Sensitive     * >=100,000 COLONIES/mL ESCHERICHIA COLI  Aerobic/Anaerobic Culture (surgical/deep wound)     Status: None (Preliminary result)   Collection Time: 12/26/17 12:21 PM  Result Value Ref Range Status   Specimen Description TISSUE LEFT LEG FEMUR NON UNION SITE  Final   Special Requests PATIENT ON FOLLOWING ANCEF  Final   Gram Stain   Final    FEW WBC PRESENT, PREDOMINANTLY PMN NO ORGANISMS SEEN    Culture   Final    NO GROWTH 3 DAYS NO ANAEROBES ISOLATED; CULTURE IN PROGRESS FOR 5 DAYS Performed at [facility], [address], [place], [place] [address]    Report Status PENDING  Incomplete     Time coordinating discharge: 35  minutes  SIGNED:   Eddie North, MD  [facility] 12/30/2017, 9:56 AM Pager   If 7PM-7AM, please contact night-coverage [email or web address] Password [email or web address]

## 2017-12-30 NOTE — Care Management Note (Signed)
Case Management Note  Patient Details  Name: Melanie Parsons MRN: [id number] Date of Birth: [birth year]  Subjective/Objective:                    Action/Plan: [facility] CM notified Jermaine with [facility] of the patients d/c.   Expected Discharge Date:  12/30/17               Expected Discharge Plan:  Home w Home Health Services  In-House Referral:     Discharge planning Services  CM Consult  Post Acute Care Choice:  Durable Medical Equipment, Home Health Choice offered to:  Patient  DME Arranged:  N/A DME Agency:  NA  HH Arranged:    HH Agency:  [facility]  Status of Service:  Completed, signed off  If discussed at Long Length of Stay Meetings, dates discussed:    Additional Comments:  Kermit BaloKelli F Cheskel Silverio, RN 12/30/2017, 12:13 PM

## 2017-12-30 NOTE — Discharge Instructions (Signed)
Diabetes Mellitus and Nutrition When you have diabetes (diabetes mellitus), it is very important to have healthy eating habits because your blood sugar (glucose) levels are greatly affected by what you eat and drink. Eating healthy foods in the appropriate amounts, at about the same times every day, can help you:  Control your blood glucose.  Lower your risk of heart disease.  Improve your blood pressure.  Reach or maintain a healthy weight.  Every person with diabetes is different, and each person has different needs for a meal plan. Your health care provider may recommend that you work with a diet and nutrition specialist (dietitian) to make a meal plan that is best for you. Your meal plan may vary depending on factors such as:  The calories you need.  The medicines you take.  Your weight.  Your blood glucose, blood pressure, and cholesterol levels.  Your activity level.  Other health conditions you have, such as heart or kidney disease.  How do carbohydrates affect me? Carbohydrates affect your blood glucose level more than any other type of food. Eating carbohydrates naturally increases the amount of glucose in your blood. Carbohydrate counting is a method for keeping track of how many carbohydrates you eat. Counting carbohydrates is important to keep your blood glucose at a healthy level, especially if you use insulin or take certain oral diabetes medicines. It is important to know how many carbohydrates you can safely have in each meal. This is different for every person. Your dietitian can help you calculate how many carbohydrates you should have at each meal and for snack. Foods that contain carbohydrates include:  Bread, cereal, rice, pasta, and crackers.  Potatoes and corn.  Peas, beans, and lentils.  Milk and yogurt.  Fruit and juice.  Desserts, such as cakes, cookies, ice cream, and candy.  How does alcohol affect me? Alcohol can cause a sudden decrease in blood  glucose (hypoglycemia), especially if you use insulin or take certain oral diabetes medicines. Hypoglycemia can be a life-threatening condition. Symptoms of hypoglycemia (sleepiness, dizziness, and confusion) are similar to symptoms of having too much alcohol. If your health care provider says that alcohol is safe for you, follow these guidelines:  Limit alcohol intake to no more than 1 drink per day for nonpregnant women and 2 drinks per day for men. One drink equals 12 oz of beer, 5 oz of wine, or 1 oz of hard liquor.  Do not drink on an empty stomach.  Keep yourself hydrated with water, diet soda, or unsweetened iced tea.  Keep in mind that regular soda, juice, and other mixers may contain a lot of sugar and must be counted as carbohydrates.  What are tips for following this plan? Reading food labels  Start by checking the serving size on the label. The amount of calories, carbohydrates, fats, and other nutrients listed on the label are based on one serving of the food. Many foods contain more than one serving per package.  Check the total grams (g) of carbohydrates in one serving. You can calculate the number of servings of carbohydrates in one serving by dividing the total carbohydrates by 15. For example, if a food has 30 g of total carbohydrates, it would be equal to 2 servings of carbohydrates.  Check the number of grams (g) of saturated and trans fats in one serving. Choose foods that have low or no amount of these fats.  Check the number of milligrams (mg) of sodium in one serving. Most people   should limit total sodium intake to less than 2,300 mg per day.  Always check the nutrition information of foods labeled as "low-fat" or "nonfat". These foods may be higher in added sugar or refined carbohydrates and should be avoided.  Talk to your dietitian to identify your daily goals for nutrients listed on the label. Shopping  Avoid buying canned, premade, or processed foods. These  foods tend to be high in fat, sodium, and added sugar.  Shop around the outside edge of the grocery store. This includes fresh fruits and vegetables, bulk grains, fresh meats, and fresh dairy. Cooking  Use low-heat cooking methods, such as baking, instead of high-heat cooking methods like deep frying.  Cook using healthy oils, such as olive, canola, or sunflower oil.  Avoid cooking with butter, cream, or high-fat meats. Meal planning  Eat meals and snacks regularly, preferably at the same times every day. Avoid going long periods of time without eating.  Eat foods high in fiber, such as fresh fruits, vegetables, beans, and whole grains. Talk to your dietitian about how many servings of carbohydrates you can eat at each meal.  Eat 4-6 ounces of lean protein each day, such as lean meat, chicken, fish, eggs, or tofu. 1 ounce is equal to 1 ounce of meat, chicken, or fish, 1 egg, or 1/4 cup of tofu.  Eat some foods each day that contain healthy fats, such as avocado, nuts, seeds, and fish. Lifestyle   Check your blood glucose regularly.  Exercise at least 30 minutes 5 or more days each week, or as told by your health care provider.  Take medicines as told by your health care provider.  Do not use any products that contain nicotine or tobacco, such as cigarettes and e-cigarettes. If you need help quitting, ask your health care provider.  Work with a counselor or diabetes educator to identify strategies to manage stress and any emotional and social challenges. What are some questions to ask my health care provider?  Do I need to meet with a diabetes educator?  Do I need to meet with a dietitian?  What number can I call if I have questions?  When are the best times to check my blood glucose? Where to find more information:  American Diabetes Association: diabetes.org/food-and-fitness/food  Academy of Nutrition and Dietetics:  www.eatright.org/resources/health/diseases-and-conditions/diabetes  National Institute of Diabetes and Digestive and Kidney Diseases (NIH): www.niddk.nih.gov/health-information/diabetes/overview/diet-eating-physical-activity Summary  A healthy meal plan will help you control your blood glucose and maintain a healthy lifestyle.  Working with a diet and nutrition specialist (dietitian) can help you make a meal plan that is best for you.  Keep in mind that carbohydrates and alcohol have immediate effects on your blood glucose levels. It is important to count carbohydrates and to use alcohol carefully. This information is not intended to replace advice given to you by your health care provider. Make sure you discuss any questions you have with your health care provider. Document Released: 12/29/2004 Document Revised: 05/08/2016 Document Reviewed: 05/08/2016 Elsevier Interactive Patient Education  2018 [employer]  

## 2017-12-31 LAB — AEROBIC/ANAEROBIC CULTURE (SURGICAL/DEEP WOUND): CULTURE: NO GROWTH

## 2017-12-31 LAB — GLUCOSE, CAPILLARY: Glucose-Capillary: 132 mg/dL — ABNORMAL HIGH (ref 70–99)

## 2017-12-31 LAB — AEROBIC/ANAEROBIC CULTURE W GRAM STAIN (SURGICAL/DEEP WOUND)

## 2018-10-22 ENCOUNTER — Emergency Department (HOSPITAL_COMMUNITY): Payer: BC Managed Care – PPO

## 2018-10-22 ENCOUNTER — Inpatient Hospital Stay (HOSPITAL_COMMUNITY)
Admission: EM | Admit: 2018-10-22 | Discharge: 2018-10-28 | DRG: 467 | Disposition: A | Discharge status: Home Health | Payer: BC Managed Care – PPO | Attending: Internal Medicine | Admitting: Internal Medicine

## 2018-10-22 ENCOUNTER — Encounter (HOSPITAL_COMMUNITY): Payer: Self-pay

## 2018-10-22 ENCOUNTER — Inpatient Hospital Stay (HOSPITAL_COMMUNITY): Payer: BC Managed Care – PPO

## 2018-10-22 ENCOUNTER — Other Ambulatory Visit: Payer: Self-pay

## 2018-10-22 DIAGNOSIS — W1849XA Other slipping, tripping and stumbling without falling, initial encounter: Secondary | ICD-10-CM | POA: Diagnosis present

## 2018-10-22 DIAGNOSIS — F329 Major depressive disorder, single episode, unspecified: Secondary | ICD-10-CM | POA: Diagnosis present

## 2018-10-22 DIAGNOSIS — D509 Iron deficiency anemia, unspecified: Secondary | ICD-10-CM | POA: Diagnosis present

## 2018-10-22 DIAGNOSIS — Z419 Encounter for procedure for purposes other than remedying health state, unspecified: Secondary | ICD-10-CM

## 2018-10-22 DIAGNOSIS — F411 Generalized anxiety disorder: Secondary | ICD-10-CM | POA: Diagnosis present

## 2018-10-22 DIAGNOSIS — E440 Moderate protein-calorie malnutrition: Secondary | ICD-10-CM | POA: Diagnosis present

## 2018-10-22 DIAGNOSIS — M978XXA Periprosthetic fracture around other internal prosthetic joint, initial encounter: Secondary | ICD-10-CM | POA: Diagnosis not present

## 2018-10-22 DIAGNOSIS — B9620 Unspecified Escherichia coli [E. coli] as the cause of diseases classified elsewhere: Secondary | ICD-10-CM | POA: Diagnosis present

## 2018-10-22 DIAGNOSIS — E119 Type 2 diabetes mellitus without complications: Secondary | ICD-10-CM | POA: Diagnosis present

## 2018-10-22 DIAGNOSIS — Y9301 Activity, walking, marching and hiking: Secondary | ICD-10-CM | POA: Diagnosis present

## 2018-10-22 DIAGNOSIS — F17210 Nicotine dependence, cigarettes, uncomplicated: Secondary | ICD-10-CM | POA: Diagnosis present

## 2018-10-22 DIAGNOSIS — M9702XA Periprosthetic fracture around internal prosthetic left hip joint, initial encounter: Secondary | ICD-10-CM | POA: Diagnosis present

## 2018-10-22 DIAGNOSIS — Z1159 Encounter for screening for other viral diseases: Secondary | ICD-10-CM

## 2018-10-22 DIAGNOSIS — Y92013 Bedroom of single-family (private) house as the place of occurrence of the external cause: Secondary | ICD-10-CM | POA: Diagnosis not present

## 2018-10-22 DIAGNOSIS — D508 Other iron deficiency anemias: Secondary | ICD-10-CM

## 2018-10-22 DIAGNOSIS — Z9081 Acquired absence of spleen: Secondary | ICD-10-CM | POA: Diagnosis not present

## 2018-10-22 DIAGNOSIS — J449 Chronic obstructive pulmonary disease, unspecified: Secondary | ICD-10-CM | POA: Diagnosis present

## 2018-10-22 DIAGNOSIS — D62 Acute posthemorrhagic anemia: Secondary | ICD-10-CM | POA: Diagnosis not present

## 2018-10-22 DIAGNOSIS — N390 Urinary tract infection, site not specified: Secondary | ICD-10-CM | POA: Diagnosis present

## 2018-10-22 DIAGNOSIS — Z79899 Other long term (current) drug therapy: Secondary | ICD-10-CM

## 2018-10-22 DIAGNOSIS — T1490XA Injury, unspecified, initial encounter: Secondary | ICD-10-CM

## 2018-10-22 DIAGNOSIS — S72352A Displaced comminuted fracture of shaft of left femur, initial encounter for closed fracture: Secondary | ICD-10-CM | POA: Diagnosis not present

## 2018-10-22 DIAGNOSIS — S72332A Displaced oblique fracture of shaft of left femur, initial encounter for closed fracture: Secondary | ICD-10-CM | POA: Diagnosis present

## 2018-10-22 DIAGNOSIS — E559 Vitamin D deficiency, unspecified: Secondary | ICD-10-CM | POA: Diagnosis present

## 2018-10-22 DIAGNOSIS — Z681 Body mass index (BMI) 19 or less, adult: Secondary | ICD-10-CM | POA: Diagnosis not present

## 2018-10-22 DIAGNOSIS — K219 Gastro-esophageal reflux disease without esophagitis: Secondary | ICD-10-CM | POA: Diagnosis present

## 2018-10-22 DIAGNOSIS — S72332K Displaced oblique fracture of shaft of left femur, subsequent encounter for closed fracture with nonunion: Secondary | ICD-10-CM | POA: Diagnosis present

## 2018-10-22 DIAGNOSIS — F32A Depression, unspecified: Secondary | ICD-10-CM | POA: Diagnosis present

## 2018-10-22 DIAGNOSIS — S72392A Other fracture of shaft of left femur, initial encounter for closed fracture: Secondary | ICD-10-CM

## 2018-10-22 DIAGNOSIS — Z96649 Presence of unspecified artificial hip joint: Secondary | ICD-10-CM

## 2018-10-22 DIAGNOSIS — S7290XA Unspecified fracture of unspecified femur, initial encounter for closed fracture: Secondary | ICD-10-CM

## 2018-10-22 DIAGNOSIS — M25552 Pain in left hip: Secondary | ICD-10-CM | POA: Diagnosis present

## 2018-10-22 LAB — TYPE AND SCREEN
ABO/RH(D): O POS
Antibody Screen: NEGATIVE

## 2018-10-22 LAB — SARS CORONAVIRUS 2 BY RT PCR (HOSPITAL ORDER, PERFORMED IN ~~LOC~~ HOSPITAL LAB): SARS Coronavirus 2: NEGATIVE

## 2018-10-22 LAB — CBC WITH DIFFERENTIAL/PLATELET
Abs Immature Granulocytes: 0.02 10*3/uL (ref 0.00–0.07)
Basophils Absolute: 0.1 10*3/uL (ref 0.0–0.1)
Basophils Relative: 1 %
Eosinophils Absolute: 0 10*3/uL (ref 0.0–0.5)
Eosinophils Relative: 0 %
HCT: 38.8 % (ref 36.0–46.0)
Hemoglobin: 12.5 g/dL (ref 12.0–15.0)
Immature Granulocytes: 0 %
Lymphocytes Relative: 26 %
Lymphs Abs: 3 10*3/uL (ref 0.7–4.0)
MCH: 26.8 pg (ref 26.0–34.0)
MCHC: 32.2 g/dL (ref 30.0–36.0)
MCV: 83.3 fL (ref 80.0–100.0)
Monocytes Absolute: 0.9 10*3/uL (ref 0.1–1.0)
Monocytes Relative: 8 %
Neutro Abs: 7.7 10*3/uL (ref 1.7–7.7)
Neutrophils Relative %: 65 %
Platelets: 556 10*3/uL — ABNORMAL HIGH (ref 150–400)
RBC: 4.66 MIL/uL (ref 3.87–5.11)
RDW: 17.1 % — ABNORMAL HIGH (ref 11.5–15.5)
WBC: 11.7 10*3/uL — ABNORMAL HIGH (ref 4.0–10.5)
nRBC: 0 % (ref 0.0–0.2)

## 2018-10-22 LAB — TSH: TSH: 0.072 u[IU]/mL — ABNORMAL LOW (ref 0.350–4.500)

## 2018-10-22 LAB — BASIC METABOLIC PANEL
Anion gap: 10 (ref 5–15)
BUN: 17 mg/dL (ref 8–23)
CO2: 23 mmol/L (ref 22–32)
Calcium: 9.3 mg/dL (ref 8.9–10.3)
Chloride: 103 mmol/L (ref 98–111)
Creatinine, Ser: 0.56 mg/dL (ref 0.44–1.00)
GFR calc Af Amer: >60 mL/min (ref >60)
GFR calc non Af Amer: >60 mL/min (ref >60)
Glucose, Bld: 101 mg/dL — ABNORMAL HIGH (ref 70–99)
Potassium: 4.4 mmol/L (ref 3.5–5.1)
Sodium: 136 mmol/L (ref 135–145)

## 2018-10-22 LAB — PHOSPHORUS: Phosphorus: 3.9 mg/dL (ref 2.5–4.6)

## 2018-10-22 LAB — ALBUMIN: Albumin: 3.4 g/dL — ABNORMAL LOW (ref 3.5–5.0)

## 2018-10-22 LAB — SEDIMENTATION RATE: Sed Rate: 45 mm/hr — ABNORMAL HIGH (ref 0–22)

## 2018-10-22 LAB — C-REACTIVE PROTEIN: CRP: 2.9 mg/dL — ABNORMAL HIGH (ref <1.0)

## 2018-10-22 LAB — PREALBUMIN: Prealbumin: 23.8 mg/dL (ref 18–38)

## 2018-10-22 LAB — PROTIME-INR
INR: 1.1 (ref 0.8–1.2)
Prothrombin Time: 13.8 seconds (ref 11.4–15.2)

## 2018-10-22 LAB — MAGNESIUM: Magnesium: 2.1 mg/dL (ref 1.7–2.4)

## 2018-10-22 MED ORDER — CALCIUM CITRATE 950 (200 CA) MG PO TABS
200.0000 mg | ORAL_TABLET | Freq: Two times a day (BID) | ORAL | Status: DC
  Administered 2018-10-23 – 2018-10-28 (×10): 200 mg via ORAL
  Filled 2018-10-22 (×13): qty 1

## 2018-10-22 MED ORDER — OXYMETAZOLINE HCL 0.05 % NA SOLN
1.0000 | NASAL | Status: DC | PRN
  Filled 2018-10-22: qty 30

## 2018-10-22 MED ORDER — PANTOPRAZOLE SODIUM 40 MG PO TBEC
40.0000 mg | DELAYED_RELEASE_TABLET | Freq: Every day | ORAL | Status: DC
  Administered 2018-10-23 – 2018-10-28 (×5): 40 mg via ORAL
  Filled 2018-10-22 (×5): qty 1

## 2018-10-22 MED ORDER — ENOXAPARIN SODIUM 40 MG/0.4ML ~~LOC~~ SOLN
40.0000 mg | SUBCUTANEOUS | Status: DC
  Administered 2018-10-25 – 2018-10-27 (×3): 40 mg via SUBCUTANEOUS
  Filled 2018-10-22 (×3): qty 0.4

## 2018-10-22 MED ORDER — INSULIN ASPART 100 UNIT/ML ~~LOC~~ SOLN
0.0000 [IU] | Freq: Three times a day (TID) | SUBCUTANEOUS | Status: DC
  Administered 2018-10-24: 17:00:00 1 [IU] via SUBCUTANEOUS
  Filled 2018-10-22: qty 0.09

## 2018-10-22 MED ORDER — POLYETHYLENE GLYCOL 3350 17 G PO PACK: 17.0000 g | PACK | Freq: Every day | ORAL | Status: DC | PRN

## 2018-10-22 MED ORDER — CALCIUM CARBONATE-VITAMIN D 500-200 MG-UNIT PO TABS
1.0000 | ORAL_TABLET | Freq: Every day | ORAL | Status: DC
  Administered 2018-10-23 – 2018-10-28 (×5): 1 via ORAL
  Filled 2018-10-22 (×6): qty 1

## 2018-10-22 MED ORDER — FERROUS SULFATE 325 (65 FE) MG PO TABS
325.0000 mg | ORAL_TABLET | Freq: Two times a day (BID) | ORAL | Status: DC
  Administered 2018-10-23 – 2018-10-28 (×9): 325 mg via ORAL
  Filled 2018-10-22 (×11): qty 1

## 2018-10-22 MED ORDER — HYDROCODONE-ACETAMINOPHEN 5-325 MG PO TABS
1.0000 | ORAL_TABLET | Freq: Four times a day (QID) | ORAL | Status: DC | PRN
  Administered 2018-10-22 – 2018-10-24 (×4): 1 via ORAL
  Administered 2018-10-24 – 2018-10-25 (×2): 2 via ORAL
  Filled 2018-10-22: qty 1
  Filled 2018-10-22 (×3): qty 2
  Filled 2018-10-22: qty 1
  Filled 2018-10-22: qty 2

## 2018-10-22 MED ORDER — ALPRAZOLAM 0.5 MG PO TABS
1.0000 mg | ORAL_TABLET | Freq: Three times a day (TID) | ORAL | Status: DC
  Administered 2018-10-22 – 2018-10-28 (×16): 1 mg via ORAL
  Filled 2018-10-22 (×16): qty 2

## 2018-10-22 MED ORDER — FENTANYL CITRATE (PF) 100 MCG/2ML IJ SOLN
25.0000 ug | INTRAMUSCULAR | Status: DC | PRN
  Administered 2018-10-22 – 2018-10-26 (×9): 25 ug via INTRAVENOUS
  Filled 2018-10-22 (×9): qty 2

## 2018-10-22 MED ORDER — INSULIN ASPART 100 UNIT/ML ~~LOC~~ SOLN
0.0000 [IU] | Freq: Every day | SUBCUTANEOUS | Status: DC
  Filled 2018-10-22: qty 0.05

## 2018-10-22 MED ORDER — SODIUM CHLORIDE 0.9 % IV SOLN
INTRAVENOUS | Status: AC
  Administered 2018-10-22 – 2018-10-23 (×2): via INTRAVENOUS

## 2018-10-22 MED ORDER — ADULT MULTIVITAMIN W/MINERALS CH
1.0000 | ORAL_TABLET | Freq: Every day | ORAL | Status: DC
  Administered 2018-10-23 – 2018-10-28 (×5): 1 via ORAL
  Filled 2018-10-22 (×5): qty 1

## 2018-10-22 MED ORDER — METHOCARBAMOL 500 MG PO TABS
500.0000 mg | ORAL_TABLET | Freq: Three times a day (TID) | ORAL | Status: DC | PRN
  Administered 2018-10-22 – 2018-10-23 (×2): 500 mg via ORAL
  Filled 2018-10-22 (×2): qty 1

## 2018-10-22 MED ORDER — FENTANYL CITRATE (PF) 100 MCG/2ML IJ SOLN
50.0000 ug | Freq: Once | INTRAMUSCULAR | Status: AC
  Administered 2018-10-22: 50 ug via INTRAVENOUS
  Filled 2018-10-22: qty 2

## 2018-10-22 MED ORDER — DOCUSATE SODIUM 100 MG PO CAPS
100.0000 mg | ORAL_CAPSULE | Freq: Two times a day (BID) | ORAL | Status: DC
  Administered 2018-10-23 – 2018-10-28 (×10): 100 mg via ORAL
  Filled 2018-10-22 (×10): qty 1

## 2018-10-22 MED ORDER — ALBUTEROL SULFATE (2.5 MG/3ML) 0.083% IN NEBU: 2.5000 mg | INHALATION_SOLUTION | Freq: Four times a day (QID) | RESPIRATORY_TRACT | Status: DC | PRN

## 2018-10-22 MED ORDER — MIRTAZAPINE 15 MG PO TABS
30.0000 mg | ORAL_TABLET | Freq: Every day | ORAL | Status: DC
  Administered 2018-10-23 – 2018-10-27 (×5): 30 mg via ORAL
  Filled 2018-10-22 (×5): qty 2

## 2018-10-22 MED ORDER — VITAMIN C 500 MG PO TABS
500.0000 mg | ORAL_TABLET | Freq: Every day | ORAL | Status: DC
  Administered 2018-10-23 – 2018-10-28 (×5): 500 mg via ORAL
  Filled 2018-10-22 (×5): qty 1

## 2018-10-22 MED ORDER — BISACODYL 10 MG RE SUPP: 10.0000 mg | Freq: Every day | RECTAL | Status: DC | PRN

## 2018-10-22 MED ORDER — VITAMIN D 25 MCG (1000 UNIT) PO TABS
2000.0000 [IU] | ORAL_TABLET | Freq: Two times a day (BID) | ORAL | Status: DC
  Administered 2018-10-23 – 2018-10-28 (×10): 2000 [IU] via ORAL
  Filled 2018-10-22 (×10): qty 2

## 2018-10-22 NOTE — Consult Note (Signed)
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 Patient hasnt followed up with me since December. 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 Bone health labs ordered. NPO after midnight, tentatively will post for tomorrow for repair. Patient is smoker and newly diagnosed diabetic in fall. Patient has severely poor healing potential. Will see in AM.  Shona Needles, MD [facility] [phone] (phone) [phone] (office) [email or web address]

## 2018-10-22 NOTE — H&P (Signed)
History and Physical    Melanie Parsons ZOX:[id number] DOB: Nov 21, 1955 DOA: 10/22/2018  PCP: [name], [name], MD   Patient coming from: Home  Chief Complaint: Leg Pain  HPI: Melanie BlossomSandra Kay Rush Parsons is a 63 y.o. female with medical history significant for but not limited to depression and anxiety, GERD, tobacco abuse, arthritis, history of migraines, history of urinary tract infections, history of left femur and hip fracture and other comorbidities who presents with a chief complaint of left leg pain.  Patient states that she was ambulating yesterday from her room to the bathroom and felt a sudden onset of sharp pain.  States that she thought she was going to fall so she grabbed onto the wall and asked her daughter to bring in a rolling chair and set on rolling chair.  Patient got up a few times and transfer to a bedside commode since then but today was unable to ambulate and put pressure on her leg.  She also notes that her leg started swelling around the hip area which was new since overnight.  She denies any chest pain, lightheadedness or dizziness.  No nausea or vomiting.  Still smokes sometimes.  No other concerns or plans at this time and [facility] was asked to admit this patient for a left leg periprosthetic femur fracture that was recurrent and Orthopedic Surgery requested the patient be sent to [facility] for further evaluation.  ED Course: In the ED the patient had basic blood work done as well as imaging of her hip and femur.  She was also given pain control  Review of Systems: As per HPI otherwise all other systems reviewed and negative.   Past Medical History:  Diagnosis Date  . Anxiety   . Arthritis    "joints" (12/25/2017)  . GERD (gastroesophageal reflux disease)   . History of blood transfusion    "related RX I took; made my have my period q 2 wks"  . Migraine    "had one 3 days last week" (12/25/2017)  . Pneumonia    "walking pneumonia one times" (12/25/2017)  . Urinary  tract infection 12/27/2017  . Wears glasses   . Wears partial dentures    Past Surgical History:  Procedure Laterality Date  . APPENDECTOMY  1990  . FEMUR FRACTURE SURGERY Left   . FRACTURE SURGERY    . HARDWARE REMOVAL Left 12/26/2017   Procedure: HARDWARE REMOVAL;  Surgeon: Roby LoftsHaddix, Kevin P, MD;  Location: [facility] OR;  Service: Orthopedics;  Laterality: Left;  removal of plate, screws and cables.   . JOINT REPLACEMENT    . MULTIPLE TOOTH EXTRACTIONS    . ORIF PERIPROSTHETIC FRACTURE Left 12/26/2017   Procedure: OPEN REDUCTION INTERNAL FIXATION (ORIF) PERIPROSTHETIC FRACTURE;  Surgeon: Roby LoftsHaddix, Kevin P, MD;  Location: [facility] OR;  Service: Orthopedics;  Laterality: Left;  . SPLENECTOMY, TOTAL  1990  . TOTAL HIP ARTHROPLASTY Left 2016  . WRIST FRACTURE SURGERY Right    "put in titanium rod"   SOCIAL HISTORY  reports that she has been smoking cigarettes. She has a 15.00 pack-year smoking history. She has never used smokeless tobacco. She reports previous alcohol use. She reports that she does not use drugs.  Allergies  Allergen Reactions  . Penicillins Rash    Rash and itching   FAMILY HISTORY History reviewed. No pertinent family history to presenting complaint  Prior to Admission medications   Medication Sig Start Date End Date Taking? Authorizing Provider  acetaminophen (TYLENOL) 500 MG tablet Take  1,000 mg by mouth as needed for mild pain.   Yes [provider]  ALPRAZolam Prudy Feeler(XANAX) 1 MG tablet Take 1 mg by mouth 3 (three) times daily.   Yes [provider]  calcium-vitamin D (OSCAL WITH D) 500-200 MG-UNIT tablet Take 1 tablet by mouth daily.   Yes [provider]  Cyanocobalamin (VITAMIN B-12 PO) Take 1 tablet by mouth daily.   Yes [provider]  mirtazapine (REMERON) 30 MG tablet Take 30 mg by mouth at bedtime.   Yes [provider]  Multiple Vitamins-Minerals (MULTIVITAMIN ADULT PO) Take 1 tablet by mouth daily.   Yes [provider]   omeprazole (PRILOSEC) 20 MG capsule Take 20 mg by mouth daily.   Yes [provider]  oxymetazoline (AFRIN NODRIP SEVERE CONGEST) 0.05 % nasal spray Place 1 spray into both nostrils as needed for congestion.   Yes [provider]  calcium citrate (CALCITRATE - DOSED IN MG ELEMENTAL CALCIUM) 950 MG tablet Take 1 tablet (200 mg of elemental calcium total) by mouth 2 (two) times daily. Patient not taking: Reported on 10/22/2018 12/30/17   Dhungel, Theda BelfastNishant, MD  cholecalciferol 2000 units TABS Take 1 tablet (2,000 Units total) by mouth 2 (two) times daily. Patient not taking: Reported on 10/22/2018 12/30/17   Dhungel, Theda BelfastNishant, MD  enoxaparin (LOVENOX) 40 MG/0.4ML injection Inject 0.4 mLs (40 mg total) into the skin daily. 12/30/17 01/29/18  Dhungel, Theda BelfastNishant, MD  ferrous sulfate 325 (65 FE) MG tablet Take 1 tablet (325 mg total) by mouth 2 (two) times daily with a meal. Patient not taking: Reported on 10/22/2018 12/30/17   Dhungel, Theda BelfastNishant, MD  HYDROcodone-acetaminophen (NORCO/VICODIN) 5-325 MG tablet Take 2 tablets by mouth every 6 (six) hours as needed for moderate pain. Patient not taking: Reported on 10/22/2018 12/30/17   Dhungel, Theda BelfastNishant, MD  lactose free nutrition (BOOST PLUS) LIQD Take 237 mLs by mouth 3 (three) times daily with meals. Patient not taking: Reported on 10/22/2018 12/30/17   Dhungel, Theda BelfastNishant, MD  methocarbamol (ROBAXIN) 500 MG tablet Take 1 tablet (500 mg total) by mouth every 8 (eight) hours as needed for muscle spasms. Patient not taking: Reported on 10/22/2018 12/30/17   Dhungel, Theda BelfastNishant, MD  Multiple Vitamin (MULTIVITAMIN WITH MINERALS) TABS tablet Take 1 tablet by mouth daily. Patient not taking: Reported on 10/22/2018 12/31/17   Dhungel, Theda BelfastNishant, MD  polyethylene glycol (MIRALAX) packet Take 17 g by mouth daily as needed for mild constipation. Patient not taking: Reported on 10/22/2018 12/30/17   Dhungel, Theda BelfastNishant, MD  vitamin C (VITAMIN C) 500 MG tablet Take 1 tablet (500 mg total)  by mouth daily. Patient not taking: Reported on 10/22/2018 12/31/17   Eddie Northhungel, Nishant, MD   Physical Exam: Vitals:   10/22/18 1254 10/22/18 1400 10/22/18 1430  BP: 113/81    Pulse: 90 98 (!) 103  Resp: 20 (!) 21 (!) 27  Temp: 97.8 F (36.6 C)    SpO2: 98% 98% 99%  Weight: 47.6 kg    Height: 5\' 10"  (1.778 m)     Constitutional: Caucasian female in NAD and appears calm but slightly uncomfortable Eyes: Lids and conjunctivae normal, sclerae anicteric  ENMT: External Ears, Nose appear normal. Grossly normal hearing. Mucous membranes are moist but has a poor dentition.  Neck: Appears normal, supple, no cervical masses, normal ROM, no appreciable thyromegaly; no JVD Respiratory: Diminished to auscultation bilaterally, no wheezing, rales, rhonchi or crackles. Normal respiratory effort and patient is not tachypenic. No accessory muscle use. Unlabored breathing  Cardiovascular: RRR, no murmurs / rubs / gallops. S1 and S2 auscultated. Had some Left Hip Edema  Abdomen: Soft, non-tender, mildly diistended. No masses palpated. No appreciable hepatosplenomegaly. Bowel sounds positive x4.  GU: Deferred. Musculoskeletal: Left Leg shorter and medially rotated Skin: No rashes, lesions, ulcers on a limited skin evaluation. No induration; Warm and dry.  Neurologic: CN 2-12 grossly intact with no focal deficits. Romberg sign and cerebellar reflexes not assessed.  Psychiatric: Normal judgment and insight. Alert and oriented x 3. Mildly anxious mood and appropriate affect.   Labs on Admission: I have personally reviewed following labs and imaging studies  CBC: Recent Labs  Lab 10/22/18 1415  WBC 11.7*  NEUTROABS 7.7  HGB 12.5  HCT 38.8  MCV 83.3  PLT 556*   Basic Metabolic Panel: Recent Labs  Lab 10/22/18 1415  NA 136  K 4.4  CL 103  CO2 23  GLUCOSE 101*  BUN 17  CREATININE 0.56  CALCIUM 9.3   GFR: Estimated Creatinine Clearance: 54.1 mL/min (by C-G formula based on SCr of 0.56 mg/dL).  Liver Function Tests: No results for input(s): AST, ALT, ALKPHOS, BILITOT, PROT, ALBUMIN in the last 168 hours. No results for input(s): LIPASE, AMYLASE in the last 168 hours. No results for input(s): AMMONIA in the last 168 hours. Coagulation Profile: No results for input(s): INR, PROTIME in the last 168 hours. Cardiac Enzymes: No results for input(s): CKTOTAL, CKMB, CKMBINDEX, TROPONINI in the last 168 hours. BNP (last 3 results) No results for input(s): PROBNP in the last 8760 hours. HbA1C: No results for input(s): HGBA1C in the last 72 hours. CBG: No results for input(s): GLUCAP in the last 168 hours. Lipid Profile: No results for input(s): CHOL, HDL, LDLCALC, TRIG, CHOLHDL, LDLDIRECT in the last 72 hours. Thyroid Function Tests: No results for input(s): TSH, T4TOTAL, FREET4, T3FREE, THYROIDAB in the last 72 hours. Anemia Panel: No results for input(s): VITAMINB12, FOLATE, FERRITIN, TIBC, IRON, RETICCTPCT in the last 72 hours. Urine analysis:    Component Value Date/Time   COLORURINE YELLOW 12/26/2017 0429   APPEARANCEUR HAZY (A) 12/26/2017 0429   LABSPEC 1.016 12/26/2017 0429   PHURINE 5.0 12/26/2017 0429   GLUCOSEU NEGATIVE 12/26/2017 0429   HGBUR SMALL (A) 12/26/2017 0429   BILIRUBINUR NEGATIVE 12/26/2017 0429   KETONESUR 5 (A) 12/26/2017 0429   PROTEINUR NEGATIVE 12/26/2017 0429   NITRITE POSITIVE (A) 12/26/2017 0429   LEUKOCYTESUR LARGE (A) 12/26/2017 0429   Sepsis Labs: !!!!!!!!!!!!!!!!!!!!!!!!!!!!!!!!!!!!!!!!!!!! @LABRCNTIP (procalcitonin:4,lacticidven:4) ) Recent Results (from the past 240 hour(s))  SARS Coronavirus 2 (CEPHEID - Performed in [facility] hospital lab), Hosp Order     Status: None   Collection Time: 10/22/18  2:21 PM   Specimen: Nasopharyngeal Swab  Result Value Ref Range Status   SARS Coronavirus 2 NEGATIVE NEGATIVE Final    Comment: (NOTE) If result is NEGATIVE SARS-CoV-2 target nucleic acids are NOT DETECTED. The SARS-CoV-2 RNA is generally  detectable in upper and lower  respiratory specimens during the acute phase of infection. The lowest  concentration of SARS-CoV-2 viral copies this assay can detect is 250  copies / mL. A negative result does not preclude SARS-CoV-2 infection  and should not be used as the sole basis for treatment or other  patient management decisions.  A negative result may occur with  improper specimen collection / handling, submission of specimen other  than nasopharyngeal swab, presence of viral mutation(s) within the  areas targeted by this assay, and inadequate number of viral copies  (<250  copies / mL). A negative result must be combined with clinical  observations, patient history, and epidemiological information. If result is POSITIVE SARS-CoV-2 target nucleic acids are DETECTED. The SARS-CoV-2 RNA is generally detectable in upper and lower  respiratory specimens dur ing the acute phase of infection.  Positive  results are indicative of active infection with SARS-CoV-2.  Clinical  correlation with patient history and other diagnostic information is  necessary to determine patient infection status.  Positive results do  not rule out bacterial infection or co-infection with other viruses. If result is PRESUMPTIVE POSTIVE SARS-CoV-2 nucleic acids MAY BE PRESENT.   A presumptive positive result was obtained on the submitted specimen  and confirmed on repeat testing.  While 2019 novel coronavirus  (SARS-CoV-2) nucleic acids may be present in the submitted sample  additional confirmatory testing may be necessary for epidemiological  and / or clinical management purposes  to differentiate between  SARS-CoV-2 and other Sarbecovirus currently known to infect humans.  If clinically indicated additional testing with an alternate test  methodology [phone]) is advised. The SARS-CoV-2 RNA is generally  detectable in upper and lower respiratory sp ecimens during the acute  phase of infection. The  expected result is Negative. Fact Sheet for Patients:  BoilerBrush.com.cy Fact Sheet for Healthcare Providers: https://pope.com/ This test is not yet approved or cleared by the [place] FDA and has been authorized for detection and/or diagnosis of SARS-CoV-2 by FDA under an Emergency Use Authorization (EUA).  This EUA will remain in effect (meaning this test can be used) for the duration of the COVID-19 declaration under Section 564(b)(1) of the Act, 21 U.S.C. section 360bbb-3(b)(1), unless the authorization is terminated or revoked sooner. Performed at [facility], [address] [place], [place] [address]     Radiological Exams on Admission: Dg Hip Unilat W Or Wo Pelvis 2-3 Views Left  Result Date: 10/22/2018 CLINICAL DATA:  Pain following walking with obvious femoral deformity, initial encounter EXAM: DG HIP (WITH OR WITHOUT PELVIS) 2-3V LEFT COMPARISON:  12/26/2017 FINDINGS: There is recurrent fracture in the area of previous midshaft femoral fracture with a fracture line through the previously placed fixation side plate. The hip prosthesis appears intact. Medial angulation of the distal fracture fragment is noted. The femoral component is not dislocated. The visualized pelvis appears within normal limits. IMPRESSION: Recurrent fracture in a similar location to that seen on the prior exam with subsequent fracture of the fixation sideplate. Electronically Signed   By: Alcide Clever M.D.   On: 10/22/2018 15:13   Dg Femur Min 2 Views Left  Result Date: 10/22/2018 CLINICAL DATA:  Pain while walking with obvious deformity EXAM: LEFT FEMUR 2 VIEWS COMPARISON:  12/26/2017 FINDINGS: There is a recurrent fracture in the midshaft of the left femur adjacent to the tip of the hip prosthesis. The previously placed fixation sideplate demonstrates fracture at this level. Approximately 1/2 bone width displacement is noted as well.  IMPRESSION: Recurrent fracture in the midshaft of the left femur in the area of prior fracture with additional fracture of the fixation side plate. Electronically Signed   By: Alcide Clever M.D.   On: 10/22/2018 15:14   EKG: Independently reviewed. Showed Sinus Tachycardia at a rate of 103 and a QTc of 439. No evidence of ST Elevation or Depression on my interpretation   Assessment/Plan Active Problems:   COPD (chronic obstructive pulmonary disease) (HCC)   Generalized anxiety disorder   Depression   New onset type 2 diabetes  mellitus ([place])   Iron deficiency anemia   Peri-prosthetic subtrochanteric femur fracture   Femur fracture (HCC)  Recurrent Femur Fracture -Admit to Inpatient Med-Surge at [facility] at the request of Orthopedic Surgeon -Hip and Femur X-Ray showed "There is recurrent fracture in the area of previous midshaft femoral fracture with a fracture line through the previously placed fixation side plate. The hip prosthesis appears intact. Medial angulation of the distal fracture fragment is noted. The femoral component is not dislocated. The visualized pelvis appears within normal limits. " -EDP discussed with Orthopedics Dr. Doreatha Martin who originally did the first surgery and he wants the patient over at [facility] for Further evaluation and recommendations  -Check Vitamin D Level  -C/w Vitamin D and Calcium Supplementation  -Pain Control with Acetaminophen, Norco, and IV Fentanyl -Bowel Regimen in Place.   GERD -C/w Omeprazole 20 mg po Daily Substitution with Pantoprazole     Arthritis   Iron Deficiency Anemia -Patient's Hb/Hct was 12.5/38.8 on Admission -C/w Ferrous Sulfate 325 mg po BID -Continue to Monitor for S/Sx of Bleeding; Currently no overt bleeding noted -Repeat CBC in AM   Diabetes Mellitus Type 2 -Diagnosed in 2019 -Place on Sensitive Novolog SSI AC/HS -Check HbA1c -Continue to Monitor Blood Sugars Carefully  COPD -Will add prn Breathing treatments with  Albuterol 2.5 mg Neb q2prn  Depression and Anxiety  -C/w Alprazolam 1 mg pot TID -C/w Mirtazapine 30 mg po qHS  DVT prophylaxis: Enoxaparin 40 mg sq q24h Code Status: FULL CODE  Family Communication: No family present at bedside Disposition Plan: Pending PT/OT Evaluation after Surgical Intervention  Consults called: Orthopedic Surgery Dr. Doreatha Martin  Admission status: Transfer to [facility] at the Request of Orthopedic Surgery   Severity of Illness: The appropriate patient status for this patient is INPATIENT. Inpatient status is judged to be reasonable and necessary in order to provide the required intensity of service to ensure the patient's safety. The patient's presenting symptoms, physical exam findings, and initial radiographic and laboratory data in the context of their chronic comorbidities is felt to place them at high risk for further clinical deterioration. Furthermore, it is not anticipated that the patient will be medically stable for discharge from the hospital within 2 midnights of admission. The following factors support the patient status of inpatient.   " The patient's presenting symptoms include Leg Pain  " The worrisome physical exam findings include Shortened Left Leg that is medially rotated and swollen on the hip . " The initial radiographic and laboratory data are worrisome because of ere is recurrent fracture in the area of previous midshaft femoral fracture with a fracture line through the previously placed fixation side plate. " The chronic co-morbidities are listed as above  * I certify that at the point of admission it is my clinical judgment that the patient will require inpatient hospital care spanning beyond 2 midnights from the point of admission due to high intensity of service, high risk for further deterioration and high frequency of surveillance required.Kerney Elbe, D.O. [facility] PAGER is on [place]  If 7PM-7AM, please contact  night-coverage [email or web address] Password [facility]  10/22/2018, 5:36 PM

## 2018-10-22 NOTE — ED Notes (Signed)
Bed: WA08 Expected date:  Expected time:  Means of arrival:  Comments: EMS 62yo leg pain, femur fx hx repair

## 2018-10-22 NOTE — ED Provider Notes (Signed)
[facility]-EMERGENCY DEPT Provider Note   CSN: [id number] Arrival date & time: 10/22/18  1244    History   Chief Complaint Chief Complaint  Patient presents with  . Hip Injury    HPI Melanie Parsons is a 63 y.o. female.     The history is provided by the patient and medical records. No language interpreter was used.   Melanie Parsons is a 63 y.o. female who presents to the Emergency Department complaining of hip injury.  She was walking down the hallway at her house yesterday and she felt a pain in her leg/thigh.  She was assisted to a chair.  She attempted to put weight on it a few times but was unable to bear weight.  She has a hx/o hip replacement in 2016 followed by femur fracture in 2018.  In September 2019 with another hip/femur revision.  She is seen by Dr. Jena GaussHaddix with Orthopedics.  Denies any recent illnesses.  No COVID 19 exposures. Sxs are severe, constant, worsening. She denies any additional symptoms, did not fall to the ground or hit her head. She states that her leg appears more swollen today than yesterday.  She has a hx/o diet controlled diabetes, COPD, asplenia.   Past Medical History:  Diagnosis Date  . Anxiety   . Arthritis    "joints" (12/25/2017)  . GERD (gastroesophageal reflux disease)   . History of blood transfusion    "related RX I took; made my have my period q 2 wks"  . Migraine    "had one 3 days last week" (12/25/2017)  . Pneumonia    "walking pneumonia one times" (12/25/2017)  . Urinary tract infection 12/27/2017  . Wears glasses   . Wears partial dentures     Patient Active Problem List   Diagnosis Date Noted  . Peri-prosthetic subtrochanteric femur fracture 10/22/2018  . Femur fracture (HCC) 10/22/2018  . New onset type 2 diabetes mellitus (HCC) 12/30/2017  . Hypotension 12/30/2017  . E-coli UTI 12/30/2017  . Iron deficiency anemia 12/30/2017  . Acute blood loss as cause of postoperative anemia 12/30/2017   . Acute urinary retention   . Malnutrition of moderate degree 12/28/2017  . COPD (chronic obstructive pulmonary disease) (HCC) 12/25/2017  . Generalized anxiety disorder 12/25/2017  . Depression 12/25/2017  . 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 Left displaced femoral neck fracture (HCC) 12/25/2017    Past Surgical History:  Procedure Laterality Date  . APPENDECTOMY  1990  . FEMUR FRACTURE SURGERY Left   . FRACTURE SURGERY    . HARDWARE REMOVAL Left 12/26/2017   Procedure: HARDWARE REMOVAL;  Surgeon: Roby LoftsHaddix, Kevin P, MD;  Location: [facility] OR;  Service: Orthopedics;  Laterality: Left;  removal of plate, screws and cables.   . JOINT REPLACEMENT    . MULTIPLE TOOTH EXTRACTIONS    . ORIF PERIPROSTHETIC FRACTURE Left 12/26/2017   Procedure: OPEN REDUCTION INTERNAL FIXATION (ORIF) PERIPROSTHETIC FRACTURE;  Surgeon: Roby LoftsHaddix, Kevin P, MD;  Location: [facility] OR;  Service: Orthopedics;  Laterality: Left;  . SPLENECTOMY, TOTAL  1990  . TOTAL HIP ARTHROPLASTY Left 2016  . WRIST FRACTURE SURGERY Right    "put in titanium rod"     OB History   No obstetric history on file.      Home Medications    Prior to Admission medications   Medication Sig Start Date End Date Taking? Authorizing Provider  acetaminophen (TYLENOL) 500 MG tablet  Take 1,000 mg by mouth as needed for mild pain.   Yes [provider]  ALPRAZolam Prudy Feeler(XANAX) 1 MG tablet Take 1 mg by mouth 3 (three) times daily.   Yes [provider]  calcium-vitamin D (OSCAL WITH D) 500-200 MG-UNIT tablet Take 1 tablet by mouth daily.   Yes [provider]  Cyanocobalamin (VITAMIN B-12 PO) Take 1 tablet by mouth daily.   Yes [provider]  mirtazapine (REMERON) 30 MG tablet Take 30 mg by mouth at bedtime.   Yes [provider]  Multiple Vitamins-Minerals (MULTIVITAMIN ADULT PO) Take 1 tablet by mouth daily.   Yes [provider]  omeprazole (PRILOSEC)  20 MG capsule Take 20 mg by mouth daily.   Yes [provider]  oxymetazoline (AFRIN NODRIP SEVERE CONGEST) 0.05 % nasal spray Place 1 spray into both nostrils as needed for congestion.   Yes [provider]  calcium citrate (CALCITRATE - DOSED IN MG ELEMENTAL CALCIUM) 950 MG tablet Take 1 tablet (200 mg of elemental calcium total) by mouth 2 (two) times daily. Patient not taking: Reported on 10/22/2018 12/30/17   Dhungel, Theda BelfastNishant, MD  cholecalciferol 2000 units TABS Take 1 tablet (2,000 Units total) by mouth 2 (two) times daily. Patient not taking: Reported on 10/22/2018 12/30/17   Dhungel, Theda BelfastNishant, MD  enoxaparin (LOVENOX) 40 MG/0.4ML injection Inject 0.4 mLs (40 mg total) into the skin daily. 12/30/17 01/29/18  Dhungel, Theda BelfastNishant, MD  ferrous sulfate 325 (65 FE) MG tablet Take 1 tablet (325 mg total) by mouth 2 (two) times daily with a meal. Patient not taking: Reported on 10/22/2018 12/30/17   Dhungel, Theda BelfastNishant, MD  HYDROcodone-acetaminophen (NORCO/VICODIN) 5-325 MG tablet Take 2 tablets by mouth every 6 (six) hours as needed for moderate pain. Patient not taking: Reported on 10/22/2018 12/30/17   Dhungel, Theda BelfastNishant, MD  lactose free nutrition (BOOST PLUS) LIQD Take 237 mLs by mouth 3 (three) times daily with meals. Patient not taking: Reported on 10/22/2018 12/30/17   Dhungel, Theda BelfastNishant, MD  methocarbamol (ROBAXIN) 500 MG tablet Take 1 tablet (500 mg total) by mouth every 8 (eight) hours as needed for muscle spasms. Patient not taking: Reported on 10/22/2018 12/30/17   Dhungel, Theda BelfastNishant, MD  Multiple Vitamin (MULTIVITAMIN WITH MINERALS) TABS tablet Take 1 tablet by mouth daily. Patient not taking: Reported on 10/22/2018 12/31/17   Dhungel, Theda BelfastNishant, MD  polyethylene glycol (MIRALAX) packet Take 17 g by mouth daily as needed for mild constipation. Patient not taking: Reported on 10/22/2018 12/30/17   Dhungel, Theda BelfastNishant, MD  vitamin C (VITAMIN C) 500 MG tablet Take 1 tablet (500 mg total) by mouth daily.  Patient not taking: Reported on 10/22/2018 12/31/17   Eddie Northhungel, Nishant, MD    Family History History reviewed. No pertinent family history.  Social History Social History   Tobacco Use  . Smoking status: Current Every Day Smoker    Packs/day: 0.50    Years: 30.00    Pack years: 15.00    Types: Cigarettes  . Smokeless tobacco: Never Used  Substance Use Topics  . Alcohol use: Not Currently    Comment: 12/25/2017 "nothing in 2019"  . Drug use: Never     Allergies   Penicillins   Review of Systems Review of Systems  All other systems reviewed and are negative.    Physical Exam Updated Vital Signs BP 111/77 (BP Location: Left Wrist)   Pulse 88   Temp 98.1 F (36.7 C) (Oral)   Resp 16   Ht 5'  2" (1.575 m)   Wt 48.9 kg   SpO2 98%   BMI 19.72 kg/m   Physical Exam Vitals signs and nursing note reviewed.  Constitutional:      Appearance: She is well-developed.  HENT:     Head: Normocephalic and atraumatic.  Cardiovascular:     Rate and Rhythm: Normal rate and regular rhythm.     Heart sounds: No murmur.  Pulmonary:     Effort: Pulmonary effort is normal. No respiratory distress.     Breath sounds: Normal breath sounds.  Abdominal:     Palpations: Abdomen is soft.     Tenderness: There is no abdominal tenderness. There is no guarding or rebound.  Musculoskeletal:        General: Swelling and tenderness present.     Comments: 2+ DP pulses bilaterally.  Deformity to mid femur with local tenderness.  LLE internally rotated and shorted.    Skin:    General: Skin is warm and dry.  Neurological:     Mental Status: She is alert and oriented to person, place, and time.  Psychiatric:        Behavior: Behavior normal.      ED Treatments / Results  Labs (all labs ordered are listed, but only abnormal results are displayed) Labs Reviewed  BASIC METABOLIC PANEL - Abnormal; Notable for the following components:      Result Value   Glucose, Bld 101 (*)    All other  components within normal limits  CBC WITH DIFFERENTIAL/PLATELET - Abnormal; Notable for the following components:   WBC 11.7 (*)    RDW 17.1 (*)    Platelets 556 (*)    All other components within normal limits  ALBUMIN - Abnormal; Notable for the following components:   Albumin 3.4 (*)    All other components within normal limits  C-REACTIVE PROTEIN - Abnormal; Notable for the following components:   CRP 2.9 (*)    All other components within normal limits  SEDIMENTATION RATE - Abnormal; Notable for the following components:   Sed Rate 45 (*)    All other components within normal limits  TSH - Abnormal; Notable for the following components:   TSH 0.072 (*)    All other components within normal limits  SARS CORONAVIRUS 2 (HOSPITAL ORDER, PERFORMED IN [facility])  URINE CULTURE  PHOSPHORUS  MAGNESIUM  PREALBUMIN  PROTIME-INR  CALCIUM, IONIZED  COMPREHENSIVE METABOLIC PANEL  HEMOGLOBIN A1C  VITAMIN D 25 HYDROXY (VIT D DEFICIENCY, FRACTURES)  CBC WITH DIFFERENTIAL/PLATELET  MAGNESIUM  PHOSPHORUS  URINALYSIS, COMPLETE (UACMP) WITH MICROSCOPIC  VITAMIN D 25 HYDROXY (VIT D DEFICIENCY, FRACTURES)  CBC  BASIC METABOLIC PANEL  TYPE AND SCREEN    EKG EKG Interpretation  Date/Time:  Tuesday October 22 2018 13:58:48 EDT Ventricular Rate:  103 PR Interval:    QRS Duration: 97 QT Interval:  335 QTC Calculation: 439 R Axis:   90 Text Interpretation:  Sinus tachycardia Borderline right axis deviation Low voltage, precordial leads no prior available for comparison Confirmed by Tilden Fossa [phone]) on 10/22/2018 3:11:00 PM   Radiology Chest Portable 1 View  Result Date: 10/22/2018 CLINICAL DATA:  Preop evaluation for upcoming surgery on left femur EXAM: PORTABLE CHEST 1 VIEW COMPARISON:  02/28/2017 FINDINGS: Cardiac shadow is within normal limits. Aortic calcifications are noted. Lungs are hyperinflated without focal infiltrate or sizable effusion. No acute bony  abnormality is noted. IMPRESSION: COPD without acute abnormality. Electronically Signed   By: Alcide Clever  M.D.   On: 10/22/2018 21:55   Dg Hip Unilat W Or Wo Pelvis 2-3 Views Left  Result Date: 10/22/2018 CLINICAL DATA:  Pain following walking with obvious femoral deformity, initial encounter EXAM: DG HIP (WITH OR WITHOUT PELVIS) 2-3V LEFT COMPARISON:  12/26/2017 FINDINGS: There is recurrent fracture in the area of previous midshaft femoral fracture with a fracture line through the previously placed fixation side plate. The hip prosthesis appears intact. Medial angulation of the distal fracture fragment is noted. The femoral component is not dislocated. The visualized pelvis appears within normal limits. IMPRESSION: Recurrent fracture in a similar location to that seen on the prior exam with subsequent fracture of the fixation sideplate. Electronically Signed   By: Alcide CleverMark  Lukens M.D.   On: 10/22/2018 15:13   Dg Femur Min 2 Views Left  Result Date: 10/22/2018 CLINICAL DATA:  Pain while walking with obvious deformity EXAM: LEFT FEMUR 2 VIEWS COMPARISON:  12/26/2017 FINDINGS: There is a recurrent fracture in the midshaft of the left femur adjacent to the tip of the hip prosthesis. The previously placed fixation sideplate demonstrates fracture at this level. Approximately 1/2 bone width displacement is noted as well. IMPRESSION: Recurrent fracture in the midshaft of the left femur in the area of prior fracture with additional fracture of the fixation side plate. Electronically Signed   By: Alcide CleverMark  Lukens M.D.   On: 10/22/2018 15:14    Procedures Procedures (including critical care time)  Medications Ordered in ED Medications  fentaNYL (SUBLIMAZE) injection 25 mcg (25 mcg Intravenous Given 10/22/18 2034)  ferrous sulfate tablet 325 mg (has no administration in time range)  ALPRAZolam Prudy Feeler(XANAX) tablet 1 mg (1 mg Oral Given 10/22/18 2146)  mirtazapine (REMERON) tablet 30 mg (30 mg Oral Not Given 10/22/18 2348)   pantoprazole (PROTONIX) EC tablet 40 mg (has no administration in time range)  calcium-vitamin D (OSCAL WITH D) 500-200 MG-UNIT per tablet 1 tablet (1 tablet Oral Not Given 10/22/18 2346)  oxymetazoline (AFRIN) 0.05 % nasal spray 1 spray (has no administration in time range)  HYDROcodone-acetaminophen (NORCO/VICODIN) 5-325 MG per tablet 1-2 tablet (1 tablet Oral Given 10/22/18 2146)  vitamin C (ASCORBIC ACID) tablet 500 mg (500 mg Oral Not Given 10/22/18 2349)  cholecalciferol (VITAMIN D3) tablet 2,000 Units (2,000 Units Oral Not Given 10/22/18 2346)  calcium citrate (CALCITRATE - dosed in mg elemental calcium) tablet 200 mg of elemental calcium (200 mg of elemental calcium Oral Not Given 10/22/18 2345)  methocarbamol (ROBAXIN) tablet 500 mg (500 mg Oral Given 10/22/18 2147)  enoxaparin (LOVENOX) injection 40 mg (40 mg Subcutaneous Not Given 10/22/18 2347)  multivitamin with minerals tablet 1 tablet (1 tablet Oral Not Given 10/22/18 2348)  0.9 %  sodium chloride infusion ( Intravenous New Bag/Given 10/22/18 2344)  docusate sodium (COLACE) capsule 100 mg (100 mg Oral Not Given 10/22/18 2346)  polyethylene glycol (MIRALAX / GLYCOLAX) packet 17 g (has no administration in time range)  bisacodyl (DULCOLAX) suppository 10 mg (has no administration in time range)  albuterol (PROVENTIL) (2.5 MG/3ML) 0.083% nebulizer solution 2.5 mg (has no administration in time range)  insulin aspart (novoLOG) injection 0-9 Units (has no administration in time range)  insulin aspart (novoLOG) injection 0-5 Units (0 Units Subcutaneous Not Given 10/22/18 2348)  fentaNYL (SUBLIMAZE) injection 50 mcg (50 mcg Intravenous Given 10/22/18 1421)     Initial Impression / Assessment and Plan / ED Course  I have reviewed the triage vital signs and the nursing notes.  Pertinent labs & imaging results  that were available during my care of the patient were reviewed by me and considered in my medical decision making (see chart for details).         Patient here for evaluation of left thigh pain that occurred suddenly while walking yesterday. She has a femur deformity on examination that correlates with the mid shaft femur fracture site of prior hardware. She is well perfused on examination. Discussed with Dr. Doreatha Martin, with orthopedics who recommends transfer to [facility] with hospitalist admission. Hospitalist consulted for admission. Patient updated of findings of studies and recommendation for admission and she is in agreement with treatment plan.  Final Clinical Impressions(s) / ED Diagnoses   Final diagnoses:  Other closed fracture of shaft of left femur, initial encounter [facility])    ED Discharge Orders    None       Quintella Reichert, MD 10/23/18 [phone]

## 2018-10-22 NOTE — ED Notes (Signed)
[facility] arrived to pick up patient.

## 2018-10-22 NOTE — ED Notes (Signed)
ED TO INPATIENT HANDOFF REPORT  ED Nurse Name and Phone #: [phone]/ Celeste  S Name/Age/Gender Melanie Parsons 63 y.o. female Room/Bed: WA08/WA08  Code Status   Code Status: Prior  Home/SNF/Other Home Patient oriented to: self, place, time and situation Is this baseline? Yes   Triage Complete: Triage complete  Chief Complaint left leg pain  Triage Note Pt arrived via EMS from Home. Pt has hx of Left femur fx repair, and pt reports that she was walking and femur popped out of place. Pt has obvious deformities. Pt reports 7/10 pain.   EMS 116/82, HR 100, RR 18, 94% RA, CBG 90.    Allergies Allergies  Allergen Reactions  . Penicillins Rash    Rash and itching    Level of Care/Admitting Diagnosis ED Disposition    ED Disposition Condition Comment   Admit  [facility] [[id number]]  Level of Care: Telemetry Medical [104]  Covid Evaluation: Confirmed COVID Negative  Diagnosis: Peri-prosthetic subtrochanteric femur fracture [[id number]][[id number]]  Admitting Physician: Marguerita MerlesSHEIKH, OMAIR LATIF [[id number]]  Attending Physician: Marguerita MerlesSHEIKH, OMAIR LATIF [[id number]]  Estimated length of stay: past midnight tomorrow  Certification:: I certify this patient will need inpatient services for at least 2 midnights  PT Class (Do Not Modify): Inpatient [101]  PT Acc Code (Do Not Modify): Private [1]       B Medical/Surgery History Past Medical History:  Diagnosis Date  . Anxiety   . Arthritis    "joints" (12/25/2017)  . GERD (gastroesophageal reflux disease)   . History of blood transfusion    "related RX I took; made my have my period q 2 wks"  . Migraine    "had one 3 days last week" (12/25/2017)  . Pneumonia    "walking pneumonia one times" (12/25/2017)  . Urinary tract infection 12/27/2017  . Wears glasses   . Wears partial dentures    Past Surgical History:  Procedure Laterality Date  . APPENDECTOMY  1990  . FEMUR FRACTURE SURGERY Left   . FRACTURE  SURGERY    . HARDWARE REMOVAL Left 12/26/2017   Procedure: HARDWARE REMOVAL;  Surgeon: Roby LoftsHaddix, Kevin P, MD;  Location: [facility] OR;  Service: Orthopedics;  Laterality: Left;  removal of plate, screws and cables.   . JOINT REPLACEMENT    . MULTIPLE TOOTH EXTRACTIONS    . ORIF PERIPROSTHETIC FRACTURE Left 12/26/2017   Procedure: OPEN REDUCTION INTERNAL FIXATION (ORIF) PERIPROSTHETIC FRACTURE;  Surgeon: Roby LoftsHaddix, Kevin P, MD;  Location: [facility] OR;  Service: Orthopedics;  Laterality: Left;  . SPLENECTOMY, TOTAL  1990  . TOTAL HIP ARTHROPLASTY Left 2016  . WRIST FRACTURE SURGERY Right    "put in titanium rod"     A IV Location/Drains/Wounds Patient Lines/Drains/Airways Status   Active Line/Drains/Airways    Name:   Placement date:   Placement time:   Site:   Days:   Peripheral IV 10/22/18 Left Antecubital   10/22/18    1420    Antecubital   less than 1   Incision (Closed) 12/26/17 Leg Left   12/26/17    1436     300          Intake/Output Last 24 hours No intake or output data in the 24 hours ending 10/22/18 1816  Labs/Imaging Results for orders placed or performed during the hospital encounter of 10/22/18 (from the past 48 hour(s))  Basic metabolic panel     Status: Abnormal   Collection Time: 10/22/18  2:15 PM  Result Value  Ref Range   Sodium 136 135 - 145 mmol/L   Potassium 4.4 3.5 - 5.1 mmol/L   Chloride 103 98 - 111 mmol/L   CO2 23 22 - 32 mmol/L   Glucose, Bld 101 (H) 70 - 99 mg/dL   BUN 17 8 - 23 mg/dL   Creatinine, Ser 0.56 0.44 - 1.00 mg/dL   Calcium 9.3 8.9 - 10.3 mg/dL   GFR calc non Af Amer >60 >60 mL/min   GFR calc Af Amer >60 >60 mL/min   Anion gap 10 5 - 15    Comment: Performed at [facility], [place] [address], [place], [place] [address]  CBC with Differential     Status: Abnormal   Collection Time: 10/22/18  2:15 PM  Result Value Ref Range   WBC 11.7 (H) 4.0 - 10.5 K/uL   RBC 4.66 3.87 - 5.11 MIL/uL   Hemoglobin 12.5 12.0 - 15.0 g/dL   HCT 38.8 36.0  - 46.0 %   MCV 83.3 80.0 - 100.0 fL   MCH 26.8 26.0 - 34.0 pg   MCHC 32.2 30.0 - 36.0 g/dL   RDW 17.1 (H) 11.5 - 15.5 %   Platelets 556 (H) 150 - 400 K/uL   nRBC 0.0 0.0 - 0.2 %   Neutrophils Relative % 65 %   Neutro Abs 7.7 1.7 - 7.7 K/uL   Lymphocytes Relative 26 %   Lymphs Abs 3.0 0.7 - 4.0 K/uL   Monocytes Relative 8 %   Monocytes Absolute 0.9 0.1 - 1.0 K/uL   Eosinophils Relative 0 %   Eosinophils Absolute 0.0 0.0 - 0.5 K/uL   Basophils Relative 1 %   Basophils Absolute 0.1 0.0 - 0.1 K/uL   Immature Granulocytes 0 %   Abs Immature Granulocytes 0.02 0.00 - 0.07 K/uL    Comment: Performed at [facility], [place] [address] [place], [place] [address]  SARS Coronavirus 2 (CEPHEID - Performed in [place] hospital lab), Hosp Order     Status: None   Collection Time: 10/22/18  2:21 PM   Specimen: Nasopharyngeal Swab  Result Value Ref Range   SARS Coronavirus 2 NEGATIVE NEGATIVE    Comment: (NOTE) If result is NEGATIVE SARS-CoV-2 target nucleic acids are NOT DETECTED. The SARS-CoV-2 RNA is generally detectable in upper and lower  respiratory specimens during the acute phase of infection. The lowest  concentration of SARS-CoV-2 viral copies this assay can detect is 250  copies / mL. A negative result does not preclude SARS-CoV-2 infection  and should not be used as the sole basis for treatment or other  patient management decisions.  A negative result may occur with  improper specimen collection / handling, submission of specimen other  than nasopharyngeal swab, presence of viral mutation(s) within the  areas targeted by this assay, and inadequate number of viral copies  (<250 copies / mL). A negative result must be combined with clinical  observations, patient history, and epidemiological information. If result is POSITIVE SARS-CoV-2 target nucleic acids are DETECTED. The SARS-CoV-2 RNA is generally detectable in upper and lower  respiratory specimens  dur ing the acute phase of infection.  Positive  results are indicative of active infection with SARS-CoV-2.  Clinical  correlation with patient history and other diagnostic information is  necessary to determine patient infection status.  Positive results do  not rule out bacterial infection or co-infection with other viruses. If result is PRESUMPTIVE POSTIVE SARS-CoV-2 nucleic acids MAY BE PRESENT.  A presumptive positive result was obtained on the submitted specimen  and confirmed on repeat testing.  While 2019 novel coronavirus  (SARS-CoV-2) nucleic acids may be present in the submitted sample  additional confirmatory testing may be necessary for epidemiological  and / or clinical management purposes  to differentiate between  SARS-CoV-2 and other Sarbecovirus currently known to infect humans.  If clinically indicated additional testing with an alternate test  methodology [phone]) is advised. The SARS-CoV-2 RNA is generally  detectable in upper and lower respiratory sp ecimens during the acute  phase of infection. The expected result is Negative. Fact Sheet for Patients:  BoilerBrush.com.cyhttps://www.fda.gov/media/136312/download Fact Sheet for Healthcare Providers: https://pope.com/https://www.fda.gov/media/136313/download This test is not yet approved or cleared by the [place] FDA and has been authorized for detection and/or diagnosis of SARS-CoV-2 by FDA under an Emergency Use Authorization (EUA).  This EUA will remain in effect (meaning this test can be used) for the duration of the COVID-19 declaration under Section 564(b)(1) of the Act, 21 U.S.C. section 360bbb-3(b)(1), unless the authorization is terminated or revoked sooner. Performed at [facility], [address] [place], [place] [address]    Dg Hip Unilat W Or Wo Pelvis 2-3 Views Left  Result Date: 10/22/2018 CLINICAL DATA:  Pain following walking with obvious femoral deformity, initial encounter EXAM: DG HIP (WITH OR  WITHOUT PELVIS) 2-3V LEFT COMPARISON:  12/26/2017 FINDINGS: There is recurrent fracture in the area of previous midshaft femoral fracture with a fracture line through the previously placed fixation side plate. The hip prosthesis appears intact. Medial angulation of the distal fracture fragment is noted. The femoral component is not dislocated. The visualized pelvis appears within normal limits. IMPRESSION: Recurrent fracture in a similar location to that seen on the prior exam with subsequent fracture of the fixation sideplate. Electronically Signed   By: Alcide CleverMark  Lukens M.D.   On: 10/22/2018 15:13   Dg Femur Min 2 Views Left  Result Date: 10/22/2018 CLINICAL DATA:  Pain while walking with obvious deformity EXAM: LEFT FEMUR 2 VIEWS COMPARISON:  12/26/2017 FINDINGS: There is a recurrent fracture in the midshaft of the left femur adjacent to the tip of the hip prosthesis. The previously placed fixation sideplate demonstrates fracture at this level. Approximately 1/2 bone width displacement is noted as well. IMPRESSION: Recurrent fracture in the midshaft of the left femur in the area of prior fracture with additional fracture of the fixation side plate. Electronically Signed   By: Alcide CleverMark  Lukens M.D.   On: 10/22/2018 15:14    Pending Labs Unresulted Labs (From admission, onward)    Start     Ordered   10/23/18 0500  Comprehensive metabolic panel  Tomorrow morning,   R     10/22/18 1701   10/23/18 0500  CBC with Differential/Platelet  Tomorrow morning,   R     10/22/18 1749   10/23/18 0500  Magnesium  Tomorrow morning,   R     10/22/18 1749   10/23/18 0500  Phosphorus  Tomorrow morning,   R     10/22/18 1749   10/22/18 1659  Calcium, ionized  Once,   STAT     10/22/18 1701   10/22/18 1659  Hemoglobin A1c  Once,   STAT     10/22/18 1701   10/22/18 1659  Phosphorus  Once,   STAT     10/22/18 1701   10/22/18 1659  Magnesium  Once,   STAT     10/22/18 1701   10/22/18  1659  Prealbumin  Once,   STAT      10/22/18 1701   10/22/18 1658  Albumin  Once,   STAT     10/22/18 1701   10/22/18 1658  C-reactive protein  Once,   STAT     10/22/18 1701   10/22/18 1658  Sedimentation rate  Once,   STAT     10/22/18 1701   10/22/18 1658  TSH  Once,   STAT     10/22/18 1701   10/22/18 1658  VITAMIN D 25 Hydroxy (Vit-D Deficiency, Fractures)  Once,   STAT     10/22/18 1701   Signed and Held  Creatinine, serum  (enoxaparin (LOVENOX)    CrCl >/= 30 ml/min)  Weekly,   R    Comments: while on enoxaparin therapy    Signed and Held   Signed and Held  Urinalysis, Complete w Microscopic  Once,   R     Signed and Held   Signed and Held  Urine culture  Once,   R     Signed and Held   Signed and Held  VITAMIN D 25 Hydroxy (Vit-D Deficiency, Fractures)  Add-on,   R     Signed and Held   Signed and Held  Protime-INR  Add-on,   R     Signed and Held   Signed and Held  CBC  Tomorrow morning,   R     Signed and Medical illustratorHeld   Signed and Armed forces training and education officerHeld  Basic metabolic panel  Tomorrow morning,   R     Signed and Held   Signed and Held  Type and screen [facility]  Once,   R    Comments:  [facility]    Signed and Held          Vitals/Pain Today's Vitals   10/22/18 1400 10/22/18 1430 10/22/18 1800 10/22/18 1814  BP:      Pulse: 98 (!) 103 87 90  Resp: (!) 21 (!) 27    Temp:      SpO2: 98% 99% 97% 98%  Weight:      Height:      PainSc:        Isolation Precautions No active isolations  Medications Medications  albuterol (PROVENTIL) (2.5 MG/3ML) 0.083% nebulizer solution 2.5 mg (has no administration in time range)  insulin aspart (novoLOG) injection 0-9 Units (has no administration in time range)  insulin aspart (novoLOG) injection 0-5 Units (has no administration in time range)  fentaNYL (SUBLIMAZE) injection 50 mcg (50 mcg Intravenous Given 10/22/18 1421)    Mobility non-ambulatory Low fall risk   Focused Assessments    R Recommendations: See Admitting Provider  Note  Report given to:   Additional Notes:  [place]

## 2018-10-22 NOTE — ED Notes (Signed)
[facility] Called 

## 2018-10-23 ENCOUNTER — Other Ambulatory Visit: Payer: Self-pay

## 2018-10-23 LAB — BASIC METABOLIC PANEL
Anion gap: 12 (ref 5–15)
BUN: 17 mg/dL (ref 8–23)
CO2: 19 mmol/L — ABNORMAL LOW (ref 22–32)
Calcium: 9 mg/dL (ref 8.9–10.3)
Chloride: 104 mmol/L (ref 98–111)
Creatinine, Ser: 0.61 mg/dL (ref 0.44–1.00)
GFR calc Af Amer: >60 mL/min (ref >60)
GFR calc non Af Amer: >60 mL/min (ref >60)
Glucose, Bld: 89 mg/dL (ref 70–99)
Potassium: 4.4 mmol/L (ref 3.5–5.1)
Sodium: 135 mmol/L (ref 135–145)

## 2018-10-23 LAB — CALCIUM, IONIZED: Calcium, Ionized, Serum: 5.1 mg/dL (ref 4.5–5.6)

## 2018-10-23 LAB — URINALYSIS, COMPLETE (UACMP) WITH MICROSCOPIC
Bilirubin Urine: NEGATIVE
Glucose, UA: NEGATIVE mg/dL
Ketones, ur: 5 mg/dL — AB
Nitrite: POSITIVE — AB
Protein, ur: NEGATIVE mg/dL
Specific Gravity, Urine: 1.019 (ref 1.005–1.030)
pH: 5 (ref 5.0–8.0)

## 2018-10-23 LAB — CBC
HCT: 35.4 % — ABNORMAL LOW (ref 36.0–46.0)
Hemoglobin: 11.4 g/dL — ABNORMAL LOW (ref 12.0–15.0)
MCH: 26.6 pg (ref 26.0–34.0)
MCHC: 32.2 g/dL (ref 30.0–36.0)
MCV: 82.5 fL (ref 80.0–100.0)
Platelets: 518 10*3/uL — ABNORMAL HIGH (ref 150–400)
RBC: 4.29 MIL/uL (ref 3.87–5.11)
RDW: 16.9 % — ABNORMAL HIGH (ref 11.5–15.5)
WBC: 10.2 10*3/uL (ref 4.0–10.5)
nRBC: 0 % (ref 0.0–0.2)

## 2018-10-23 LAB — HEMOGLOBIN A1C
Hgb A1c MFr Bld: 5.6 % (ref 4.8–5.6)
Mean Plasma Glucose: 114.02 mg/dL

## 2018-10-23 LAB — GLUCOSE, CAPILLARY
Glucose-Capillary: 80 mg/dL (ref 70–99)
Glucose-Capillary: 120 mg/dL — ABNORMAL HIGH (ref 70–99)
Glucose-Capillary: 96 mg/dL (ref 70–99)
Glucose-Capillary: 138 mg/dL — ABNORMAL HIGH (ref 70–99)

## 2018-10-23 LAB — VITAMIN D 25 HYDROXY (VIT D DEFICIENCY, FRACTURES): Vit D, 25-Hydroxy: 63.8 ng/mL (ref 30.0–100.0)

## 2018-10-23 LAB — MRSA PCR SCREENING: MRSA by PCR: NEGATIVE

## 2018-10-23 MED ORDER — GLUCERNA SHAKE PO LIQD
237.0000 mL | Freq: Two times a day (BID) | ORAL | Status: DC
  Administered 2018-10-23 – 2018-10-28 (×6): 237 mL via ORAL

## 2018-10-23 NOTE — Progress Notes (Signed)
PT Cancellation Note  Patient Details Name: Melanie Parsons MRN: [id number] DOB: 09/26/1955   Cancelled Treatment:    Reason Eval/Treat Not Completed: Patient not medically ready;Active bedrest order. 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 PT to await orders following sx. It appears (per NPO orders) surgery scheduled for tomorrow, 7/9.    Lorriane Shire 10/23/2018, 8:20 AM   Lorrin Goodell, PT  Office # [phone] Pager [phone]

## 2018-10-23 NOTE — Progress Notes (Signed)
CSW acknowledges SNF consult and notes patient's revisions surgery tomorrow 7/9. Will continue to follow for PT/OT recommendations for discharge planning.   [place], [place]

## 2018-10-23 NOTE — Progress Notes (Signed)
PROGRESS NOTE    Melanie BlossomSandra Kay Rush Mccready  EAV:[id number] DOB: [birth year] DOA: 10/22/2018 PCP: [name], [name], MD    Brief Narrative:   Melanie Parsons is a 63 y.o. female with medical history significant for but not limited to depression and anxiety, GERD, tobacco abuse, arthritis, history of migraines, history of urinary tract infections, history of left femur and hip fracture and other comorbidities who presents with a chief complaint of left leg pain.  Patient states that she was ambulating yesterday from her room to the bathroom and felt a sudden onset of sharp pain.  States that she thought she was going to fall so she grabbed onto the wall and asked her daughter to bring in a rolling chair and set on rolling chair.  Patient got up a few times and transfer to a bedside commode since then but today was unable to ambulate and put pressure on her leg.  She also notes that her leg started swelling around the hip area which was new since overnight.  She denies any chest pain, lightheadedness or dizziness.  No nausea or vomiting.  Still smokes sometimes.  No other concerns or plans at this time and [facility] was asked to admit this patient for a left leg periprosthetic femur fracture that was recurrent and Orthopedic Surgery requested the patient be [facility] to [facility] for further evaluation.  In the ED the patient had basic blood work done as well as imaging of her hip and femur.  She was also given pain control   Assessment & Plan:   Active Problems:   COPD (chronic obstructive pulmonary disease) (HCC)   Generalized anxiety disorder   Depression   New onset type 2 diabetes mellitus (HCC)   Iron deficiency anemia   Peri-prosthetic subtrochanteric femur fracture   Femur fracture (HCC)   Left periprosthetic femur fracture Patient with acute onset left lower extremity pain while ambulating yesterday, denies fall.  Left femur x-ray notable for recurrent fracture in the midshaft of the left  femur in the area of prior fracture with additional fracture of the fixation sideplate. --Orthopedics following, Dr. Jena GaussHaddix; plan operative management on 10/24/2018 --N.p.o. after midnight --PT/OT consults pending surgical intervention  GERD: Continue PPI  Iron deficiency anemia Hemoglobin stable 12.5 on admission.     --Hgb 12.5-->11.4 --Continue home ferrous sulfate 325 mg p.o. twice daily. --continue to monitor H/H closely in the setting of femur fracture with planned surgical intervention.  Type 2 diabetes mellitus Hemoglobin A1c 5.6.  Diet controlled. --Continue to monitor CBGs and insulin sliding scale prn  Hx COPD Not in acute exacerbation.  Not on any inhaler therapy outpatient.  Oxygenating well on room air. --nebs prn  Depression/anxiety Continue home alprazolam 1 mg p.o. 3 times daily, mirtazapine 30 mg p.o. nightly   DVT prophylaxis: Lovenox Code Status: Full code Family Communication: None Disposition Plan: Continue inpatient, planned surgical intervention for femur fracture tomorrow, pending PT/OT evaluation, likely SNF versus home health   Consultants:   Orthopedics, Dr. Jena GaussHaddix  Procedures:   none  Antimicrobials:   none   Subjective: Patient seen and examined at bedside, continues with mild to moderate left lower extremity pain when she moves her extremity.  Awaiting for surgical prevention, now planned for tomorrow.  No other complaints at this time.  Denies headache, no fever/chills/night sweats, no nausea/vomiting/diarrhea, no chest pain, no palpitations, no shortness of breath, no abdominal pain, no cough/congestion.  No acute events overnight per nursing staff.  Objective:  Vitals:   10/23/18 0031 10/23/18 0351 10/23/18 0813 10/23/18 1041  BP: 104/69 112/69 103/66 105/67  Pulse: 75 76 72 84  Resp: 18 16 16 16   Temp: 97.8 F (36.6 C) 98 F (36.7 C) 97.7 F (36.5 C) 98.2 F (36.8 C)  TempSrc: Oral Oral Oral Oral  SpO2: 98% 98% 98% 98%   Weight:      Height:        Intake/Output Summary (Last 24 hours) at 10/23/2018 1353 Last data filed at 10/23/2018 0400 Gross per 24 hour  Intake 372.32 ml  Output -  Net 372.32 ml   Filed Weights   10/22/18 1254 10/22/18 2111  Weight: 47.6 kg 48.9 kg    Examination:  General exam: Appears calm, mildly discomfort when moves lower extremity Respiratory system: Clear to auscultation. Respiratory effort normal. Cardiovascular system: S1 & S2 heard, RRR. No JVD, murmurs, rubs, gallops or clicks. No pedal edema. Gastrointestinal system: Abdomen is nondistended, soft and nontender. No organomegaly or masses felt. Normal bowel sounds heard. Central nervous system: Alert and oriented. No focal neurological deficits. Extremities: Left lower extremity shortened with medial rotation Skin: No rashes, lesions or ulcers Psychiatry: Judgement and insight appear normal. Mood & affect appropriate.     Data Reviewed: I have personally reviewed following labs and imaging studies  CBC: Recent Labs  Lab 10/22/18 1415 10/23/18 0246  WBC 11.7* 10.2  NEUTROABS 7.7  --   HGB 12.5 11.4*  HCT 38.8 35.4*  MCV 83.3 82.5  PLT 556* 518*   Basic Metabolic Panel: Recent Labs  Lab 10/22/18 1415 10/22/18 1851 10/23/18 0246  NA 136  --  135  K 4.4  --  4.4  CL 103  --  104  CO2 23  --  19*  GLUCOSE 101*  --  89  BUN 17  --  17  CREATININE 0.56  --  0.61  CALCIUM 9.3  --  9.0  MG  --  2.1  --   PHOS  --  3.9  --    GFR: Estimated Creatinine Clearance: 55.6 mL/min (by C-G formula based on SCr of 0.61 mg/dL). Liver Function Tests: Recent Labs  Lab 10/22/18 1851  ALBUMIN 3.4*   No results for input(s): LIPASE, AMYLASE in the last 168 hours. No results for input(s): AMMONIA in the last 168 hours. Coagulation Profile: Recent Labs  Lab 10/22/18 2243  INR 1.1   Cardiac Enzymes: No results for input(s): CKTOTAL, CKMB, CKMBINDEX, TROPONINI in the last 168 hours. BNP (last 3 results) No  results for input(s): PROBNP in the last 8760 hours. HbA1C: Recent Labs    10/22/18 1851  HGBA1C 5.6   CBG: Recent Labs  Lab 10/23/18 0815 10/23/18 1131  GLUCAP 80 120*   Lipid Profile: No results for input(s): CHOL, HDL, LDLCALC, TRIG, CHOLHDL, LDLDIRECT in the last 72 hours. Thyroid Function Tests: Recent Labs    10/22/18 1851  TSH 0.072*   Anemia Panel: No results for input(s): VITAMINB12, FOLATE, FERRITIN, TIBC, IRON, RETICCTPCT in the last 72 hours. Sepsis Labs: No results for input(s): PROCALCITON, LATICACIDVEN in the last 168 hours.  Recent Results (from the past 240 hour(s))  SARS Coronavirus 2 (CEPHEID - Performed in [facility] hospital lab), Hosp Order     Status: None   Collection Time: 10/22/18  2:21 PM   Specimen: Nasopharyngeal Swab  Result Value Ref Range Status   SARS Coronavirus 2 NEGATIVE NEGATIVE Final    Comment: (NOTE) If result is NEGATIVE  SARS-CoV-2 target nucleic acids are NOT DETECTED. The SARS-CoV-2 RNA is generally detectable in upper and lower  respiratory specimens during the acute phase of infection. The lowest  concentration of SARS-CoV-2 viral copies this assay can detect is 250  copies / mL. A negative result does not preclude SARS-CoV-2 infection  and should not be used as the sole basis for treatment or other  patient management decisions.  A negative result may occur with  improper specimen collection / handling, submission of specimen other  than nasopharyngeal swab, presence of viral mutation(s) within the  areas targeted by this assay, and inadequate number of viral copies  (<250 copies / mL). A negative result must be combined with clinical  observations, patient history, and epidemiological information. If result is POSITIVE SARS-CoV-2 target nucleic acids are DETECTED. The SARS-CoV-2 RNA is generally detectable in upper and lower  respiratory specimens dur ing the acute phase of infection.  Positive  results are indicative  of active infection with SARS-CoV-2.  Clinical  correlation with patient history and other diagnostic information is  necessary to determine patient infection status.  Positive results do  not rule out bacterial infection or co-infection with other viruses. If result is PRESUMPTIVE POSTIVE SARS-CoV-2 nucleic acids MAY BE PRESENT.   A presumptive positive result was obtained on the submitted specimen  and confirmed on repeat testing.  While 2019 novel coronavirus  (SARS-CoV-2) nucleic acids may be present in the submitted sample  additional confirmatory testing may be necessary for epidemiological  and / or clinical management purposes  to differentiate between  SARS-CoV-2 and other Sarbecovirus currently known to infect humans.  If clinically indicated additional testing with an alternate test  methodology [phone] is advised. The SARS-CoV-2 RNA is generally  detectable in upper and lower respiratory sp ecimens during the acute  phase of infection. The expected result is Negative. Fact Sheet for Patients:  BoilerBrush.com.cyhttps://www.fda.gov/media/136312/download Fact Sheet for Healthcare Providers: https://pope.com/https://www.fda.gov/media/136313/download This test is not yet approved or cleared by the [place] FDA and has been authorized for detection and/or diagnosis of SARS-CoV-2 by FDA under an Emergency Use Authorization (EUA).  This EUA will remain in effect (meaning this test can be used) for the duration of the COVID-19 declaration under Section 564(b)(1) of the Act, 21 U.S.C. section 360bbb-3(b)(1), unless the authorization is terminated or revoked sooner. Performed at [facility], [address] [place], [place] [address]   MRSA PCR Screening     Status: None   Collection Time: 10/23/18  6:07 AM   Specimen: Nasal Mucosa; Nasopharyngeal  Result Value Ref Range Status   MRSA by PCR NEGATIVE NEGATIVE Final    Comment:        The GeneXpert MRSA Assay (FDA approved for NASAL  specimens only), is one component of a comprehensive MRSA colonization surveillance program. It is not intended to diagnose MRSA infection nor to guide or monitor treatment for MRSA infections. Performed at [facility], [address] [place], [place] [address]          Radiology Studies: Chest Portable 1 View  Result Date: 10/22/2018 CLINICAL DATA:  Preop evaluation for upcoming surgery on left femur EXAM: PORTABLE CHEST 1 VIEW COMPARISON:  02/28/2017 FINDINGS: Cardiac shadow is within normal limits. Aortic calcifications are noted. Lungs are hyperinflated without focal infiltrate or sizable effusion. No acute bony abnormality is noted. IMPRESSION: COPD without acute abnormality. Electronically Signed   By: Alcide CleverMark  Lukens M.D.   On: 10/22/2018 21:55  Dg Hip Unilat W Or Wo Pelvis 2-3 Views Left  Result Date: 10/22/2018 CLINICAL DATA:  Pain following walking with obvious femoral deformity, initial encounter EXAM: DG HIP (WITH OR WITHOUT PELVIS) 2-3V LEFT COMPARISON:  12/26/2017 FINDINGS: There is recurrent fracture in the area of previous midshaft femoral fracture with a fracture line through the previously placed fixation side plate. The hip prosthesis appears intact. Medial angulation of the distal fracture fragment is noted. The femoral component is not dislocated. The visualized pelvis appears within normal limits. IMPRESSION: Recurrent fracture in a similar location to that seen on the prior exam with subsequent fracture of the fixation sideplate. Electronically Signed   By: Inez Catalina M.D.   On: 10/22/2018 15:13   Dg Femur Min 2 Views Left  Result Date: 10/22/2018 CLINICAL DATA:  Pain while walking with obvious deformity EXAM: LEFT FEMUR 2 VIEWS COMPARISON:  12/26/2017 FINDINGS: There is a recurrent fracture in the midshaft of the left femur adjacent to the tip of the hip prosthesis. The previously placed fixation sideplate demonstrates fracture at this level. Approximately 1/2  bone width displacement is noted as well. IMPRESSION: Recurrent fracture in the midshaft of the left femur in the area of prior fracture with additional fracture of the fixation side plate. Electronically Signed   By: Inez Catalina M.D.   On: 10/22/2018 15:14        Scheduled Meds: . ALPRAZolam  1 mg Oral TID  . calcium citrate  200 mg of elemental calcium Oral BID  . calcium-vitamin D  1 tablet Oral Daily  . cholecalciferol  2,000 Units Oral BID  . docusate sodium  100 mg Oral BID  . enoxaparin  40 mg Subcutaneous Q24H  . feeding supplement (GLUCERNA SHAKE)  237 mL Oral BID BM  . ferrous sulfate  325 mg Oral BID WC  . insulin aspart  0-5 Units Subcutaneous QHS  . insulin aspart  0-9 Units Subcutaneous TID WC  . mirtazapine  30 mg Oral QHS  . multivitamin with minerals  1 tablet Oral Daily  . pantoprazole  40 mg Oral Daily  . ascorbic acid  500 mg Oral Daily   Continuous Infusions: . sodium chloride 75 mL/hr at 10/22/18 2344     LOS: 1 day    Time spent: 29 minutes    Jannatul Wojdyla J [place] DO [facility] Pager [phone]  If 7PM-7AM, please contact night-coverage [email or web address] Password [email or web address] 10/23/2018, 1:53 PM

## 2018-10-23 NOTE — Plan of Care (Signed)
Problem: Education: Goal: Knowledge of General Education information will improve Description: Including pain rating scale, medication(s)/side effects and non-pharmacologic comfort measures Outcome: Progressing   Problem: Health Behavior/Discharge Planning: Goal: Ability to manage health-related needs will improve Outcome: Progressing   Problem: Clinical Measurements: Goal: Ability to maintain clinical measurements within normal limits will improve Outcome: Progressing Goal: Respiratory complications will improve Outcome: Progressing Goal: Cardiovascular complication will be avoided Outcome: Progressing   Problem: Nutrition: Goal: Adequate nutrition will be maintained Outcome: Progressing   Problem: Coping: Goal: Level of anxiety will decrease Outcome: Progressing   Problem: Pain Managment: Goal: General experience of comfort will improve Outcome: Progressing   Problem: Safety: Goal: Ability to remain free from injury will improve Outcome: Progressing   Problem: Skin Integrity: Goal: Risk for impaired skin integrity will decrease Outcome: Progressing   

## 2018-10-23 NOTE — Progress Notes (Signed)
Pt arrived from [facility] via Care [facility]. LLE abnormal position and Hip swollen. Pt rated pain 10/10. Prn Fentanyl (see mar) Ice applied. PCR MRSA sent. Pt maintained NPO per orders. Bed exit alarm maintained. CHG completed, call light and phone in reach. IVF's started.

## 2018-10-23 NOTE — Progress Notes (Signed)
Orthopedic Tech Progress Note Patient Details:  Melanie Parsons [birth year] [id number]  Ortho Devices Ortho Device/Splint Location: Trapeze bar Ortho Device/Splint Interventions: Application   Post Interventions Patient Tolerated: Well Instructions Provided: Care of device   [place] Pink 10/23/2018, 4:39 PM

## 2018-10-23 NOTE — Plan of Care (Signed)
  Problem: Pain Managment: Goal: General experience of comfort will improve Outcome: Progressing   Problem: Safety: Goal: Ability to remain free from injury will improve Outcome: Progressing   

## 2018-10-23 NOTE — Progress Notes (Signed)
RN called and updated pt's husband, Ashling Roane, on pt status. All questions answered to satisfaction. Will continue to monitor.

## 2018-10-23 NOTE — H&P (View-Only) (Signed)
Orthopaedic Trauma Service (OTS) Consult   Patient ID: Melanie Parsons MRN: [id number] DOB/AGE: [birth year] 64 y.o.  Reason for Consult:Left femoral nonunion Referring Physician: Dr. Tilden FossaElizabeth Rees, MD [facility]  HPI: Melanie Parsons is an 63 y.o. female who is being seen in consultation at the request of Dr. 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  The patient is known to me from a previous nonunion repair in September 2019.  She had a newly diagnosed diabetes as well as significant malnutrition subsequently had done well.  The last time I saw her was in December 2019.  She presented to the emergency room after having her leg give out from underneath her.  She has had some discomfort but no real pain.  She was just ambulating no elucidating trauma.  The patient states that she has better control of her diabetes.  Denies any major issues.  Having severe pain in her leg right now with obvious deformity.  Hurts with any attempted motion.  Denies any recent illnesses.  Past Medical History:  Diagnosis Date  . Anxiety   . Arthritis    "joints" (12/25/2017)  . GERD (gastroesophageal reflux disease)   . History of blood transfusion    "related RX I took; made my have my period q 2 wks"  . Migraine    "had one 3 days last week" (12/25/2017)  . Pneumonia    "walking pneumonia one times" (12/25/2017)  . Urinary tract infection 12/27/2017  . Wears glasses   . Wears partial dentures     Past Surgical History:  Procedure Laterality Date  . APPENDECTOMY  1990  . FEMUR FRACTURE SURGERY Left   . FRACTURE SURGERY    . HARDWARE REMOVAL Left 12/26/2017   Procedure: HARDWARE REMOVAL;  Surgeon: Roby LoftsHaddix, Kevin P, MD;  Location: [facility] OR;  Service: Orthopedics;  Laterality: Left;  removal of plate, screws and cables.   . JOINT REPLACEMENT    . MULTIPLE TOOTH EXTRACTIONS    . ORIF PERIPROSTHETIC FRACTURE Left 12/26/2017   Procedure: OPEN REDUCTION INTERNAL FIXATION (ORIF) PERIPROSTHETIC  FRACTURE;  Surgeon: Roby LoftsHaddix, Kevin P, MD;  Location: [facility] OR;  Service: Orthopedics;  Laterality: Left;  . SPLENECTOMY, TOTAL  1990  . TOTAL HIP ARTHROPLASTY Left 2016  . WRIST FRACTURE SURGERY Right    "put in titanium rod"    History reviewed. No pertinent family history.  Social History:  reports that she has been smoking cigarettes. She has a 15.00 pack-year smoking history. She has never used smokeless tobacco. She reports previous alcohol use. She reports that she does not use drugs.  Allergies:  Allergies  Allergen Reactions  . Penicillins Rash    Rash and itching    Medications:  No current facility-administered medications on file prior to encounter.    Current Outpatient Medications on File Prior to Encounter  Medication Sig Dispense Refill  . acetaminophen (TYLENOL) 500 MG tablet Take 1,000 mg by mouth as needed for mild pain.    Marland Kitchen. ALPRAZolam (XANAX) 1 MG tablet Take 1 mg by mouth 3 (three) times daily.    . calcium-vitamin D (OSCAL WITH D) 500-200 MG-UNIT tablet Take 1 tablet by mouth daily.    . Cyanocobalamin (VITAMIN B-12 PO) Take 1 tablet by mouth daily.    . mirtazapine (REMERON) 30 MG tablet Take 30 mg by mouth at bedtime.    . Multiple Vitamins-Minerals (MULTIVITAMIN ADULT PO) Take 1 tablet by mouth daily.    Marland Kitchen. omeprazole (PRILOSEC)  20 MG capsule Take 20 mg by mouth daily.    Marland Kitchen. oxymetazoline (AFRIN NODRIP SEVERE CONGEST) 0.05 % nasal spray Place 1 spray into both nostrils as needed for congestion.    . calcium citrate (CALCITRATE - DOSED IN MG ELEMENTAL CALCIUM) 950 MG tablet Take 1 tablet (200 mg of elemental calcium total) by mouth 2 (two) times daily. (Patient not taking: Reported on 10/22/2018) 30 tablet 0  . cholecalciferol 2000 units TABS Take 1 tablet (2,000 Units total) by mouth 2 (two) times daily. (Patient not taking: Reported on 10/22/2018) 60 tablet 0  . enoxaparin (LOVENOX) 40 MG/0.4ML injection Inject 0.4 mLs (40 mg total) into the skin daily. 20 mL 0  .  ferrous sulfate 325 (65 FE) MG tablet Take 1 tablet (325 mg total) by mouth 2 (two) times daily with a meal. (Patient not taking: Reported on 10/22/2018) 60 tablet 0  . HYDROcodone-acetaminophen (NORCO/VICODIN) 5-325 MG tablet Take 2 tablets by mouth every 6 (six) hours as needed for moderate pain. (Patient not taking: Reported on 10/22/2018) 30 tablet 0  . lactose free nutrition (BOOST PLUS) LIQD Take 237 mLs by mouth 3 (three) times daily with meals. (Patient not taking: Reported on 10/22/2018) 90 Can 0  . methocarbamol (ROBAXIN) 500 MG tablet Take 1 tablet (500 mg total) by mouth every 8 (eight) hours as needed for muscle spasms. (Patient not taking: Reported on 10/22/2018) 20 tablet 0  . Multiple Vitamin (MULTIVITAMIN WITH MINERALS) TABS tablet Take 1 tablet by mouth daily. (Patient not taking: Reported on 10/22/2018) 30 tablet 0  . polyethylene glycol (MIRALAX) packet Take 17 g by mouth daily as needed for mild constipation. (Patient not taking: Reported on 10/22/2018) 14 each 0  . vitamin C (VITAMIN C) 500 MG tablet Take 1 tablet (500 mg total) by mouth daily. (Patient not taking: Reported on 10/22/2018) 30 tablet 0    ROS: Constitutional: No fever or chills Vision: No changes in vision ENT: No difficulty swallowing CV: No chest pain Pulm: No SOB or wheezing GI: No nausea or vomiting GU: No urgency or inability to hold urine Skin: No poor wound healing Neurologic: No numbness or tingling Psychiatric: No depression or anxiety Heme: No bruising Allergic: No reaction to medications or food   Exam: Blood pressure 103/66, pulse 72, temperature 97.7 F (36.5 C), temperature source Oral, resp. rate 16, height 5\' 2"  (1.575 m), weight 48.9 kg, SpO2 98 %. General: No acute distress Orientation: Awake alert and oriented x3 Mood and Affect: Cooperative and pleasant Gait: Unable to assess due to her deformity of her leg and up pain Coordination and balance: Within normal limits  Left lower extremity:  Obvious varus deformity and bowing to the leg.  No skin tenting or skin compromise.  Lateral incision is well-healed without any signs of drainage or wound healing problems.  Active dorsiflexion plantarflexion of the foot and toes.  Intact sensation the dorsum and plantar aspect of the foot.  Warm well-perfused foot.  Compartments are soft and compressible.  Unable to tolerate any range of motion of the knee or hip.  Right lower extremity: Skin without lesions. No tenderness to palpation. Full painless ROM, full strength in each muscle groups without evidence of instability.   Medical Decision Making: Imaging: X-rays of the left femur and hip show previous lateral plate in place that is broken with a significant varus malalignment.  Nonunion site still has no healing and no signs of callus formation.  Labs:  Results for orders placed  or performed during the hospital encounter of 10/22/18 (from the past 24 hour(s))  Basic metabolic panel     Status: Abnormal   Collection Time: 10/22/18  2:15 PM  Result Value Ref Range   Sodium 136 135 - 145 mmol/L   Potassium 4.4 3.5 - 5.1 mmol/L   Chloride 103 98 - 111 mmol/L   CO2 23 22 - 32 mmol/L   Glucose, Bld 101 (H) 70 - 99 mg/dL   BUN 17 8 - 23 mg/dL   Creatinine, Ser 0.56 0.44 - 1.00 mg/dL   Calcium 9.3 8.9 - 10.3 mg/dL   GFR calc non Af Amer >60 >60 mL/min   GFR calc Af Amer >60 >60 mL/min   Anion gap 10 5 - 15  CBC with Differential     Status: Abnormal   Collection Time: 10/22/18  2:15 PM  Result Value Ref Range   WBC 11.7 (H) 4.0 - 10.5 K/uL   RBC 4.66 3.87 - 5.11 MIL/uL   Hemoglobin 12.5 12.0 - 15.0 g/dL   HCT 38.8 36.0 - 46.0 %   MCV 83.3 80.0 - 100.0 fL   MCH 26.8 26.0 - 34.0 pg   MCHC 32.2 30.0 - 36.0 g/dL   RDW 17.1 (H) 11.5 - 15.5 %   Platelets 556 (H) 150 - 400 K/uL   nRBC 0.0 0.0 - 0.2 %   Neutrophils Relative % 65 %   Neutro Abs 7.7 1.7 - 7.7 K/uL   Lymphocytes Relative 26 %   Lymphs Abs 3.0 0.7 - 4.0 K/uL   Monocytes  Relative 8 %   Monocytes Absolute 0.9 0.1 - 1.0 K/uL   Eosinophils Relative 0 %   Eosinophils Absolute 0.0 0.0 - 0.5 K/uL   Basophils Relative 1 %   Basophils Absolute 0.1 0.0 - 0.1 K/uL   Immature Granulocytes 0 %   Abs Immature Granulocytes 0.02 0.00 - 0.07 K/uL  SARS Coronavirus 2 (CEPHEID - Performed in [place] hospital lab), Hosp Order     Status: None   Collection Time: 10/22/18  2:21 PM   Specimen: Nasopharyngeal Swab  Result Value Ref Range   SARS Coronavirus 2 NEGATIVE NEGATIVE  Albumin     Status: Abnormal   Collection Time: 10/22/18  6:51 PM  Result Value Ref Range   Albumin 3.4 (L) 3.5 - 5.0 g/dL  Hemoglobin A1c     Status: None   Collection Time: 10/22/18  6:51 PM  Result Value Ref Range   Hgb A1c MFr Bld 5.6 4.8 - 5.6 %   Mean Plasma Glucose 114.02 mg/dL  Phosphorus     Status: None   Collection Time: 10/22/18  6:51 PM  Result Value Ref Range   Phosphorus 3.9 2.5 - 4.6 mg/dL  Magnesium     Status: None   Collection Time: 10/22/18  6:51 PM  Result Value Ref Range   Magnesium 2.1 1.7 - 2.4 mg/dL  C-reactive protein     Status: Abnormal   Collection Time: 10/22/18  6:51 PM  Result Value Ref Range   CRP 2.9 (H) <1.0 mg/dL  Sedimentation rate     Status: Abnormal   Collection Time: 10/22/18  6:51 PM  Result Value Ref Range   Sed Rate 45 (H) 0 - 22 mm/hr  TSH     Status: Abnormal   Collection Time: 10/22/18  6:51 PM  Result Value Ref Range   TSH 0.072 (L) 0.350 - 4.500 uIU/mL  VITAMIN D 25 Hydroxy (Vit-D Deficiency,   Fractures)     Status: None   Collection Time: 10/22/18  6:51 PM  Result Value Ref Range   Vit D, 25-Hydroxy 63.8 30.0 - 100.0 ng/mL  Prealbumin     Status: None   Collection Time: 10/22/18  6:51 PM  Result Value Ref Range   Prealbumin 23.8 18 - 38 mg/dL  Type and screen [place]     Status: None   Collection Time: 10/22/18 10:35 PM  Result Value Ref Range   ABO/RH(D) O POS    Antibody Screen NEG    Sample Expiration       10/25/2018,2359 Performed at [facility], [place] [address], [place], [place] [address]   Protime-INR     Status: None   Collection Time: 10/22/18 10:43 PM  Result Value Ref Range   Prothrombin Time 13.8 11.4 - 15.2 seconds   INR 1.1 0.8 - 1.2  CBC     Status: Abnormal   Collection Time: 10/23/18  2:46 AM  Result Value Ref Range   WBC 10.2 4.0 - 10.5 K/uL   RBC 4.29 3.87 - 5.11 MIL/uL   Hemoglobin 11.4 (L) 12.0 - 15.0 g/dL   HCT 35.4 (L) 36.0 - 46.0 %   MCV 82.5 80.0 - 100.0 fL   MCH 26.6 26.0 - 34.0 pg   MCHC 32.2 30.0 - 36.0 g/dL   RDW 16.9 (H) 11.5 - 15.5 %   Platelets 518 (H) 150 - 400 K/uL   nRBC 0.0 0.0 - 0.2 %  Basic metabolic panel     Status: Abnormal   Collection Time: 10/23/18  2:46 AM  Result Value Ref Range   Sodium 135 135 - 145 mmol/L   Potassium 4.4 3.5 - 5.1 mmol/L   Chloride 104 98 - 111 mmol/L   CO2 19 (L) 22 - 32 mmol/L   Glucose, Bld 89 70 - 99 mg/dL   BUN 17 8 - 23 mg/dL   Creatinine, Ser 0.61 0.44 - 1.00 mg/dL   Calcium 9.0 8.9 - 10.3 mg/dL   GFR calc non Af Amer >60 >60 mL/min   GFR calc Af Amer >60 >60 mL/min   Anion gap 12 5 - 15  MRSA PCR Screening     Status: None   Collection Time: 10/23/18  6:07 AM   Specimen: Nasal Mucosa; Nasopharyngeal  Result Value Ref Range   MRSA by PCR NEGATIVE NEGATIVE     Medical history and chart was reviewed  Assessment/Plan: 63 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  She has gone on to persistent nonunion. Patient has poor healing potential and poor bone quality. Bone health labs ordered.  I do not have time to do the surgery today.  We will tentatively put the surgery on for tomorrow.  N.p.o. after midnight.  Risks and benefits were briefly discussed with the patient.  Patient will be on bedrest until we perform the surgery.   Shona Needles, MD [facility] [phone] (phone) [phone] (office) [email or web address]

## 2018-10-23 NOTE — Progress Notes (Addendum)
Initial Nutrition Assessment  DOCUMENTATION CODES:   Non-severe (moderate) malnutrition in context of chronic illness  INTERVENTION:  Provide Glucerna Shake po BID, each supplement provides 220 kcal and 10 grams of protein.  Encourage adequate PO intake.   NUTRITION DIAGNOSIS:   Increased nutrient needs related to chronic illness(COPD) as evidenced by estimated needs.  GOAL:   Patient will meet greater than or equal to 90% of their needs  MONITOR:   PO intake, Supplement acceptance, Skin, Weight trends, Labs, I & O's  REASON FOR ASSESSMENT:   Consult Assessment of nutrition requirement/status  ASSESSMENT:   63 y.o. female with medical history of COPD, GERD, tobacco abuse, arthritis, history of migraines, history of urinary tract infections, history of left femur and hip fracture and other comorbidities who presents with a chief complaint of left leg pain. Pt with left leg periprosthetic femur fracture that was recurrent.  Pt reports having a decreased appetite due to pain. Meal completion 50%. Pt reports eating well at home with frequent meals and snacks throughout the day. Pt additionally consumes Glucerna Shake supplements twice daily. Usual body weight ~105 lbs. Pt with no weight loss per weight records. Plans for surgery tomorrow for revision of left leg periprosthetic femur fracture. RD to order nutritional supplements to aid in caloric and protein needs as well as in healing.   Labs and medications reviewed.   NUTRITION - FOCUSED PHYSICAL EXAM:    Most Recent Value  Orbital Region  Unable to assess  Upper Arm Region  Moderate depletion  Thoracic and Lumbar Region  No depletion  Buccal Region  Unable to assess  Temple Region  Unable to assess  Clavicle Bone Region  Moderate depletion  Clavicle and Acromion Bone Region  Moderate depletion  Scapular Bone Region  Unable to assess  Dorsal Hand  Moderate depletion  Patellar Region  No depletion  Anterior Thigh Region   Mild depletion  Posterior Calf Region  No depletion  Edema (RD Assessment)  Mild  Hair  Reviewed  Eyes  Reviewed  Mouth  Reviewed  Skin  Reviewed  Nails  Reviewed       Diet Order:   Diet Order            Diet NPO time specified  Diet effective midnight        Diet Carb Modified Fluid consistency: Thin; Room service appropriate? Yes  Diet effective now              EDUCATION NEEDS:   Not appropriate for education at this time  Skin:  Skin Assessment: Reviewed RN Assessment  Last BM:  7/7  Height:   Ht Readings from Last 1 Encounters:  10/22/18 5\' 2"  (1.575 m)    Weight:   Wt Readings from Last 1 Encounters:  10/22/18 48.9 kg    Ideal Body Weight:  50 kg  BMI:  Body mass index is 19.72 kg/m.  Estimated Nutritional Needs:   Kcal:  1600-1800  Protein:  75-90 grams  Fluid:  1.6 - 1.8 L/day    Corrin Parker, MS, RD, LDN Pager # [phone] After hours/ weekend pager # [phone]

## 2018-10-23 NOTE — Progress Notes (Signed)
OT Cancellation Note  Patient Details Name: Melanie Parsons MRN: [id number] DOB: 1956/02/08   Cancelled Treatment:    Reason Eval/Treat Not Completed: Active bedrest order. Patient will be on bedrest until after revision surgery for her left femur due to persistent nonunion, tentatively scheduled for 7/9, per Orthopedics. Plan to reattempt after surgery once pt is off bed rest.   Tyrone Schimke, [place] Pager: [phone] Office: [phone]  10/23/2018, 8:35 AM

## 2018-10-23 NOTE — Plan of Care (Signed)

## 2018-10-23 NOTE — Consult Note (Signed)
Orthopaedic Trauma Service (OTS) Consult   Patient ID: Melanie Parsons MRN: [id number] DOB/AGE: [birth year] 64 y.o.  Reason for Consult:Left femoral nonunion Referring Physician: Dr. Tilden FossaElizabeth Rees, MD [facility]  HPI: Melanie Parsons is an 63 y.o. female who is being seen in consultation at the request of Dr. 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  The patient is known to me from a previous nonunion repair in September 2019.  She had a newly diagnosed diabetes as well as significant malnutrition subsequently had done well.  The last time I saw her was in December 2019.  She presented to the emergency room after having her leg give out from underneath her.  She has had some discomfort but no real pain.  She was just ambulating no elucidating trauma.  The patient states that she has better control of her diabetes.  Denies any major issues.  Having severe pain in her leg right now with obvious deformity.  Hurts with any attempted motion.  Denies any recent illnesses.  Past Medical History:  Diagnosis Date  . Anxiety   . Arthritis    "joints" (12/25/2017)  . GERD (gastroesophageal reflux disease)   . History of blood transfusion    "related RX I took; made my have my period q 2 wks"  . Migraine    "had one 3 days last week" (12/25/2017)  . Pneumonia    "walking pneumonia one times" (12/25/2017)  . Urinary tract infection 12/27/2017  . Wears glasses   . Wears partial dentures     Past Surgical History:  Procedure Laterality Date  . APPENDECTOMY  1990  . FEMUR FRACTURE SURGERY Left   . FRACTURE SURGERY    . HARDWARE REMOVAL Left 12/26/2017   Procedure: HARDWARE REMOVAL;  Surgeon: Roby LoftsHaddix, Kevin P, MD;  Location: [facility] OR;  Service: Orthopedics;  Laterality: Left;  removal of plate, screws and cables.   . JOINT REPLACEMENT    . MULTIPLE TOOTH EXTRACTIONS    . ORIF PERIPROSTHETIC FRACTURE Left 12/26/2017   Procedure: OPEN REDUCTION INTERNAL FIXATION (ORIF) PERIPROSTHETIC  FRACTURE;  Surgeon: Roby LoftsHaddix, Kevin P, MD;  Location: [facility] OR;  Service: Orthopedics;  Laterality: Left;  . SPLENECTOMY, TOTAL  1990  . TOTAL HIP ARTHROPLASTY Left 2016  . WRIST FRACTURE SURGERY Right    "put in titanium rod"    History reviewed. No pertinent family history.  Social History:  reports that she has been smoking cigarettes. She has a 15.00 pack-year smoking history. She has never used smokeless tobacco. She reports previous alcohol use. She reports that she does not use drugs.  Allergies:  Allergies  Allergen Reactions  . Penicillins Rash    Rash and itching    Medications:  No current facility-administered medications on file prior to encounter.    Current Outpatient Medications on File Prior to Encounter  Medication Sig Dispense Refill  . acetaminophen (TYLENOL) 500 MG tablet Take 1,000 mg by mouth as needed for mild pain.    Marland Kitchen. ALPRAZolam (XANAX) 1 MG tablet Take 1 mg by mouth 3 (three) times daily.    . calcium-vitamin D (OSCAL WITH D) 500-200 MG-UNIT tablet Take 1 tablet by mouth daily.    . Cyanocobalamin (VITAMIN B-12 PO) Take 1 tablet by mouth daily.    . mirtazapine (REMERON) 30 MG tablet Take 30 mg by mouth at bedtime.    . Multiple Vitamins-Minerals (MULTIVITAMIN ADULT PO) Take 1 tablet by mouth daily.    Marland Kitchen. omeprazole (PRILOSEC)  20 MG capsule Take 20 mg by mouth daily.    Marland Kitchen. oxymetazoline (AFRIN NODRIP SEVERE CONGEST) 0.05 % nasal spray Place 1 spray into both nostrils as needed for congestion.    . calcium citrate (CALCITRATE - DOSED IN MG ELEMENTAL CALCIUM) 950 MG tablet Take 1 tablet (200 mg of elemental calcium total) by mouth 2 (two) times daily. (Patient not taking: Reported on 10/22/2018) 30 tablet 0  . cholecalciferol 2000 units TABS Take 1 tablet (2,000 Units total) by mouth 2 (two) times daily. (Patient not taking: Reported on 10/22/2018) 60 tablet 0  . enoxaparin (LOVENOX) 40 MG/0.4ML injection Inject 0.4 mLs (40 mg total) into the skin daily. 20 mL 0  .  ferrous sulfate 325 (65 FE) MG tablet Take 1 tablet (325 mg total) by mouth 2 (two) times daily with a meal. (Patient not taking: Reported on 10/22/2018) 60 tablet 0  . HYDROcodone-acetaminophen (NORCO/VICODIN) 5-325 MG tablet Take 2 tablets by mouth every 6 (six) hours as needed for moderate pain. (Patient not taking: Reported on 10/22/2018) 30 tablet 0  . lactose free nutrition (BOOST PLUS) LIQD Take 237 mLs by mouth 3 (three) times daily with meals. (Patient not taking: Reported on 10/22/2018) 90 Can 0  . methocarbamol (ROBAXIN) 500 MG tablet Take 1 tablet (500 mg total) by mouth every 8 (eight) hours as needed for muscle spasms. (Patient not taking: Reported on 10/22/2018) 20 tablet 0  . Multiple Vitamin (MULTIVITAMIN WITH MINERALS) TABS tablet Take 1 tablet by mouth daily. (Patient not taking: Reported on 10/22/2018) 30 tablet 0  . polyethylene glycol (MIRALAX) packet Take 17 g by mouth daily as needed for mild constipation. (Patient not taking: Reported on 10/22/2018) 14 each 0  . vitamin C (VITAMIN C) 500 MG tablet Take 1 tablet (500 mg total) by mouth daily. (Patient not taking: Reported on 10/22/2018) 30 tablet 0    ROS: Constitutional: No fever or chills Vision: No changes in vision ENT: No difficulty swallowing CV: No chest pain Pulm: No SOB or wheezing GI: No nausea or vomiting GU: No urgency or inability to hold urine Skin: No poor wound healing Neurologic: No numbness or tingling Psychiatric: No depression or anxiety Heme: No bruising Allergic: No reaction to medications or food   Exam: Blood pressure 103/66, pulse 72, temperature 97.7 F (36.5 C), temperature source Oral, resp. rate 16, height 5\' 2"  (1.575 m), weight 48.9 kg, SpO2 98 %. General: No acute distress Orientation: Awake alert and oriented x3 Mood and Affect: Cooperative and pleasant Gait: Unable to assess due to her deformity of her leg and up pain Coordination and balance: Within normal limits  Left lower extremity:  Obvious varus deformity and bowing to the leg.  No skin tenting or skin compromise.  Lateral incision is well-healed without any signs of drainage or wound healing problems.  Active dorsiflexion plantarflexion of the foot and toes.  Intact sensation the dorsum and plantar aspect of the foot.  Warm well-perfused foot.  Compartments are soft and compressible.  Unable to tolerate any range of motion of the knee or hip.  Right lower extremity: Skin without lesions. No tenderness to palpation. Full painless ROM, full strength in each muscle groups without evidence of instability.   Medical Decision Making: Imaging: X-rays of the left femur and hip show previous lateral plate in place that is broken with a significant varus malalignment.  Nonunion site still has no healing and no signs of callus formation.  Labs:  Results for orders placed  or performed during the hospital encounter of 10/22/18 (from the past 24 hour(s))  Basic metabolic panel     Status: Abnormal   Collection Time: 10/22/18  2:15 PM  Result Value Ref Range   Sodium 136 135 - 145 mmol/L   Potassium 4.4 3.5 - 5.1 mmol/L   Chloride 103 98 - 111 mmol/L   CO2 23 22 - 32 mmol/L   Glucose, Bld 101 (H) 70 - 99 mg/dL   BUN 17 8 - 23 mg/dL   Creatinine, Ser 1.610.56 0.44 - 1.00 mg/dL   Calcium 9.3 8.9 - 09.610.3 mg/dL   GFR calc non Af Amer >60 >60 mL/min   GFR calc Af Amer >60 >60 mL/min   Anion gap 10 5 - 15  CBC with Differential     Status: Abnormal   Collection Time: 10/22/18  2:15 PM  Result Value Ref Range   WBC 11.7 (H) 4.0 - 10.5 K/uL   RBC 4.66 3.87 - 5.11 MIL/uL   Hemoglobin 12.5 12.0 - 15.0 g/dL   HCT 04.538.8 40.936.0 - 81.146.0 %   MCV 83.3 80.0 - 100.0 fL   MCH 26.8 26.0 - 34.0 pg   MCHC 32.2 30.0 - 36.0 g/dL   RDW 91.417.1 (H) 78.211.5 - 95.615.5 %   Platelets 556 (H) 150 - 400 K/uL   nRBC 0.0 0.0 - 0.2 %   Neutrophils Relative % 65 %   Neutro Abs 7.7 1.7 - 7.7 K/uL   Lymphocytes Relative 26 %   Lymphs Abs 3.0 0.7 - 4.0 K/uL   Monocytes  Relative 8 %   Monocytes Absolute 0.9 0.1 - 1.0 K/uL   Eosinophils Relative 0 %   Eosinophils Absolute 0.0 0.0 - 0.5 K/uL   Basophils Relative 1 %   Basophils Absolute 0.1 0.0 - 0.1 K/uL   Immature Granulocytes 0 %   Abs Immature Granulocytes 0.02 0.00 - 0.07 K/uL  SARS Coronavirus 2 (CEPHEID - Performed in [facility] lab), Hosp Order     Status: None   Collection Time: 10/22/18  2:21 PM   Specimen: Nasopharyngeal Swab  Result Value Ref Range   SARS Coronavirus 2 NEGATIVE NEGATIVE  Albumin     Status: Abnormal   Collection Time: 10/22/18  6:51 PM  Result Value Ref Range   Albumin 3.4 (L) 3.5 - 5.0 g/dL  Hemoglobin O1HA1c     Status: None   Collection Time: 10/22/18  6:51 PM  Result Value Ref Range   Hgb A1c MFr Bld 5.6 4.8 - 5.6 %   Mean Plasma Glucose 114.02 mg/dL  Phosphorus     Status: None   Collection Time: 10/22/18  6:51 PM  Result Value Ref Range   Phosphorus 3.9 2.5 - 4.6 mg/dL  Magnesium     Status: None   Collection Time: 10/22/18  6:51 PM  Result Value Ref Range   Magnesium 2.1 1.7 - 2.4 mg/dL  C-reactive protein     Status: Abnormal   Collection Time: 10/22/18  6:51 PM  Result Value Ref Range   CRP 2.9 (H) <1.0 mg/dL  Sedimentation rate     Status: Abnormal   Collection Time: 10/22/18  6:51 PM  Result Value Ref Range   Sed Rate 45 (H) 0 - 22 mm/hr  TSH     Status: Abnormal   Collection Time: 10/22/18  6:51 PM  Result Value Ref Range   TSH 0.072 (L) 0.350 - 4.500 uIU/mL  VITAMIN D 25 Hydroxy (Vit-D Deficiency,  Fractures)     Status: None   Collection Time: 10/22/18  6:51 PM  Result Value Ref Range   Vit D, 25-Hydroxy 63.8 30.0 - 100.0 ng/mL  Prealbumin     Status: None   Collection Time: 10/22/18  6:51 PM  Result Value Ref Range   Prealbumin 23.8 18 - 38 mg/dL  Type and screen [place]     Status: None   Collection Time: 10/22/18 10:35 PM  Result Value Ref Range   ABO/RH(D) O POS    Antibody Screen NEG    Sample Expiration       10/25/2018,2359 Performed at [facility], [place] [address], [place], [place] [address]   Protime-INR     Status: None   Collection Time: 10/22/18 10:43 PM  Result Value Ref Range   Prothrombin Time 13.8 11.4 - 15.2 seconds   INR 1.1 0.8 - 1.2  CBC     Status: Abnormal   Collection Time: 10/23/18  2:46 AM  Result Value Ref Range   WBC 10.2 4.0 - 10.5 K/uL   RBC 4.29 3.87 - 5.11 MIL/uL   Hemoglobin 11.4 (L) 12.0 - 15.0 g/dL   HCT 35.4 (L) 36.0 - 46.0 %   MCV 82.5 80.0 - 100.0 fL   MCH 26.6 26.0 - 34.0 pg   MCHC 32.2 30.0 - 36.0 g/dL   RDW 16.9 (H) 11.5 - 15.5 %   Platelets 518 (H) 150 - 400 K/uL   nRBC 0.0 0.0 - 0.2 %  Basic metabolic panel     Status: Abnormal   Collection Time: 10/23/18  2:46 AM  Result Value Ref Range   Sodium 135 135 - 145 mmol/L   Potassium 4.4 3.5 - 5.1 mmol/L   Chloride 104 98 - 111 mmol/L   CO2 19 (L) 22 - 32 mmol/L   Glucose, Bld 89 70 - 99 mg/dL   BUN 17 8 - 23 mg/dL   Creatinine, Ser 0.61 0.44 - 1.00 mg/dL   Calcium 9.0 8.9 - 10.3 mg/dL   GFR calc non Af Amer >60 >60 mL/min   GFR calc Af Amer >60 >60 mL/min   Anion gap 12 5 - 15  MRSA PCR Screening     Status: None   Collection Time: 10/23/18  6:07 AM   Specimen: Nasal Mucosa; Nasopharyngeal  Result Value Ref Range   MRSA by PCR NEGATIVE NEGATIVE     Medical history and chart was reviewed  Assessment/Plan: 63 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  She has gone on to persistent nonunion. Patient has poor healing potential and poor bone quality. Bone health labs ordered.  I do not have time to do the surgery today.  We will tentatively put the surgery on for tomorrow.  N.p.o. after midnight.  Risks and benefits were briefly discussed with the patient.  Patient will be on bedrest until we perform the surgery.   Shona Needles, MD [facility] [phone] (phone) [phone] (office) [email or web address]

## 2018-10-24 ENCOUNTER — Inpatient Hospital Stay (HOSPITAL_COMMUNITY): Payer: BC Managed Care – PPO

## 2018-10-24 ENCOUNTER — Encounter (HOSPITAL_COMMUNITY): Payer: Self-pay | Admitting: Orthopedic Surgery

## 2018-10-24 ENCOUNTER — Inpatient Hospital Stay (HOSPITAL_COMMUNITY): Payer: BC Managed Care – PPO | Admitting: Certified Registered"

## 2018-10-24 ENCOUNTER — Encounter (HOSPITAL_COMMUNITY)
Admission: EM | Disposition: A | Discharge status: Home Health | Payer: Self-pay | Source: Home / Self Care | Attending: Internal Medicine

## 2018-10-24 DIAGNOSIS — S72352A Displaced comminuted fracture of shaft of left femur, initial encounter for closed fracture: Secondary | ICD-10-CM

## 2018-10-24 HISTORY — PX: ORIF FEMUR FRACTURE: SHX2119

## 2018-10-24 LAB — BASIC METABOLIC PANEL
Anion gap: 7 (ref 5–15)
BUN: 11 mg/dL (ref 8–23)
CO2: 24 mmol/L (ref 22–32)
Calcium: 9.1 mg/dL (ref 8.9–10.3)
Chloride: 104 mmol/L (ref 98–111)
Creatinine, Ser: 0.55 mg/dL (ref 0.44–1.00)
GFR calc Af Amer: >60 mL/min (ref >60)
GFR calc non Af Amer: >60 mL/min (ref >60)
Glucose, Bld: 99 mg/dL (ref 70–99)
Potassium: 3.9 mmol/L (ref 3.5–5.1)
Sodium: 135 mmol/L (ref 135–145)

## 2018-10-24 LAB — GLUCOSE, CAPILLARY
Glucose-Capillary: 99 mg/dL (ref 70–99)
Glucose-Capillary: 149 mg/dL — ABNORMAL HIGH (ref 70–99)
Glucose-Capillary: 163 mg/dL — ABNORMAL HIGH (ref 70–99)

## 2018-10-24 LAB — CBC
HCT: 34 % — ABNORMAL LOW (ref 36.0–46.0)
Hemoglobin: 10.9 g/dL — ABNORMAL LOW (ref 12.0–15.0)
MCH: 26.5 pg (ref 26.0–34.0)
MCHC: 32.1 g/dL (ref 30.0–36.0)
MCV: 82.5 fL (ref 80.0–100.0)
Platelets: 482 10*3/uL — ABNORMAL HIGH (ref 150–400)
RBC: 4.12 MIL/uL (ref 3.87–5.11)
RDW: 16.8 % — ABNORMAL HIGH (ref 11.5–15.5)
WBC: 10.4 10*3/uL (ref 4.0–10.5)
nRBC: 0 % (ref 0.0–0.2)

## 2018-10-24 LAB — VITAMIN D 25 HYDROXY (VIT D DEFICIENCY, FRACTURES): Vit D, 25-Hydroxy: 28.1 ng/mL — ABNORMAL LOW (ref 30.0–100.0)

## 2018-10-24 SURGERY — OPEN REDUCTION INTERNAL FIXATION FEMORAL SHAFT FRACTURE
Anesthesia: General | Laterality: Left

## 2018-10-24 MED ORDER — SODIUM CHLORIDE 0.9 % IV SOLN
2.0000 g | INTRAVENOUS | Status: DC
  Administered 2018-10-24: 2 g via INTRAVENOUS
  Filled 2018-10-24: qty 20

## 2018-10-24 MED ORDER — 0.9 % SODIUM CHLORIDE (POUR BTL) OPTIME
TOPICAL | Status: DC | PRN
  Administered 2018-10-24 (×3): 1000 mL

## 2018-10-24 MED ORDER — ACETAMINOPHEN 500 MG PO TABS
500.0000 mg | ORAL_TABLET | Freq: Two times a day (BID) | ORAL | Status: DC
  Filled 2018-10-24 (×2): qty 1

## 2018-10-24 MED ORDER — DEXAMETHASONE SODIUM PHOSPHATE 10 MG/ML IJ SOLN
INTRAMUSCULAR | Status: DC | PRN
  Administered 2018-10-24: 5 mg via INTRAVENOUS

## 2018-10-24 MED ORDER — TOBRAMYCIN SULFATE 1.2 G IJ SOLR
INTRAMUSCULAR | Status: DC | PRN
  Administered 2018-10-24: 1.2 g via TOPICAL

## 2018-10-24 MED ORDER — ALBUMIN HUMAN 5 % IV SOLN
INTRAVENOUS | Status: DC | PRN
  Administered 2018-10-24: 13:00:00 via INTRAVENOUS

## 2018-10-24 MED ORDER — VANCOMYCIN HCL 1000 MG IV SOLR
INTRAVENOUS | Status: AC
  Filled 2018-10-24: qty 1000

## 2018-10-24 MED ORDER — VANCOMYCIN HCL IN DEXTROSE 1-5 GM/200ML-% IV SOLN
1000.0000 mg | INTRAVENOUS | Status: DC
  Administered 2018-10-24: 1000 mg via INTRAVENOUS
  Filled 2018-10-24: qty 200

## 2018-10-24 MED ORDER — PHENYLEPHRINE 40 MCG/ML (10ML) SYRINGE FOR IV PUSH (FOR BLOOD PRESSURE SUPPORT)
PREFILLED_SYRINGE | INTRAVENOUS | Status: DC | PRN
  Administered 2018-10-24: 200 ug via INTRAVENOUS
  Administered 2018-10-24 (×4): 80 ug via INTRAVENOUS
  Administered 2018-10-24: 120 ug via INTRAVENOUS
  Administered 2018-10-24: 80 ug via INTRAVENOUS

## 2018-10-24 MED ORDER — SODIUM CHLORIDE 0.9 % IV SOLN
INTRAVENOUS | Status: DC | PRN
  Administered 2018-10-24: 50 ug/min via INTRAVENOUS

## 2018-10-24 MED ORDER — CEFAZOLIN SODIUM-DEXTROSE 2-3 GM-%(50ML) IV SOLR
INTRAVENOUS | Status: DC | PRN
  Administered 2018-10-24: 2 g via INTRAVENOUS

## 2018-10-24 MED ORDER — BACITRACIN ZINC 500 UNIT/GM EX OINT
TOPICAL_OINTMENT | CUTANEOUS | Status: AC
  Filled 2018-10-24: qty 28.35

## 2018-10-24 MED ORDER — HYDROMORPHONE HCL 1 MG/ML IJ SOLN
INTRAMUSCULAR | Status: AC
  Filled 2018-10-24: qty 1

## 2018-10-24 MED ORDER — SUCCINYLCHOLINE CHLORIDE 200 MG/10ML IV SOSY
PREFILLED_SYRINGE | INTRAVENOUS | Status: AC
  Filled 2018-10-24: qty 10

## 2018-10-24 MED ORDER — ONDANSETRON HCL 4 MG/2ML IJ SOLN
INTRAMUSCULAR | Status: DC | PRN
  Administered 2018-10-24: 4 mg via INTRAVENOUS

## 2018-10-24 MED ORDER — PROPOFOL 10 MG/ML IV BOLUS
INTRAVENOUS | Status: DC | PRN
  Administered 2018-10-24: 80 mg via INTRAVENOUS
  Administered 2018-10-24: 20 mg via INTRAVENOUS

## 2018-10-24 MED ORDER — ONDANSETRON HCL 4 MG/2ML IJ SOLN
4.0000 mg | Freq: Four times a day (QID) | INTRAMUSCULAR | Status: DC | PRN
  Administered 2018-10-24 – 2018-10-25 (×2): 4 mg via INTRAVENOUS
  Filled 2018-10-24 (×2): qty 2

## 2018-10-24 MED ORDER — SUCCINYLCHOLINE CHLORIDE 200 MG/10ML IV SOSY
PREFILLED_SYRINGE | INTRAVENOUS | Status: DC | PRN
  Administered 2018-10-24: 80 mg via INTRAVENOUS

## 2018-10-24 MED ORDER — TOBRAMYCIN SULFATE 1.2 G IJ SOLR
INTRAMUSCULAR | Status: AC
  Filled 2018-10-24: qty 1.2

## 2018-10-24 MED ORDER — LIDOCAINE 2% (20 MG/ML) 5 ML SYRINGE
INTRAMUSCULAR | Status: AC
  Filled 2018-10-24: qty 5

## 2018-10-24 MED ORDER — EPHEDRINE SULFATE-NACL 50-0.9 MG/10ML-% IV SOSY
PREFILLED_SYRINGE | INTRAVENOUS | Status: DC | PRN
  Administered 2018-10-24 (×2): 5 mg via INTRAVENOUS
  Administered 2018-10-24: 10 mg via INTRAVENOUS

## 2018-10-24 MED ORDER — CEFAZOLIN SODIUM-DEXTROSE 2-4 GM/100ML-% IV SOLN
INTRAVENOUS | Status: AC
  Filled 2018-10-24: qty 100

## 2018-10-24 MED ORDER — ROCURONIUM BROMIDE 10 MG/ML (PF) SYRINGE
PREFILLED_SYRINGE | INTRAVENOUS | Status: AC
  Filled 2018-10-24: qty 10

## 2018-10-24 MED ORDER — MIDAZOLAM HCL 2 MG/2ML IJ SOLN
INTRAMUSCULAR | Status: DC | PRN
  Administered 2018-10-24: 2 mg via INTRAVENOUS

## 2018-10-24 MED ORDER — DEXAMETHASONE SODIUM PHOSPHATE 10 MG/ML IJ SOLN
INTRAMUSCULAR | Status: AC
  Filled 2018-10-24: qty 1

## 2018-10-24 MED ORDER — FENTANYL CITRATE (PF) 250 MCG/5ML IJ SOLN
INTRAMUSCULAR | Status: AC
  Filled 2018-10-24: qty 5

## 2018-10-24 MED ORDER — MIDAZOLAM HCL 2 MG/2ML IJ SOLN
INTRAMUSCULAR | Status: AC
  Filled 2018-10-24: qty 2

## 2018-10-24 MED ORDER — FENTANYL CITRATE (PF) 250 MCG/5ML IJ SOLN
INTRAMUSCULAR | Status: DC | PRN
  Administered 2018-10-24 (×2): 50 ug via INTRAVENOUS
  Administered 2018-10-24 (×2): 25 ug via INTRAVENOUS
  Administered 2018-10-24 (×2): 50 ug via INTRAVENOUS

## 2018-10-24 MED ORDER — HYDROMORPHONE HCL 1 MG/ML IJ SOLN
0.2500 mg | INTRAMUSCULAR | Status: DC | PRN
  Administered 2018-10-24 (×2): 0.5 mg via INTRAVENOUS

## 2018-10-24 MED ORDER — METHOCARBAMOL 500 MG PO TABS
500.0000 mg | ORAL_TABLET | Freq: Four times a day (QID) | ORAL | Status: DC | PRN
  Administered 2018-10-25 – 2018-10-26 (×6): 500 mg via ORAL
  Filled 2018-10-24 (×6): qty 1

## 2018-10-24 MED ORDER — MEPERIDINE HCL 25 MG/ML IJ SOLN: 6.2500 mg | INTRAMUSCULAR | Status: DC | PRN

## 2018-10-24 MED ORDER — LACTATED RINGERS IV SOLN
INTRAVENOUS | Status: DC
  Administered 2018-10-24 – 2018-10-25 (×3): via INTRAVENOUS
  Administered 2018-10-27: 10 mL/h via INTRAVENOUS

## 2018-10-24 MED ORDER — PROMETHAZINE HCL 25 MG/ML IJ SOLN: 6.2500 mg | INTRAMUSCULAR | Status: DC | PRN

## 2018-10-24 MED ORDER — SUGAMMADEX SODIUM 200 MG/2ML IV SOLN
INTRAVENOUS | Status: DC | PRN
  Administered 2018-10-24: 200 mg via INTRAVENOUS

## 2018-10-24 MED ORDER — ROCURONIUM BROMIDE 10 MG/ML (PF) SYRINGE
PREFILLED_SYRINGE | INTRAVENOUS | Status: DC | PRN
  Administered 2018-10-24 (×3): 20 mg via INTRAVENOUS
  Administered 2018-10-24: 40 mg via INTRAVENOUS
  Administered 2018-10-24: 20 mg via INTRAVENOUS

## 2018-10-24 MED ORDER — ONDANSETRON HCL 4 MG/2ML IJ SOLN
INTRAMUSCULAR | Status: AC
  Filled 2018-10-24: qty 2

## 2018-10-24 MED ORDER — PROPOFOL 10 MG/ML IV BOLUS
INTRAVENOUS | Status: AC
  Filled 2018-10-24: qty 20

## 2018-10-24 MED ORDER — VANCOMYCIN HCL 1000 MG IV SOLR
INTRAVENOUS | Status: DC | PRN
  Administered 2018-10-24: 1000 mg via TOPICAL

## 2018-10-24 MED ORDER — LIDOCAINE 2% (20 MG/ML) 5 ML SYRINGE
INTRAMUSCULAR | Status: DC | PRN
  Administered 2018-10-24: 60 mg via INTRAVENOUS

## 2018-10-24 SURGICAL SUPPLY — 72 items
BIT DRILL QC 3.3X195 (BIT) ×2 IMPLANT
BNDG ELASTIC 6X10 VLCR STRL LF (GAUZE/BANDAGES/DRESSINGS) ×2 IMPLANT
BONE CANC CHIPS 20CC PCAN1/4 (Bone Implant) ×3 IMPLANT
BRUSH SCRUB SURG 4.25 DISP (MISCELLANEOUS) ×6 IMPLANT
CABLE CERLAGE W/CRIMP 1.8 (Cable) ×2 IMPLANT
CABLE CERLAGE W/CRIMP 1.8MM (Cable) ×2 IMPLANT
CABLE READY CERCLAGE W/CRIP (Trauma Fixation) ×4 IMPLANT
CAP LOCK NCB (Cap) ×12 IMPLANT
CHIPS CANC BONE 20CC PCAN1/4 (Bone Implant) ×1 IMPLANT
CHLORAPREP W/TINT 26 (MISCELLANEOUS) ×6 IMPLANT
COVER SURGICAL LIGHT HANDLE (MISCELLANEOUS) ×6 IMPLANT
COVER WAND RF STERILE (DRAPES) ×3 IMPLANT
DRAPE C-ARM 42X72 X-RAY (DRAPES) ×3 IMPLANT
DRAPE C-ARMOR (DRAPES) ×3 IMPLANT
DRAPE IMP U-DRAPE 54X76 (DRAPES) ×3 IMPLANT
DRAPE U-SHAPE 47X51 STRL (DRAPES) ×3 IMPLANT
DRSG ADAPTIC 3X8 NADH LF (GAUZE/BANDAGES/DRESSINGS) ×3 IMPLANT
DRSG PAD ABDOMINAL 8X10 ST (GAUZE/BANDAGES/DRESSINGS) ×12 IMPLANT
ELECT REM PT RETURN 9FT ADLT (ELECTROSURGICAL) ×3
ELECTRODE REM PT RTRN 9FT ADLT (ELECTROSURGICAL) ×1 IMPLANT
EVACUATOR 1/8 PVC DRAIN (DRAIN) IMPLANT
GAUZE SPONGE 4X4 12PLY STRL LF (GAUZE/BANDAGES/DRESSINGS) ×2 IMPLANT
GLOVE BIO SURGEON STRL SZ 6.5 (GLOVE) ×6 IMPLANT
GLOVE BIO SURGEON STRL SZ7.5 (GLOVE) ×12 IMPLANT
GLOVE BIO SURGEONS STRL SZ 6.5 (GLOVE) ×3
GLOVE BIOGEL PI IND STRL 6.5 (GLOVE) ×1 IMPLANT
GLOVE BIOGEL PI IND STRL 7.5 (GLOVE) ×1 IMPLANT
GLOVE BIOGEL PI INDICATOR 6.5 (GLOVE) ×2
GLOVE BIOGEL PI INDICATOR 7.5 (GLOVE) ×2
GOWN STRL REUS W/ TWL LRG LVL3 (GOWN DISPOSABLE) ×2 IMPLANT
GOWN STRL REUS W/TWL LRG LVL3 (GOWN DISPOSABLE) ×4
GRAFT BNE CANC CHIPS 1-8 20CC (Bone Implant) IMPLANT
GRAFT BNE STRUT 20X200 CRT FEM (Bone Implant) IMPLANT
K-WIRE 2.0 (WIRE) ×2
K-WIRE FXSTD 280X2XNS SS (WIRE) ×1
KIT BASIN OR (CUSTOM PROCEDURE TRAY) ×3 IMPLANT
KIT INFUSE LRG II (Orthopedic Implant) ×2 IMPLANT
KIT TURNOVER KIT B (KITS) ×3 IMPLANT
KWIRE FXSTD 280X2XNS SS (WIRE) IMPLANT
MANIFOLD NEPTUNE II (INSTRUMENTS) ×3 IMPLANT
NS IRRIG 1000ML POUR BTL (IV SOLUTION) ×3 IMPLANT
PACK TOTAL JOINT (CUSTOM PROCEDURE TRAY) ×3 IMPLANT
PAD ABD 8X10 STRL (GAUZE/BANDAGES/DRESSINGS) ×2 IMPLANT
PAD ARMBOARD 7.5X6 YLW CONV (MISCELLANEOUS) ×6 IMPLANT
PADDING CAST COTTON 6X4 STRL (CAST SUPPLIES) ×2 IMPLANT
PLATE LEFT PROX FEMUR 15H (Plate) ×2 IMPLANT
SCREW DIST FEM NCB P-64 (Screw) ×6 IMPLANT
SCREW NCB 4.0 28MM (Screw) ×2 IMPLANT
SCREW NCB 4.0MX30M (Screw) ×2 IMPLANT
SCREW NCB 4.0MX42M (Screw) ×2 IMPLANT
SCREW NCB 4.0MX48M (Screw) ×2 IMPLANT
SCREW NCB 4.0MX55M (Screw) ×2 IMPLANT
SCREW NCB 4.0X26MM (Screw) ×4 IMPLANT
SCREW NCB 4.0X36MM (Screw) ×6 IMPLANT
SCREW NCB 4.0X40MM (Screw) ×4 IMPLANT
SPONGE LAP 18X18 RF (DISPOSABLE) ×3 IMPLANT
STAPLER VISISTAT 35W (STAPLE) ×3 IMPLANT
SUT ETHILON 3 0 FSL (SUTURE) ×2 IMPLANT
SUT MNCRL AB 3-0 PS2 18 (SUTURE) ×3 IMPLANT
SUT MON AB 2-0 CT1 36 (SUTURE) ×3 IMPLANT
SUT PROLENE 0 CT (SUTURE) IMPLANT
SUT VIC AB 0 CT1 27 (SUTURE) ×4
SUT VIC AB 0 CT1 27XBRD ANBCTR (SUTURE) ×1 IMPLANT
SUT VIC AB 1 CT1 27 (SUTURE) ×8
SUT VIC AB 1 CT1 27XBRD ANTBC (SUTURE) ×4 IMPLANT
SUT VIC AB 2-0 CT1 27 (SUTURE) ×2
SUT VIC AB 2-0 CT1 TAPERPNT 27 (SUTURE) ×1 IMPLANT
TISSUE GRFT STRUT 20X200 STRL (Bone Implant) ×3 IMPLANT
TOWEL GREEN STERILE (TOWEL DISPOSABLE) ×6 IMPLANT
TOWEL GREEN STERILE FF (TOWEL DISPOSABLE) ×3 IMPLANT
TRAY FOLEY MTR SLVR 16FR STAT (SET/KITS/TRAYS/PACK) IMPLANT
WATER STERILE IRR 1000ML POUR (IV SOLUTION) ×9 IMPLANT

## 2018-10-24 NOTE — Anesthesia Postprocedure Evaluation (Signed)
Anesthesia Post Note  Patient: Melanie Parsons  Procedure(s) Performed: OPEN REDUCTION INTERNAL FIXATION FEMORAL NONUNION (Left )     Patient location during evaluation: PACU Anesthesia Type: General Level of consciousness: sedated and patient cooperative Pain management: pain level controlled Vital Signs Assessment: post-procedure vital signs reviewed and stable Respiratory status: spontaneous breathing Cardiovascular status: stable Anesthetic complications: no    Last Vitals:  Vitals:   10/24/18 1530 10/24/18 1556  BP: 115/70 118/68  Pulse: (!) 101 89  Resp: (!) 27 16  Temp: (!) 36.2 C (!) 36.3 C  SpO2: 96% 100%    Last Pain:  Vitals:   10/24/18 1556  TempSrc: Oral  PainSc:                  [name]

## 2018-10-24 NOTE — Anesthesia Procedure Notes (Signed)
Procedure Name: Intubation Date/Time: 10/24/2018 11:25 AM Performed by: Teressa Lower., CRNA Pre-anesthesia Checklist: Patient identified, Emergency Drugs available, Suction available and Patient being monitored Patient Re-evaluated:Patient Re-evaluated prior to induction Oxygen Delivery Method: Circle system utilized Preoxygenation: Pre-oxygenation with 100% oxygen Induction Type: IV induction Ventilation: Mask ventilation without difficulty Laryngoscope Size: Mac and 3 Grade View: Grade I Tube type: Oral Tube size: 7.0 mm Number of attempts: 1 Airway Equipment and Method: Stylet Placement Confirmation: ETT inserted through vocal cords under direct vision,  positive ETCO2 and breath sounds checked- equal and bilateral Secured at: 21 cm Tube secured with: Tape Dental Injury: Teeth and Oropharynx as per pre-operative assessment

## 2018-10-24 NOTE — Anesthesia Preprocedure Evaluation (Signed)
Anesthesia Evaluation  Patient identified by MRN, date of birth, ID band Patient awake    Reviewed: Allergy & Precautions, NPO status , Patient's Chart, lab work & pertinent test results  Airway Mallampati: II  TM Distance: >3 FB Neck ROM: Full    Dental no notable dental hx. (+) Dental Advidsory Given, Partial Upper   Pulmonary COPD, Current Smoker,    Pulmonary exam normal breath sounds clear to auscultation       Cardiovascular hypertension, Normal cardiovascular exam Rhythm:Regular Rate:Normal     Neuro/Psych  Headaches, PSYCHIATRIC DISORDERS Anxiety Depression negative neurological ROS     GI/Hepatic Neg liver ROS, GERD  ,  Endo/Other  diabetes  Renal/GU negative Renal ROS     Musculoskeletal  (+) Arthritis ,   Abdominal   Peds  Hematology negative hematology ROS (+) anemia ,   Anesthesia Other Findings   Reproductive/Obstetrics negative OB ROS                             Anesthesia Physical  Anesthesia Plan  ASA: III  Anesthesia Plan: General   Post-op Pain Management: GA combined w/ Regional for post-op pain   Induction: Intravenous  PONV Risk Score and Plan: 3 and Ondansetron, Dexamethasone and Treatment may vary due to age or medical condition  Airway Management Planned: Oral ETT  Additional Equipment: None  Intra-op Plan:   Post-operative Plan: Extubation in OR  Informed Consent: I have reviewed the patients History and Physical, chart, labs and discussed the procedure including the risks, benefits and alternatives for the proposed anesthesia with the patient or authorized representative who has indicated his/her understanding and acceptance.     Dental advisory given  Plan Discussed with: CRNA  Anesthesia Plan Comments:         Anesthesia Quick Evaluation

## 2018-10-24 NOTE — Transfer of Care (Signed)
Immediate Anesthesia Transfer of Care Note  Patient: Brieanna Nau  Procedure(s) Performed: OPEN REDUCTION INTERNAL FIXATION FEMORAL NONUNION (Left )  Patient Location: PACU  Anesthesia Type:General  Level of Consciousness: awake, alert  and oriented  Airway & Oxygen Therapy: Patient Spontanous Breathing and Patient connected to face mask oxygen  Post-op Assessment: Report given to RN and Post -op Vital signs reviewed and stable  Post vital signs: Reviewed and stable  Last Vitals:  Vitals Value Taken Time  BP 111/71 10/24/18 1445  Temp    Pulse 104 10/24/18 1446  Resp 28 10/24/18 1446  SpO2 100 % 10/24/18 1446  Vitals shown include unvalidated device data.  Last Pain:  Vitals:   10/24/18 0825  TempSrc: Oral  PainSc:       Patients Stated Pain Goal: 2 (50/27/74 1287)  Complications: No apparent anesthesia complications

## 2018-10-24 NOTE — Progress Notes (Signed)
OT Cancellation Note  Patient Details Name: Melanie Parsons MRN: [id number] DOB: [birth year]   Cancelled Treatment:    Reason Eval/Treat Not Completed: Other (comment)(pt in surgery). Will f/u for OT evaluation as appropriate.  Darrol Jump OTR/L [place] [phone] 10/24/2018, 9:34 AM

## 2018-10-24 NOTE — Progress Notes (Signed)
Pharmacy Antibiotic Note  Melanie Parsons is a 63 y.o. female admitted on 10/22/2018 with left femur fracture.  Pharmacy has been consulted for vancomycin dosing for wound infection. S/P repair of left femur fracture and removal of hardware on 7/9.  Patient is afebrile, WBC is trending down 11.7 >>10.4. Scr and urine output stable.  Plan: Vancomycin 1000 mg IV every 24 hours.  Calculated with Scr 0.8, TBW 48.9 kg, Vd 0.72 L/kg, for a calculated AUC of 485.5.  Height: 5' 2.01" (157.5 cm) Weight: 107 lb 12.9 oz (48.9 kg) IBW/kg (Calculated) : 50.12  Temp (24hrs), Avg:97.8 F (36.6 C), Min:97 F (36.1 C), Max:98.7 F (37.1 C)  Recent Labs  Lab 10/22/18 1415 10/23/18 0246 10/24/18 0230  WBC 11.7* 10.2 10.4  CREATININE 0.56 0.61 0.55    Estimated Creatinine Clearance: 55.6 mL/min (by C-G formula based on SCr of 0.55 mg/dL).    Allergies  Allergen Reactions  . Penicillins Rash    Rash and itching    Antimicrobials this admission: Vancomycin 7/9>> Ceftriaxone 7/9>>  Microbiology results:  7/8 MRSA PCR negative 7/9 urine cx: E. Coli 7/9 bone cx: pending  Thank you for allowing pharmacy to be a part of this patient's care.  Berenice Bouton, PharmD PGY1 Pharmacy Resident Office phone: [phone]  10/24/2018 4:33 PM

## 2018-10-24 NOTE — Interval H&P Note (Signed)
History and Physical Interval Note:  10/24/2018 10:05 AM  Melanie Parsons  has presented today for surgery, with the diagnosis of Left femoral nonunion.  The various methods of treatment have been discussed with the patient and family. After consideration of risks, benefits and other options for treatment, the patient has consented to  Procedure(s): OPEN REDUCTION INTERNAL FIXATION FEMORAL NONUNION (Left) as a surgical intervention.  The patient's history has been reviewed, patient examined, no change in status, stable for surgery.  I have reviewed the patient's chart and labs.  Questions were answered to the patient's satisfaction.     Lennette Bihari P Haddix

## 2018-10-24 NOTE — Progress Notes (Signed)
PROGRESS NOTE    Melanie Parsons  NLZ:[id number] DOB: [birth year] DOA: 10/22/2018 PCP: Enid Skeens., MD    Brief Narrative:   Melanie Parsons is a 63 y.o. female with medical history significant for but not limited to depression and anxiety, GERD, tobacco abuse, arthritis, history of migraines, history of urinary tract infections, history of left femur and hip fracture and other comorbidities who presents with a chief complaint of left leg pain.  Patient states that she was ambulating yesterday from her room to the bathroom and felt a sudden onset of sharp pain.  States that she thought she was going to fall so she grabbed onto the wall and asked her daughter to bring in a rolling chair and set on rolling chair.  Patient got up a few times and transfer to a bedside commode since then but today was unable to ambulate and put pressure on her leg.  She also notes that her leg started swelling around the hip area which was new since overnight.  She denies any chest pain, lightheadedness or dizziness.  No nausea or vomiting.  Still smokes sometimes.  No other concerns or plans at this time and [facility] was asked to admit this patient for a left leg periprosthetic femur fracture that was recurrent and Orthopedic Surgery requested the patient be sent to [facility] for further evaluation.  In the ED the patient had basic blood work done as well as imaging of her hip and femur.  She was also given pain control   Assessment & Plan:   Active Problems:   COPD (chronic obstructive pulmonary disease) (HCC)   Generalized anxiety disorder   Depression   New onset type 2 diabetes mellitus (HCC)   Iron deficiency anemia   Peri-prosthetic subtrochanteric femur fracture   Femur fracture (HCC)   Left periprosthetic femur fracture Patient with acute onset left lower extremity pain while ambulating yesterday, denies fall.  Left femur x-ray notable for recurrent fracture in the midshaft of the left  femur in the area of prior fracture with additional fracture of the fixation sideplate. --Orthopedics following, Dr. Doreatha Martin --s/p repair of left femur fracture and removal of hardware 7/9 --bone culture pending --Vanc and ceftrixone; pharmacy consulted for dosing monitoring of vancomycin --PT/OT evaluation pending  Ecoli UTI --ceftriaxone  GERD: Continue PPI  Iron deficiency anemia Hemoglobin stable 12.5 on admission.     --Hgb 12.5-->11.4-->10.9 --Continue home ferrous sulfate 325 mg p.o. twice daily. --continue to monitor H/H closely in the setting of femur fracture with planned surgical intervention.  Type 2 diabetes mellitus Hemoglobin A1c 5.6.  Diet controlled. --Continue to monitor CBGs and insulin sliding scale prn  Hx COPD Not in acute exacerbation.  No inhaler therapy outpatient.  Oxygenating well on room air. --nebs prn  Depression/anxiety Continue home alprazolam 1 mg p.o. 3 times daily, mirtazapine 30 mg p.o. nightly   DVT prophylaxis: Lovenox Code Status: Full code Family Communication: None Disposition Plan: Continue inpatient, pending PT/OT evaluation, likely SNF versus home health   Consultants:   Orthopedics, Dr. Doreatha Martin  Procedures:  s/p repair of left femur fracture and removal of hardware on 7/9  Antimicrobials:   Vancomycin 7/9>>  Ceftriaxone 7/9>>   Subjective: Patient seen and examined at bedside, just returned from PACU following surgical intervention of her femur fracture. No complaints at this time.  Denies headache, no fever/chills/night sweats, no nausea/vomiting/diarrhea, no chest pain, no palpitations, no shortness of breath, no abdominal pain, no cough/congestion.  No acute events overnight per nursing staff.  Objective: Vitals:   10/23/18 2357 10/24/18 0430 10/24/18 0825 10/24/18 0908  BP: 108/60 (!) 114/48 108/69   Pulse: 73 86 78   Resp: 16 16 16    Temp: 97.8 F (36.6 C) 98.5 F (36.9 C) 98.7 F (37.1 C)   TempSrc: Oral  Oral Oral   SpO2: 95% 97% 96%   Weight:    48.9 kg  Height:    5' 2.01" (1.575 m)    Intake/Output Summary (Last 24 hours) at 10/24/2018 1442 Last data filed at 10/24/2018 1358 Gross per 24 hour  Intake 1490 ml  Output 950 ml  Net 540 ml   Filed Weights   10/22/18 1254 10/22/18 2111 10/24/18 0908  Weight: 47.6 kg 48.9 kg 48.9 kg    Examination:  General exam: Appears calm, mildly discomfort when moves lower extremity Respiratory system: Clear to auscultation. Respiratory effort normal. Cardiovascular system: S1 & S2 heard, RRR. No JVD, murmurs, rubs, gallops or clicks. No pedal edema. Gastrointestinal system: Abdomen is nondistended, soft and nontender. No organomegaly or masses felt. Normal bowel sounds heard. Central nervous system: Alert and oriented. No focal neurological deficits. Extremities: No peripheral edema, neurovascular intact, dressing noted, clean/dry/intact Skin: No rashes, lesions or ulcers Psychiatry: Judgement and insight appear normal. Mood & affect appropriate.     Data Reviewed: I have personally reviewed following labs and imaging studies  CBC: Recent Labs  Lab 10/22/18 1415 10/23/18 0246 10/24/18 0230  WBC 11.7* 10.2 10.4  NEUTROABS 7.7  --   --   HGB 12.5 11.4* 10.9*  HCT 38.8 35.4* 34.0*  MCV 83.3 82.5 82.5  PLT 556* 518* 482*   Basic Metabolic Panel: Recent Labs  Lab 10/22/18 1415 10/22/18 1851 10/23/18 0246 10/24/18 0230  NA 136  --  135 135  K 4.4  --  4.4 3.9  CL 103  --  104 104  CO2 23  --  19* 24  GLUCOSE 101*  --  89 99  BUN 17  --  17 11  CREATININE 0.56  --  0.61 0.55  CALCIUM 9.3  --  9.0 9.1  MG  --  2.1  --   --   PHOS  --  3.9  --   --    GFR: Estimated Creatinine Clearance: 55.6 mL/min (by C-G formula based on SCr of 0.55 mg/dL). Liver Function Tests: Recent Labs  Lab 10/22/18 1851  ALBUMIN 3.4*   No results for input(s): LIPASE, AMYLASE in the last 168 hours. No results for input(s): AMMONIA in the last 168  hours. Coagulation Profile: Recent Labs  Lab 10/22/18 2243  INR 1.1   Cardiac Enzymes: No results for input(s): CKTOTAL, CKMB, CKMBINDEX, TROPONINI in the last 168 hours. BNP (last 3 results) No results for input(s): PROBNP in the last 8760 hours. HbA1C: Recent Labs    10/22/18 1851  HGBA1C 5.6   CBG: Recent Labs  Lab 10/23/18 0815 10/23/18 1131 10/23/18 1736 10/23/18 2005 10/24/18 0806  GLUCAP 80 120* 96 138* 99   Lipid Profile: No results for input(s): CHOL, HDL, LDLCALC, TRIG, CHOLHDL, LDLDIRECT in the last 72 hours. Thyroid Function Tests: Recent Labs    10/22/18 1851  TSH 0.072*   Anemia Panel: No results for input(s): VITAMINB12, FOLATE, FERRITIN, TIBC, IRON, RETICCTPCT in the last 72 hours. Sepsis Labs: No results for input(s): PROCALCITON, LATICACIDVEN in the last 168 hours.  Recent Results (from the past 240 hour(s))  SARS Coronavirus 2 (  CEPHEID - Performed in [facility] hospital lab), Hosp Order     Status: None   Collection Time: 10/22/18  2:21 PM   Specimen: Nasopharyngeal Swab  Result Value Ref Range Status   SARS Coronavirus 2 NEGATIVE NEGATIVE Final    Comment: (NOTE) If result is NEGATIVE SARS-CoV-2 target nucleic acids are NOT DETECTED. The SARS-CoV-2 RNA is generally detectable in upper and lower  respiratory specimens during the acute phase of infection. The lowest  concentration of SARS-CoV-2 viral copies this assay can detect is 250  copies / mL. A negative result does not preclude SARS-CoV-2 infection  and should not be used as the sole basis for treatment or other  patient management decisions.  A negative result may occur with  improper specimen collection / handling, submission of specimen other  than nasopharyngeal swab, presence of viral mutation(s) within the  areas targeted by this assay, and inadequate number of viral copies  (<250 copies / mL). A negative result must be combined with clinical  observations, patient history,  and epidemiological information. If result is POSITIVE SARS-CoV-2 target nucleic acids are DETECTED. The SARS-CoV-2 RNA is generally detectable in upper and lower  respiratory specimens dur ing the acute phase of infection.  Positive  results are indicative of active infection with SARS-CoV-2.  Clinical  correlation with patient history and other diagnostic information is  necessary to determine patient infection status.  Positive results do  not rule out bacterial infection or co-infection with other viruses. If result is PRESUMPTIVE POSTIVE SARS-CoV-2 nucleic acids MAY BE PRESENT.   A presumptive positive result was obtained on the submitted specimen  and confirmed on repeat testing.  While 2019 novel coronavirus  (SARS-CoV-2) nucleic acids may be present in the submitted sample  additional confirmatory testing may be necessary for epidemiological  and / or clinical management purposes  to differentiate between  SARS-CoV-2 and other Sarbecovirus currently known to infect humans.  If clinically indicated additional testing with an alternate test  methodology [phone]) is advised. The SARS-CoV-2 RNA is generally  detectable in upper and lower respiratory sp ecimens during the acute  phase of infection. The expected result is Negative. Fact Sheet for Patients:  BoilerBrush.com.cy Fact Sheet for Healthcare Providers: https://pope.com/ This test is not yet approved or cleared by the [place] FDA and has been authorized for detection and/or diagnosis of SARS-CoV-2 by FDA under an Emergency Use Authorization (EUA).  This EUA will remain in effect (meaning this test can be used) for the duration of the COVID-19 declaration under Section 564(b)(1) of the Act, 21 U.S.C. section 360bbb-3(b)(1), unless the authorization is terminated or revoked sooner. Performed at [facility], [address], [place], [place] [address]    MRSA PCR Screening     Status: None   Collection Time: 10/23/18  6:07 AM   Specimen: Nasal Mucosa; Nasopharyngeal  Result Value Ref Range Status   MRSA by PCR NEGATIVE NEGATIVE Final    Comment:        The GeneXpert MRSA Assay (FDA approved for NASAL specimens only), is one component of a comprehensive MRSA colonization surveillance program. It is not intended to diagnose MRSA infection nor to guide or monitor treatment for MRSA infections. Performed at [facility], [address], [place], [place] [address]   Urine culture     Status: Abnormal (Preliminary result)   Collection Time: 10/23/18  4:44 PM   Specimen: Urine, Random  Result Value Ref Range Status  Specimen Description URINE, RANDOM  Final   Special Requests NONE  Final   Culture (A)  Final    >=100,000 COLONIES/mL ESCHERICHIA COLI SUSCEPTIBILITIES TO FOLLOW Performed at [facility], [address] [place], [place] [address]    Report Status PENDING  Incomplete         Radiology Studies: Chest Portable 1 View  Result Date: 10/22/2018 CLINICAL DATA:  Preop evaluation for upcoming surgery on left femur EXAM: PORTABLE CHEST 1 VIEW COMPARISON:  02/28/2017 FINDINGS: Cardiac shadow is within normal limits. Aortic calcifications are noted. Lungs are hyperinflated without focal infiltrate or sizable effusion. No acute bony abnormality is noted. IMPRESSION: COPD without acute abnormality. Electronically Signed   By: Alcide CleverMark  Lukens M.D.   On: 10/22/2018 21:55   Dg C-arm 1-60 Min  Result Date: 10/24/2018 CLINICAL DATA:  Surgery: Left femur ORIF Fluoro time: 3 minutes 33 seconds RSTO: AW Location: [place] OR EXAM: LEFT FEMUR 2 VIEWS; DG C-ARM 61-120 MIN COMPARISON:  10/22/2018 FINDINGS: Twelve images are submitted, showing mid femur fracture below prosthetic prior to removal of fractured plate and following replacement followup LATERAL screw plate and cerclage wires. IMPRESSION: ORIF of LEFT femur with  replacement of the LATERAL screw plate. Electronically Signed   By: Norva PavlovElizabeth  Brown M.D.   On: 10/24/2018 14:27   Dg Hip Unilat W Or Wo Pelvis 2-3 Views Left  Result Date: 10/22/2018 CLINICAL DATA:  Pain following walking with obvious femoral deformity, initial encounter EXAM: DG HIP (WITH OR WITHOUT PELVIS) 2-3V LEFT COMPARISON:  12/26/2017 FINDINGS: There is recurrent fracture in the area of previous midshaft femoral fracture with a fracture line through the previously placed fixation side plate. The hip prosthesis appears intact. Medial angulation of the distal fracture fragment is noted. The femoral component is not dislocated. The visualized pelvis appears within normal limits. IMPRESSION: Recurrent fracture in a similar location to that seen on the prior exam with subsequent fracture of the fixation sideplate. Electronically Signed   By: Alcide CleverMark  Lukens M.D.   On: 10/22/2018 15:13   Dg Femur Min 2 Views Left  Result Date: 10/24/2018 CLINICAL DATA:  Surgery: Left femur ORIF Fluoro time: 3 minutes 33 seconds RSTO: AW Location: [place] OR EXAM: LEFT FEMUR 2 VIEWS; DG C-ARM 61-120 MIN COMPARISON:  10/22/2018 FINDINGS: Twelve images are submitted, showing mid femur fracture below prosthetic prior to removal of fractured plate and following replacement followup LATERAL screw plate and cerclage wires. IMPRESSION: ORIF of LEFT femur with replacement of the LATERAL screw plate. Electronically Signed   By: Norva PavlovElizabeth  Brown M.D.   On: 10/24/2018 14:27   Dg Femur Min 2 Views Left  Result Date: 10/22/2018 CLINICAL DATA:  Pain while walking with obvious deformity EXAM: LEFT FEMUR 2 VIEWS COMPARISON:  12/26/2017 FINDINGS: There is a recurrent fracture in the midshaft of the left femur adjacent to the tip of the hip prosthesis. The previously placed fixation sideplate demonstrates fracture at this level. Approximately 1/2 bone width displacement is noted as well. IMPRESSION: Recurrent fracture in the midshaft of the  left femur in the area of prior fracture with additional fracture of the fixation side plate. Electronically Signed   By: Alcide CleverMark  Lukens M.D.   On: 10/22/2018 15:14        Scheduled Meds: . [MAR Hold] ALPRAZolam  1 mg Oral TID  . [MAR Hold] calcium citrate  200 mg of elemental calcium Oral BID  . [MAR Hold] calcium-vitamin D  1 tablet Oral Daily  . [  MAR Hold] cholecalciferol  2,000 Units Oral BID  . [MAR Hold] docusate sodium  100 mg Oral BID  . [MAR Hold] enoxaparin  40 mg Subcutaneous Q24H  . [MAR Hold] feeding supplement (GLUCERNA SHAKE)  237 mL Oral BID BM  . [MAR Hold] ferrous sulfate  325 mg Oral BID WC  . [MAR Hold] insulin aspart  0-5 Units Subcutaneous QHS  . [MAR Hold] insulin aspart  0-9 Units Subcutaneous TID WC  . [MAR Hold] mirtazapine  30 mg Oral QHS  . [MAR Hold] multivitamin with minerals  1 tablet Oral Daily  . [MAR Hold] pantoprazole  40 mg Oral Daily  . [MAR Hold] ascorbic acid  500 mg Oral Daily   Continuous Infusions: . ceFAZolin    . lactated ringers 10 mL/hr at 10/24/18 0923     LOS: 2 days    Time spent: 29 minutes    Alvira PhilipsEric J [place], DO [facility] Pager [phone]  If 7PM-7AM, please contact night-coverage [email or web address] Password [email or web address] 10/24/2018, 2:42 PM

## 2018-10-24 NOTE — Progress Notes (Signed)
PT Cancellation Note  Patient Details Name: Somara Frymire MRN: [id number] DOB: 04-20-1955   Cancelled Treatment:    Reason Eval/Treat Not Completed: Patient not medically ready, remains on bedrest awaiting surgery. Will follow-up for PT evaluation as appropriate.  Mabeline Caras, PT, DPT Acute Rehabilitation Services  Pager [phone] Office [place] 10/24/2018, 7:56 AM

## 2018-10-24 NOTE — Op Note (Signed)
Orthopaedic Surgery Operative Note (CSN: [id number] ) Date of Surgery: 10/24/2018  Admit Date: 10/22/2018   Diagnoses: Pre-Op Diagnoses: Left persistent femoral nonunion Left failed femur fixation  Post-Op Diagnosis: Same  Procedures: 1. CPT 29937-JIRCVE of left femoral nonunion 2. CPT 20680-Removal of hardware left femur  Surgeons : Primary: Shona Needles, MD  Assistant: Patrecia Pace, PA-C  Location: OR 3   Anesthesia:General  Antibiotics: Ancef 2g preop   Tourniquet time:None  Estimated Blood LFYB:017 mL  Complications:None   Specimens: ID Type Source Tests Collected by Time Destination  1 : LEFT FEMUR NONUNION Tissue Bone SURGICAL PATHOLOGY Shona Needles, MD 10/24/2018 1151      Implants: Implant Name Type Inv. Item Serial No. Manufacturer Lot No. LRB No. Used Action  KIT INFUSE LARGE II - [id number] Orthopedic Implant KIT INFUSE LARGE II  MED[employer] [facility] DANEK [id number] Left 1 Implanted  TISSUE GRFT STRUT 20X200 STRL - [id number] Bone Implant TISSUE GRFT STRUT 20X200 STRL [id number] [employer] [place] [employer]  Left 1 Implanted  CABLE READY CERCLAGE W/CRIP - [id number] Trauma Fixation CABLE READY CERCLAGE W/CRIP  ZIMMER [employer]ON(ORTH,TRAU,BIO,SG) [id number] Left 1 Implanted  CABLE CERLAGE W/CRIMP 1.8MM - [id number] Cable CABLE CERLAGE W/CRIMP 1.8MM  ZIM[employer]ON(ORTH,TRAU,BIO,SG) [id number] Left 1 Implanted  BONE [facility] CHIPS 20CC - [id number] Bone Implant BONE [facility] CHIPS 20CC [id number] [employer] [place] TISSUE [employer]  Left 1 Implanted  CABLE CERLAGE W/CRIMP 1.8MM - [id number] Cable CABLE CERLAGE W/CRIMP 1.8MM  ZIMMER RECON(ORTH,TRAU,BIO,SG) [id number] Left 1 Implanted  CABLE READY CERCLAGE W/CRIP - [id number] Trauma Fixation CABLE READY CERCLAGE W/CRIP  ZIMMER RECON(ORTH,TRAU,BIO,SG) [id number] Left 1 Implanted  PLATE LEFT PROX FEMUR 15H - [id number] Plate PLATE LEFT PROX FEMUR 15H  ZIMMER [employer]ON(ORTH,TRAU,BIO,SG)  Left 1 Implanted  CAP LOCK NCB - [id number] Cap CAP LOCK NCB   ZIMMER [employer]ON(ORTH,TRAU,BIO,SG)  Left 6 Implanted  SCREW DIST FEM NCB P-64 - [id number] Screw SCREW DIST FEM NCB P-64  ZIMMER [employer]ON(ORTH,TRAU,BIO,SG)  Left 3 Implanted  SCREW NCB 4.0X26MM - [id number] Screw SCREW NCB 4.0X26MM  ZIMMER [employer]ON(ORTH,TRAU,BIO,SG)  Left 2 Implanted  SCREW NCB 4.0 28MM - [id number] Screw SCREW NCB 4.0 28MM  ZIM[employer]ON(ORTH,TRAU,BIO,SG)  Left 1 Implanted  SCREW NCB 4.0MX30M - [id number] Screw SCREW NCB 4.0MX30M  ZIMMER RECON(ORTH,TRAU,BIO,SG)  Left 1 Implanted  SCREW NCB 4.0X36MM - [id number] Screw SCREW NCB 4.0X36MM  ZIMMER RECON(ORTH,TRAU,BIO,SG)  Left 3 Implanted  SCREW NCB 4.0X40MM - [id number] Screw SCREW NCB 4.0X40MM  ZIMMER RECON(ORTH,TRAU,BIO,SG)  Left 2 Implanted  SCREW NCB 4.0MX42M - [id number] Screw SCREW NCB 4.0MX42M  ZIMMER RECON(ORTH,TRAU,BIO,SG)  Left 1 Implanted  SCREW NCB 4.0ZE09Q - [id number] Screw SCREW NCB 4.3FH54T  ZIMMER RECON(ORTH,TRAU,BIO,SG)  Left 1 Implanted  SCREW NCB 4.6YB63S - [id number] Screw SCREW NCB 4.8TL57W  ZIMMER RECON(ORTH,TRAU,BIO,SG)  Left 1 Implanted     Indications for Surgery: 63 year old female who had a history of previous nonunion repair by myself in September 2019.  Earlier this week her leg gave out from underneath her with immediate deformity.  X-ray showed failed fixation with persistent nonunion.  Patient had a history of malnutrition, diabetes and tobacco use, along with vitamin D deficiency.  She is improved her vitamin D numbers.  She has improved her hemoglobin A1c control.  She is still smoking about a pack a day.  However I felt that proceeding with repair of her nonunion would be most appropriate.  I did discuss with her that her nonunion and bone is very devascularized and she is at high risk of developing another nonunion.  However without a repair I do not feel that she will be able to walk again.  Risks and benefits were discussed.  Risks included but not limited to bleeding, infection, malunion, consistent nonunion, nerve and  blood vessel injury, DVT, periprosthetic fracture, even possibility loss of life.  Operative Findings: 1.  Persistent nonunion with failed fixation.  Removal of previous hardware. 2.  Debridement of nonunion site with culture sent from the specimen. 3.  Repair of nonunion using [employer] proximal NCB locking plate with a reinforcement using a allograft strut and 4 cerclage cables around the femur strut and plate. 4.  Placement of infuse (bone morphogenic protein) and a crushed cancellus allograft in the nonunion site  Procedure: The patient was identified in the preoperative holding area. Consent was confirmed with the patient and their family and all questions were answered. The operative extremity was marked after confirmation with the patient. she was then brought back to the operating room by our anesthesia colleagues.  She was placed under general anesthetic and carefully transferred over to a radiolucent flat top table.  A bump was placed under the operative hip. The operative extremity was then prepped and draped in usual sterile fashion. A preoperative timeout was performed to verify the patient, the procedure, and the extremity. Preoperative antibiotics were dosed.  Fluoroscopic images were obtained to show the failed fixation persistent nonunion.  Through her previous incision I incised and carried down through skin and subcutaneous tissue until identified the IT band.  I split the IT band in line with my incision and reflected the vastus lateralis off the plate.  I was able to successfully remove the proximal and distal portion of the previous plate without any difficulty.  I then performed subperiosteal dissection along the anterior cortex of the femur.  I delivered the nonunion ends and debrided the sclerotic bone tips as well as used a curette to debride the medullary canal of both the proximal and distal segments.  I sent this off for culture to make sure that there was no infection  causing the nonunion.  I then subsequently turned my attention to the plating portion of the procedure.  I aligned the nonunion appropriately and got cortical contact along the medial side and even placed the femur and a slight touch of valgus.  I chose a 15 hole proximal NCB plate and reduced it to the proximal segment.  Screws were placed bicortically around the prosthesis to hold the reduction.  The same was performed distally and bicortical screws were placed to hold the reduction.  At this point I cleared the anterior surface of the femur.  At the nonunion site I placed a large Infuse sponge with bone morphogenic protein in the area as she did not have any good amount of autograft and clearly her bone biology was not functioning right.  I use crushed cancellus in combination with the infuse to pack the nonunion site.  Once I had the bone graft in place I then used a femoral cortical strut to place it anteriorly.  I then used 4 cables 2 proximal and 2 distal to hold the strut and the plate in position.  Excellent fixation was obtained.  I then placed another 2 bicortical screws distally and a another 3 screws proximally obtaining the fixation around the stem.  Locking caps were placed on all screws but the distal screw to prevent a stress riser.  The cables that were over the other screws did not have locking caps  placed on them.  Final fluoroscopic images were obtained.  The incision was copiously irrigated.  A gram of vancomycin powder 1.2 g of tobramycin powder were placed into the incision.  The IT band with closed with a 0 Vicryl.  The skin was closed with 2-0 Monocryl and 3-0 nylon.  Sterile dressing was placed.  The patient was awoken from anesthesia and taken the PACU in stable condition.  Post Op Plan/Instructions: Patient will be nonweightbearing to left lower extremity.  She will receive vancomycin and ceftriaxone until her cultures return.  She will receive Lovenox for DVT prophylaxis.  She  will mobilize with physical therapy.  I was present and performed the entire surgery.  Patrecia Pace, PA-C did assist me throughout the case. An assistant was necessary given the difficulty in approach, maintenance of reduction and ability to instrument the fracture.   Katha Hamming, MD [facility]

## 2018-10-25 ENCOUNTER — Encounter (HOSPITAL_COMMUNITY): Payer: Self-pay | Admitting: Student

## 2018-10-25 LAB — CBC
HCT: 30 % — ABNORMAL LOW (ref 36.0–46.0)
Hemoglobin: 9.7 g/dL — ABNORMAL LOW (ref 12.0–15.0)
MCH: 26.3 pg (ref 26.0–34.0)
MCHC: 32.3 g/dL (ref 30.0–36.0)
MCV: 81.3 fL (ref 80.0–100.0)
Platelets: 441 10*3/uL — ABNORMAL HIGH (ref 150–400)
RBC: 3.69 MIL/uL — ABNORMAL LOW (ref 3.87–5.11)
RDW: 16.4 % — ABNORMAL HIGH (ref 11.5–15.5)
WBC: 13 10*3/uL — ABNORMAL HIGH (ref 4.0–10.5)
nRBC: 0 % (ref 0.0–0.2)

## 2018-10-25 LAB — GLUCOSE, CAPILLARY
Glucose-Capillary: 97 mg/dL (ref 70–99)
Glucose-Capillary: 101 mg/dL — ABNORMAL HIGH (ref 70–99)
Glucose-Capillary: 118 mg/dL — ABNORMAL HIGH (ref 70–99)
Glucose-Capillary: 169 mg/dL — ABNORMAL HIGH (ref 70–99)

## 2018-10-25 LAB — BASIC METABOLIC PANEL
Anion gap: 10 (ref 5–15)
BUN: 10 mg/dL (ref 8–23)
CO2: 23 mmol/L (ref 22–32)
Calcium: 8.6 mg/dL — ABNORMAL LOW (ref 8.9–10.3)
Chloride: 101 mmol/L (ref 98–111)
Creatinine, Ser: 0.58 mg/dL (ref 0.44–1.00)
GFR calc Af Amer: >60 mL/min (ref >60)
GFR calc non Af Amer: >60 mL/min (ref >60)
Glucose, Bld: 114 mg/dL — ABNORMAL HIGH (ref 70–99)
Potassium: 4 mmol/L (ref 3.5–5.1)
Sodium: 134 mmol/L — ABNORMAL LOW (ref 135–145)

## 2018-10-25 LAB — URINE CULTURE: Culture: >=100000 — AB

## 2018-10-25 LAB — MAGNESIUM: Magnesium: 1.6 mg/dL — ABNORMAL LOW (ref 1.7–2.4)

## 2018-10-25 MED ORDER — OXYCODONE HCL 5 MG PO TABS: 5.0000 mg | ORAL_TABLET | ORAL | Status: DC | PRN

## 2018-10-25 MED ORDER — OXYCODONE HCL 5 MG PO TABS
5.0000 mg | ORAL_TABLET | ORAL | Status: DC | PRN
  Administered 2018-10-25 – 2018-10-28 (×13): 5 mg via ORAL
  Filled 2018-10-25 (×13): qty 1

## 2018-10-25 MED ORDER — CEPHALEXIN 500 MG PO CAPS
500.0000 mg | ORAL_CAPSULE | Freq: Two times a day (BID) | ORAL | Status: AC
  Administered 2018-10-25 – 2018-10-27 (×6): 500 mg via ORAL
  Filled 2018-10-25 (×6): qty 1

## 2018-10-25 MED ORDER — ACETAMINOPHEN 325 MG PO TABS
650.0000 mg | ORAL_TABLET | Freq: Four times a day (QID) | ORAL | Status: DC
  Administered 2018-10-25 – 2018-10-28 (×12): 650 mg via ORAL
  Filled 2018-10-25 (×12): qty 2

## 2018-10-25 NOTE — Progress Notes (Signed)
PROGRESS NOTE    Melanie Parsons  GNF:[id number] DOB: 1955-11-02 DOA: 10/22/2018 PCP: [name], MD    Brief Narrative:   Melanie Parsons is a 63 y.o. female with medical history significant for but not limited to depression and anxiety, GERD, tobacco abuse, arthritis, history of migraines, history of urinary tract infections, history of left femur and hip fracture and other comorbidities who presents with a chief complaint of left leg pain.  Patient states that she was ambulating yesterday from her room to the bathroom and felt a sudden onset of sharp pain.  States that she thought she was going to fall so she grabbed onto the wall and asked her daughter to bring in a rolling chair and set on rolling chair.  Patient got up a few times and transfer to a bedside commode since then but today was unable to ambulate and put pressure on her leg.  She also notes that her leg started swelling around the hip area which was new since overnight.  She denies any chest pain, lightheadedness or dizziness.  No nausea or vomiting.  Still smokes sometimes.  No other concerns or plans at this time and [facility] was asked to admit this patient for a left leg periprosthetic femur fracture that was recurrent and Orthopedic Surgery requested the patient be sent to [facility] for further evaluation.  In the ED the patient had basic blood work done as well as imaging of her hip and femur.  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  Left femur x-ray notable for recurrent fracture in the midshaft of the left femur in the area of prior fracture with additional fracture of the fixation sideplate. --Orthopedics following, Dr. Jena Gauss --s/p repair of left femur fracture and removal of hardware 7/9 --Lovenox for DVT prophylaxis --NWB LLE --bone culture ordered but was not sent off from OR --Vanc and ceftrixone; orthopedics to discontinue this morning --Pain control with oxycodone 5 mg p.o. q4h prn, fentanyl 25 mg IV prn and Robaxin 500 mg q6h prn --PT/OT evaluation pending --Will need orthopedic f/u 2 weeks following discharge for wound check, suture removal, repeat x-rays  Ecoli UTI --ceftriaxone, will now change to Keflex 500 mg p.o. twice daily for 3 days in accordance with urine culture susceptibilities  GERD: Continue PPI  Iron deficiency anemia Hemoglobin stable 12.5 on admission.     --Hgb 12.5-->11.4-->10.9-->9.7 --Continue home ferrous sulfate 325 mg p.o. twice daily. --continue to monitor H/H closely in the setting of femur fracture with planned surgical intervention.  Type 2 diabetes mellitus Hemoglobin A1c 5.6.  Diet controlled. --Continue to monitor CBGs and insulin sliding scale prn  Hx COPD Not in acute exacerbation.  No inhaler therapy outpatient.  Oxygenating well on room air. --nebs prn  Depression/anxiety Continue home alprazolam 1 mg p.o. 3 times daily, mirtazapine 30 mg p.o. nightly   DVT prophylaxis: Lovenox Code Status: Full code Family Communication: None Disposition Plan: Continue inpatient, pending PT/OT evaluation, likely SNF versus home health  Consultants:   Orthopedics, Dr. Doreatha Martin  Procedures:  s/p repair of left femur fracture and removal of hardware on 7/9  Antimicrobials:   Vancomycin 7/9>>  Ceftriaxone 7/9>>   Subjective: Patient seen and examined at bedside, just returned from PACU following surgical intervention of her femur fracture.  No complaints at this time.  Denies headache, no fever/chills/night sweats, no nausea/vomiting/diarrhea, no chest pain, no palpitations, no shortness of breath, no abdominal pain, no cough/congestion.  No acute events overnight per nursing staff.  Objective: Vitals:   10/24/18 1938 10/24/18 2313 10/25/18 0319 10/25/18 0758  BP: 106/61 (!) 94/59 (!) 88/66 (!) 84/58  Pulse: 82 78 92 98  Resp: 15 17 15 16   Temp: 98.7 F (37.1 C) 98.7 F (37.1 C) 98.3 F (36.8 C) 99.5 F (37.5 C)  TempSrc: Oral Oral Oral Oral  SpO2: 98% 100% 96% 100%  Weight:      Height:        Intake/Output Summary (Last 24 hours) at 10/25/2018 1121 Last data filed at 10/25/2018 0802 Gross per 24 hour  Intake 2050 ml  Output 650 ml  Net 1400 ml   Filed Weights   10/22/18 1254 10/22/18 2111 10/24/18 0908  Weight: 47.6 kg 48.9 kg 48.9 kg    Examination:  General exam: Appears calm, mildly discomfort when moves lower extremity Respiratory system: Clear to auscultation. Respiratory effort normal. Cardiovascular system: S1 & S2 heard, RRR. No JVD, murmurs, rubs, gallops or clicks. No pedal edema. Gastrointestinal system: Abdomen is nondistended, soft and nontender. No organomegaly or masses felt. Normal bowel sounds heard. Central nervous system: Alert and oriented. No focal neurological deficits. Extremities: No peripheral edema, neurovascular intact, dressing noted, clean/dry/intact Skin: No rashes, lesions or ulcers Psychiatry: Judgement and insight appear normal. Mood & affect appropriate.     Data Reviewed: I have personally reviewed following labs and imaging studies  CBC: Recent Labs  Lab 10/22/18 1415 10/23/18 0246 10/24/18 0230 10/25/18 0522  WBC 11.7* 10.2 10.4 13.0*  NEUTROABS 7.7  --   --   --   HGB 12.5 11.4* 10.9* 9.7*  HCT 38.8 35.4* 34.0* 30.0*  MCV 83.3 82.5 82.5 81.3  PLT 556* 518* 482* 921*   Basic Metabolic Panel: Recent Labs  Lab 10/22/18 1415 10/22/18 1851 10/23/18 0246  10/24/18 0230 10/25/18 0522  NA 136  --  135 135 134*  K 4.4  --  4.4 3.9 4.0  CL 103  --  104 104 101  CO2 23  --  19* 24 23  GLUCOSE 101*  --  89 99 114*  BUN 17  --  17 11 10   CREATININE 0.56  --  0.61 0.55 0.58  CALCIUM 9.3  --  9.0 9.1 8.6*  MG  --  2.1  --   --  1.6*  PHOS  --  3.9  --   --   --    GFR: Estimated Creatinine Clearance: 55.6 mL/min (by C-G formula based on SCr of 0.58 mg/dL). Liver Function Tests: Recent Labs  Lab 10/22/18 1851  ALBUMIN 3.4*   No results for input(s): LIPASE, AMYLASE in the last 168 hours. No results for input(s): AMMONIA in the last 168 hours. Coagulation Profile: Recent Labs  Lab 10/22/18 2243  INR 1.1   Cardiac Enzymes: No results for input(s): CKTOTAL, CKMB, CKMBINDEX, TROPONINI in the last 168 hours. BNP (last 3 results) No results for input(s): PROBNP in the last 8760 hours. HbA1C: Recent Labs    10/22/18 1851  HGBA1C 5.6   CBG: Recent Labs  Lab 10/23/18 2005 10/24/18 0806 10/24/18 1632 10/24/18 1941 10/25/18 0800  GLUCAP 138* 99 149* 163* 97   Lipid Profile: No results for input(s): CHOL, HDL, LDLCALC, TRIG, CHOLHDL, LDLDIRECT in the last 72 hours. Thyroid Function Tests: Recent Labs    10/22/18 1851  TSH 0.072*   Anemia Panel: No results for input(s): VITAMINB12, FOLATE, FERRITIN, TIBC, IRON, RETICCTPCT in the last 72 hours. Sepsis Labs: No results for input(s): PROCALCITON, LATICACIDVEN in the last 168 hours.  Recent Results (from the past 240 hour(s))  SARS Coronavirus 2 (CEPHEID - Performed in [facility] hospital lab), Hosp Order     Status: None   Collection Time: 10/22/18  2:21 PM   Specimen: Nasopharyngeal Swab  Result Value Ref Range Status   SARS Coronavirus 2 NEGATIVE NEGATIVE Final    Comment: (NOTE) If result is NEGATIVE SARS-CoV-2 target nucleic acids are NOT DETECTED. The SARS-CoV-2 RNA is generally detectable in upper and lower  respiratory specimens during the acute phase of  infection. The lowest  concentration of SARS-CoV-2 viral copies this assay can detect is 250  copies / mL. A negative result does not preclude SARS-CoV-2 infection  and should not be used as the sole basis for treatment or other  patient management decisions.  A negative result may occur with  improper specimen collection / handling, submission of specimen other  than nasopharyngeal swab, presence of viral mutation(s) within the  areas targeted by this assay, and inadequate number of viral copies  (<250 copies / mL). A negative result must be combined with clinical  observations, patient history, and epidemiological information. If result is POSITIVE SARS-CoV-2 target nucleic acids are DETECTED. The SARS-CoV-2 RNA is generally detectable in upper and lower  respiratory specimens dur ing the acute phase of infection.  Positive  results are indicative of active infection with SARS-CoV-2.  Clinical  correlation with patient history and other diagnostic information is  necessary to determine patient infection status.  Positive results do  not rule out bacterial infection or co-infection with other viruses. If result is PRESUMPTIVE POSTIVE SARS-CoV-2 nucleic acids MAY BE PRESENT.   A presumptive positive result was obtained on the submitted specimen  and confirmed on repeat testing.  While 2019 novel coronavirus  (SARS-CoV-2) nucleic acids may be present in the submitted sample  additional confirmatory testing may be necessary for epidemiological  and / or clinical management purposes  to differentiate between  SARS-CoV-2 and other Sarbecovirus currently known to infect humans.  If clinically indicated additional testing with an alternate test  methodology [id number]) is advised. The SARS-CoV-2 RNA is generally  detectable in upper and lower respiratory sp ecimens during the acute  phase of infection. The expected result is Negative. Fact Sheet for Patients:   BoilerBrush.com.cyhttps://www.fda.gov/media/136312/download Fact Sheet for Healthcare Providers: https://pope.com/https://www.fda.gov/media/136313/download This test is not yet approved or cleared by the [place] FDA and has been authorized for detection and/or diagnosis of SARS-CoV-2 by FDA under an Emergency Use Authorization (EUA).  This EUA will remain in effect (meaning this test can be used) for the duration of the COVID-19 declaration under Section 564(b)(1) of the Act, 21 U.S.C. section 360bbb-3(b)(1), unless the authorization is terminated or revoked sooner. Performed at [facility], [address] [place], [place] [address]   MRSA PCR Screening     Status: None   Collection Time: 10/23/18  6:07 AM   Specimen: Nasal Mucosa; Nasopharyngeal  Result Value Ref Range  Status   MRSA by PCR NEGATIVE NEGATIVE Final    Comment:        The GeneXpert MRSA Assay (FDA approved for NASAL specimens only), is one component of a comprehensive MRSA colonization surveillance program. It is not intended to diagnose MRSA infection nor to guide or monitor treatment for MRSA infections. Performed at [facility], [address] [place], [place] [address]   Urine culture     Status: Abnormal   Collection Time: 10/23/18  4:44 PM   Specimen: Urine, Random  Result Value Ref Range Status   Specimen Description URINE, RANDOM  Final   Special Requests   Final    NONE Performed at [facility], [address] [place], [place] [address]    Culture >=100,000 COLONIES/mL ESCHERICHIA COLI (A)  Final   Report Status 10/25/2018 FINAL  Final   Organism ID, Bacteria ESCHERICHIA COLI (A)  Final      Susceptibility   Escherichia coli - MIC*    AMPICILLIN >=32 RESISTANT Resistant     CEFAZOLIN <=4 SENSITIVE Sensitive     CEFTRIAXONE <=1 SENSITIVE Sensitive     CIPROFLOXACIN <=0.25 SENSITIVE Sensitive     GENTAMICIN <=1 SENSITIVE Sensitive     IMIPENEM <=0.25 SENSITIVE Sensitive      NITROFURANTOIN <=16 SENSITIVE Sensitive     TRIMETH/SULFA <=20 SENSITIVE Sensitive     AMPICILLIN/SULBACTAM >=32 RESISTANT Resistant     PIP/TAZO <=4 SENSITIVE Sensitive     Extended ESBL NEGATIVE Sensitive     * >=100,000 COLONIES/mL ESCHERICHIA COLI         Radiology Studies: Dg C-arm 1-60 Min  Result Date: 10/24/2018 CLINICAL DATA:  Surgery: Left femur ORIF Fluoro time: 3 minutes 33 seconds RSTO: AW Location: [place] OR EXAM: LEFT FEMUR 2 VIEWS; DG C-ARM 61-120 MIN COMPARISON:  10/22/2018 FINDINGS: Twelve images are submitted, showing mid femur fracture below prosthetic prior to removal of fractured plate and following replacement followup LATERAL screw plate and cerclage wires. IMPRESSION: ORIF of LEFT femur with replacement of the LATERAL screw plate. Electronically Signed   By: Norva PavlovElizabeth  Brown M.D.   On: 10/24/2018 14:27   Dg Femur Min 2 Views Left  Result Date: 10/24/2018 CLINICAL DATA:  Surgery: Left femur ORIF Fluoro time: 3 minutes 33 seconds RSTO: AW Location:  OR EXAM: LEFT FEMUR 2 VIEWS; DG C-ARM 61-120 MIN COMPARISON:  10/22/2018 FINDINGS: Twelve images are submitted, showing mid femur fracture below prosthetic prior to removal of fractured plate and following replacement followup LATERAL screw plate and cerclage wires. IMPRESSION: ORIF of LEFT femur with replacement of the LATERAL screw plate. Electronically Signed   By: Norva PavlovElizabeth  Brown M.D.   On: 10/24/2018 14:27   Dg Femur Port Min 2 Views Left  Result Date: 10/24/2018 CLINICAL DATA:  Status post revision fracture fixation scratch the status post revision of a periprosthetic fracture of left femur today. EXAM: LEFT FEMUR PORTABLE 2 VIEWS COMPARISON:  Plain films left femur 10/22/2018. FINDINGS: Bipolar right hip hemiarthroplasty is again seen. Previously seen fixation plate has been removed and a new fixation plate and screws is in place along the lateral aspect of the left femur for fixation of a fracture at the  tip of the femoral stem. Bone graft material is noted. Hardware is intact. Position and alignment are near anatomic. No acute abnormality. Bones are osteopenic. IMPRESSION: Status post revision fixation of a periprosthetic fracture of the left femur. No acute abnormality Electronically Signed  By: Drusilla Kannerhomas  Dalessio M.D.   On: 10/24/2018 16:08        Scheduled Meds: . acetaminophen  650 mg Oral Q6H  . ALPRAZolam  1 mg Oral TID  . calcium citrate  200 mg of elemental calcium Oral BID  . calcium-vitamin D  1 tablet Oral Daily  . cholecalciferol  2,000 Units Oral BID  . docusate sodium  100 mg Oral BID  . enoxaparin  40 mg Subcutaneous Q24H  . feeding supplement (GLUCERNA SHAKE)  237 mL Oral BID BM  . ferrous sulfate  325 mg Oral BID WC  . insulin aspart  0-5 Units Subcutaneous QHS  . insulin aspart  0-9 Units Subcutaneous TID WC  . mirtazapine  30 mg Oral QHS  . multivitamin with minerals  1 tablet Oral Daily  . pantoprazole  40 mg Oral Daily  . ascorbic acid  500 mg Oral Daily   Continuous Infusions: . lactated ringers Stopped (10/24/18 1447)     LOS: 3 days    Time spent: 29 minutes    Alvira PhilipsEric J [place], DO [facility] Pager [phone]  If 7PM-7AM, please contact night-coverage [email or web address] Password [email or web address] 10/25/2018, 11:21 AM

## 2018-10-25 NOTE — Evaluation (Signed)
Physical Therapy Evaluation Patient Details Name: Melanie Parsons MRN: [id number] DOB: October 06, 1955 Today's Date: 10/25/2018   History of Present Illness  Pt is a 63 y.o. 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 S/p repair 7/9. 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    Clinical Impression  Pt presents with an overall decrease in functional mobility secondary to above. PTA, pt indep with ambulation, lives with family. Today, pt requiring maxA+2 for mobility, only able to tolerate sitting EOB for a few minutes; limited by significant LLE pain. Discussed recommendation for SNF-level therapies before return home; pt adamantly declining, reports family will be able to provide necessary assist. Will require a wheelchair.     Follow Up Recommendations SNF;Supervision/Assistance - 24 hour(adamantly declined SNF, will return home with HHPT and 24/7 family)    Equipment Recommendations  Wheelchair (measurements PT);Wheelchair cushion (measurements PT)    Recommendations for Other Services       Precautions / Restrictions Precautions Precautions: Fall Restrictions Weight Bearing Restrictions: Yes LLE Weight Bearing: Non weight bearing      Mobility  Bed Mobility Overal bed mobility: Needs Assistance Bed Mobility: Supine to Sit;Sit to Supine     Supine to sit: Max assist;+2 for physical assistance Sit to supine: Max assist;+2 for physical assistance   General bed mobility comments: MaxA+2 to assist BLEs to EOB and trunk elevation; max, multimodal cues throughout to stay on task and for sequencing. Very limited by pain  Transfers                 General transfer comment: Declined due to pain  Ambulation/Gait                Stairs            Wheelchair Mobility    Modified Rankin  (Stroke Patients Only)       Balance Overall balance assessment: Needs assistance   Sitting balance-Leahy Scale: Fair                                       Pertinent Vitals/Pain Pain Assessment: Faces Faces Pain Scale: Hurts whole lot Pain Location: LLE Pain Descriptors / Indicators: Guarding;Grimacing;Moaning Pain Intervention(s): Monitored during session;Repositioned;Ice applied    Home Living Family/patient expects to be discharged to:: Private residence Living Arrangements: Spouse/significant other;Children Available Help at Discharge: Family;Available PRN/intermittently Type of Home: House Home Access: Ramped entrance     Home Layout: One level Home Equipment: Walker - 2 wheels;Bedside commode;Hand held shower head;Shower seat;Grab bars - tub/shower;Other (comment)(lift chair, hospital bed that [facility]) Additional Comments: Daughter at home until 4-5p, husband home after that    Prior Function Level of Independence: Independent         Comments: Ambulatory without DME     Hand Dominance        Extremity/Trunk Assessment   Upper Extremity Assessment Upper Extremity Assessment: RUE deficits/detail;Defer to OT evaluation RUE Deficits / Details: reports chronic R shoulder pain and hesitant to move; strong grip    Lower Extremity Assessment Lower Extremity Assessment: LLE deficits/detail LLE Deficits / Details: s/p L femoral repair; pt able to minimally abduct, strength throughout <3/5 limited by pain; unable to tolerate beyond ~30' knee flexion, lacking 10' extension due  to pain LLE: Unable to fully assess due to pain;Unable to fully assess due to immobilization LLE Coordination: decreased gross motor;decreased fine motor    Cervical / Trunk Assessment Cervical / Trunk Assessment: Kyphotic  Communication   Communication: No difficulties  Cognition Arousal/Alertness: Awake/alert Behavior During Therapy: Anxious Overall Cognitive  Status: No family/caregiver present to determine baseline cognitive functioning                                 General Comments: WFL for simple tasks, although anxious and very distracted by pain requiring frequent cues for task and sequencing      General Comments      Exercises     Assessment/Plan    PT Assessment Patient needs continued PT services  PT Problem List Decreased strength;Decreased range of motion;Decreased activity tolerance;Decreased balance;Decreased mobility;Decreased knowledge of use of DME;Decreased knowledge of precautions;Pain       PT Treatment Interventions DME instruction;Gait training;Stair training;Functional mobility training;Therapeutic activities;Therapeutic exercise;Balance training;Patient/family education    PT Goals (Current goals can be found in the Care Plan section)  Acute Rehab PT Goals Patient Stated Goal: Return home with 24/7 support from family PT Goal Formulation: With patient Time For Goal Achievement: 11/08/18 Potential to Achieve Goals: Good    Frequency Min 5X/week   Barriers to discharge        Co-evaluation PT/OT/SLP Co-Evaluation/Treatment: Yes Reason for Co-Treatment: For patient/therapist safety;To address functional/ADL transfers(Limited by pain) PT goals addressed during session: Mobility/safety with mobility         AM-PAC PT "6 Clicks" Mobility  Outcome Measure Help needed turning from your back to your side while in a flat bed without using bedrails?: A Lot Help needed moving from lying on your back to sitting on the side of a flat bed without using bedrails?: A Lot Help needed moving to and from a bed to a chair (including a wheelchair)?: Total Help needed standing up from a chair using your arms (e.g., wheelchair or bedside chair)?: Total Help needed to walk in hospital room?: Total Help needed climbing 3-5 steps with a railing? : Total 6 Click Score: 8    End of Session   Activity  Tolerance: Patient limited by pain Patient left: in bed;with call bell/phone within reach;with bed alarm set Nurse Communication: Mobility status PT Visit Diagnosis: Other abnormalities of gait and mobility (R26.89);Pain Pain - Right/Left: Left Pain - part of body: Leg    Time: 7062-3762 PT Time Calculation (min) (ACUTE ONLY): 20 min   Charges:   PT Evaluation $PT Eval Moderate Complexity: 1 Mod        Mabeline Caras, PT, DPT Acute Rehabilitation Services  Pager [phone] Office [place] 10/25/2018, 10:33 AM

## 2018-10-25 NOTE — Progress Notes (Signed)
Orthopaedic Trauma Progress Note  S: Doing okay this morning, pain major issue overnight but doing a little better now. Asking for stronger pain medication. No other concerns. Was started on Vancomycin and ceftriaxone post-operative but specimen failed to be sent for culture, will stop these antibiotics this AM.   O:  Vitals:   10/24/18 2313 10/25/18 0319  BP: (!) 94/59 (!) 88/66  Pulse: 78 92  Resp: 17 15  Temp: 98.7 F (37.1 C) 98.3 F (36.8 C)  SpO2: 100% 96%    General - laying in bed awake, NAD  Left Lower Extremity -  Dressing in place from foot to thigh is clean, dry, intact. Tenderness in thigh with palpation. Less tender in lower leg. Patient holds foot in plantarflexion. Is able to dorsiflex ankle some but is weak. Able to wiggle toes. Tolerates minimal knee flexion. Wiggles toes. +DP pulse  Imaging: Stable post op imaging.   Labs:  Results for orders placed or performed during the hospital encounter of 10/22/18 (from the past 24 hour(s))  Glucose, capillary     Status: None   Collection Time: 10/24/18  8:06 AM  Result Value Ref Range   Glucose-Capillary 99 70 - 99 mg/dL  Glucose, capillary     Status: Abnormal   Collection Time: 10/24/18  4:32 PM  Result Value Ref Range   Glucose-Capillary 149 (H) 70 - 99 mg/dL  Glucose, capillary     Status: Abnormal   Collection Time: 10/24/18  7:41 PM  Result Value Ref Range   Glucose-Capillary 163 (H) 70 - 99 mg/dL  CBC     Status: Abnormal   Collection Time: 10/25/18  5:22 AM  Result Value Ref Range   WBC 13.0 (H) 4.0 - 10.5 K/uL   RBC 3.69 (L) 3.87 - 5.11 MIL/uL   Hemoglobin 9.7 (L) 12.0 - 15.0 g/dL   HCT 16.130.0 (L) 09.636.0 - 04.546.0 %   MCV 81.3 80.0 - 100.0 fL   MCH 26.3 26.0 - 34.0 pg   MCHC 32.3 30.0 - 36.0 g/dL   RDW 40.916.4 (H) 81.111.5 - 91.415.5 %   Platelets 441 (H) 150 - 400 K/uL   nRBC 0.0 0.0 - 0.2 %  Basic metabolic panel     Status: Abnormal   Collection Time: 10/25/18  5:22 AM  Result Value Ref Range   Sodium 134 (L)  135 - 145 mmol/L   Potassium 4.0 3.5 - 5.1 mmol/L   Chloride 101 98 - 111 mmol/L   CO2 23 22 - 32 mmol/L   Glucose, Bld 114 (H) 70 - 99 mg/dL   BUN 10 8 - 23 mg/dL   Creatinine, Ser 7.820.58 0.44 - 1.00 mg/dL   Calcium 8.6 (L) 8.9 - 10.3 mg/dL   GFR calc non Af Amer >60 >60 mL/min   GFR calc Af Amer >60 >60 mL/min   Anion gap 10 5 - 15  Magnesium     Status: Abnormal   Collection Time: 10/25/18  5:22 AM  Result Value Ref Range   Magnesium 1.6 (L) 1.7 - 2.4 mg/dL    Assessment: 63 year old female with failed orthopaedic hardware  Injuries: 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 Will change tomorrow  Orthopedic device(s):none  CV/Blood loss: Acute blood loss anemia, Hgb 9.7 this AM. A bit hypotensive  Pain management:  1. Tylenol 650 mg q 6 hours scheduled 2. Robaxin 500 mg q 6 hours  PRN 3. Oxycodone 5-10 mg q 4 hours PRN 4. Fentanyl 25 mcg q 2 hours PRN PRN  VTE prophylaxis: Lovenox starting today  ID: Ceftriaxone + vancomycin. Will stop this AM  Foley/Lines: No foley, Continue IVFs  Medical co-morbidities: diabetes, vitamin d deficiency, GERD  Impediments to Fracture Healing: diabetes, vitamin d deficiency  Dispo: PT eval today, dispo pending  Follow - up plan: 2 weeks for suture removal and repeat x-rays    [name] A. [facility] ?([phone]? (phone)

## 2018-10-25 NOTE — Progress Notes (Signed)
Occupational Therapy Evaluation Patient Details Name: Melanie Parsons MRN: [id number] DOB: [birth year] Today's Date: 10/25/2018    History of Present Illness Pt is a 63 y.o. 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 S/p repair 7/9. 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   Clinical Impression   Pt presents with above diagnosis. PTA pt PLOF Mod I with ADLs and IADLs. Pt currently requires assistants due to pain and limitation of LLE in functional transfers and ADL tasks. Pt limited with functional this session and engaged in EOB with Max A+2. Pt will benefit from continued acute OT to address deficits prior to d/c to SNF level, pt continuously declines SNF rehab, would prefer HHOT. Pt educated of OT recommendation of SNF.     Follow Up Recommendations  SNF;Home health OT;Supervision/Assistance - 24 hour    Equipment Recommendations       Recommendations for Other Services       Precautions / Restrictions Precautions Precautions: Fall Restrictions Weight Bearing Restrictions: Yes LLE Weight Bearing: Non weight bearing      Mobility Bed Mobility Overal bed mobility: Needs Assistance Bed Mobility: Supine to Sit;Sit to Supine     Supine to sit: Max assist;+2 for physical assistance Sit to supine: Max assist;+2 for physical assistance   General bed mobility comments: MaxA+2 to assist BLEs to EOB and trunk elevation; max, multimodal cues throughout to stay on task and for sequencing. Very limited by pain  Transfers                 General transfer comment: Declined due to pain    Balance Overall balance assessment: Needs assistance   Sitting balance-Leahy Scale: Fair                                     ADL either performed or assessed with clinical judgement   ADL  Overall ADL's : Needs assistance/impaired Eating/Feeding: Set up;Bed level   Grooming: Wash/dry hands;Wash/dry face;Oral care;Sitting;Minimal assistance   Upper Body Bathing: Modified independent;Sitting   Lower Body Bathing: Moderate assistance;Sitting/lateral leans   Upper Body Dressing : Modified independent   Lower Body Dressing: Moderate assistance;Sitting/lateral leans                 General ADL Comments: Transfer deferred due to level of pain. Pt very hesitant to transfer to EOB required Max A +2     Vision         Perception     Praxis      Pertinent Vitals/Pain Pain Assessment: Faces Faces Pain Scale: Hurts whole lot Pain Location: LLE Pain Descriptors / Indicators: Guarding;Grimacing;Moaning Pain Intervention(s): Monitored during session;Repositioned;Limited activity within patient's tolerance     Hand Dominance     Extremity/Trunk Assessment Upper Extremity Assessment Upper Extremity Assessment: RUE deficits/detail RUE Deficits / Details: reports chronic R shoulder pain and hesitant to move; strong grip   Lower Extremity Assessment Lower Extremity Assessment: Defer to PT evaluation LLE Deficits / Details: s/p L femoral repair; pt able to minimally abduct, strength throughout <3/5 limited by pain; unable to tolerate beyond ~30' knee flexion, lacking 10' extension due to pain LLE: Unable to fully assess due to pain;Unable to fully assess due to immobilization LLE Coordination: decreased gross motor;decreased fine  motor   Cervical / Trunk Assessment Cervical / Trunk Assessment: Kyphotic   Communication Communication Communication: No difficulties   Cognition Arousal/Alertness: Awake/alert Behavior During Therapy: Anxious Overall Cognitive Status: No family/caregiver present to determine baseline cognitive functioning                                 General Comments: WFL for simple tasks, although anxious and very distracted by pain  requiring frequent cues for task and sequencing   General Comments       Exercises     Shoulder Instructions      Home Living Family/patient expects to be discharged to:: Private residence Living Arrangements: Spouse/significant other;Children Available Help at Discharge: Family;Available PRN/intermittently Type of Home: House Home Access: Ramped entrance     Home Layout: One level     Bathroom Shower/Tub: Producer, television/film/videoWalk-in shower   Bathroom Toilet: Handicapped height     Home Equipment: Environmental consultantWalker - 2 wheels;Bedside commode;Hand held shower head;Shower seat;Grab bars - tub/shower;Other (comment)(lift chair, hospital bed [facility])   Additional Comments: Daughter at home until 4-5p, husband home after that      Prior Functioning/Environment Level of Independence: Independent        Comments: Ambulatory without DME        OT Problem List: Decreased activity tolerance;Impaired balance (sitting and/or standing);Pain;Decreased safety awareness;Decreased knowledge of use of DME or AE;Decreased knowledge of precautions      OT Treatment/Interventions: Self-care/ADL training;Therapeutic exercise;DME and/or AE instruction;Therapeutic activities;Balance training;Patient/family education    OT Goals(Current goals can be found in the care plan section) Acute Rehab OT Goals Patient Stated Goal: Return home with 24/7 support from family OT Goal Formulation: With patient Time For Goal Achievement: 11/08/18 Potential to Achieve Goals: Fair  OT Frequency: Min 2X/week   Barriers to D/C:            Co-evaluation PT/OT/SLP Co-Evaluation/Treatment: Yes Reason for Co-Treatment: For patient/therapist safety;Complexity of the patient's impairments (multi-system involvement)   OT goals addressed during session: ADL's and self-care      AM-PAC OT "6 Clicks" Daily Activity     Outcome Measure Help from another person eating meals?: A Little Help from another person taking care of  personal grooming?: A Little Help from another person toileting, which includes using toliet, bedpan, or urinal?: A Lot Help from another person bathing (including washing, rinsing, drying)?: A Little Help from another person to put on and taking off regular upper body clothing?: A Little Help from another person to put on and taking off regular lower body clothing?: A Lot 6 Click Score: 16   End of Session Nurse Communication: Mobility status;Weight bearing status  Activity Tolerance: Patient limited by pain Patient left: in bed;with call bell/phone within reach;with bed alarm set  OT Visit Diagnosis: Unsteadiness on feet (R26.81);Repeated falls (R29.6);Muscle weakness (generalized) (M62.81);Pain Pain - Right/Left: Left                Time: [id number] OT Time Calculation (min): 20 min Charges:  OT General Charges $OT Visit: 1 Visit OT Evaluation $OT Eval Low Complexity: 1 Low  [place] OldEvan Nicoli Nardozzi, MSOT, OTR/L  [facility] Rehabilitation Services  [phone]   Zigmund Danielvan M Elzabeth Mcquerry 10/25/2018, 12:46 PM

## 2018-10-25 NOTE — Plan of Care (Signed)

## 2018-10-26 LAB — CBC
HCT: 29.7 % — ABNORMAL LOW (ref 36.0–46.0)
Hemoglobin: 9.5 g/dL — ABNORMAL LOW (ref 12.0–15.0)
MCH: 26.2 pg (ref 26.0–34.0)
MCHC: 32 g/dL (ref 30.0–36.0)
MCV: 81.8 fL (ref 80.0–100.0)
Platelets: 507 10*3/uL — ABNORMAL HIGH (ref 150–400)
RBC: 3.63 MIL/uL — ABNORMAL LOW (ref 3.87–5.11)
RDW: 16.6 % — ABNORMAL HIGH (ref 11.5–15.5)
WBC: 13.9 10*3/uL — ABNORMAL HIGH (ref 4.0–10.5)
nRBC: 0 % (ref 0.0–0.2)

## 2018-10-26 LAB — BASIC METABOLIC PANEL
Anion gap: 8 (ref 5–15)
BUN: 7 mg/dL — ABNORMAL LOW (ref 8–23)
CO2: 24 mmol/L (ref 22–32)
Calcium: 8.7 mg/dL — ABNORMAL LOW (ref 8.9–10.3)
Chloride: 101 mmol/L (ref 98–111)
Creatinine, Ser: 0.57 mg/dL (ref 0.44–1.00)
GFR calc Af Amer: >60 mL/min (ref >60)
GFR calc non Af Amer: >60 mL/min (ref >60)
Glucose, Bld: 100 mg/dL — ABNORMAL HIGH (ref 70–99)
Potassium: 3.6 mmol/L (ref 3.5–5.1)
Sodium: 133 mmol/L — ABNORMAL LOW (ref 135–145)

## 2018-10-26 LAB — GLUCOSE, CAPILLARY
Glucose-Capillary: 103 mg/dL — ABNORMAL HIGH (ref 70–99)
Glucose-Capillary: 109 mg/dL — ABNORMAL HIGH (ref 70–99)
Glucose-Capillary: 90 mg/dL (ref 70–99)
Glucose-Capillary: 104 mg/dL — ABNORMAL HIGH (ref 70–99)

## 2018-10-26 NOTE — Plan of Care (Signed)

## 2018-10-26 NOTE — Progress Notes (Signed)
PROGRESS NOTE    Melanie Parsons  ZWC:[id number] DOB: Nov 18, 1955 DOA: 10/22/2018 PCP: Enid Skeens., MD    Brief Narrative:   Melanie Parsons is a 63 y.o. female with medical history significant for but not limited to depression and anxiety, GERD, tobacco abuse, arthritis, history of migraines, history of urinary tract infections, history of left femur and hip fracture and other comorbidities who presents with a chief complaint of left leg pain.  Patient states that she was ambulating yesterday from her room to the bathroom and felt a sudden onset of sharp pain.  States that she thought she was going to fall so she grabbed onto the wall and asked her daughter to bring in a rolling chair and set on rolling chair.  Patient got up a few times and transfer to a bedside commode since then but today was unable to ambulate and put pressure on her leg.  She also notes that her leg started swelling around the hip area which was new since overnight.  She denies any chest pain, lightheadedness or dizziness.  No nausea or vomiting.  Still smokes sometimes.  No other concerns or plans at this time and [facility] was asked to admit this patient for a left leg periprosthetic femur fracture that was recurrent and Orthopedic Surgery requested the patient be sent to [facility] for further evaluation.  In the ED the patient had basic blood work done as well as imaging of her hip and femur.  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  Left femur x-ray notable for recurrent fracture in the midshaft of the left femur in the area of prior fracture with additional fracture of the fixation sideplate. --Orthopedics following, Dr. Doreatha Martin --s/p repair of left femur fracture and removal of hardware 7/9 --Lovenox for DVT prophylaxis --NWB LLE --bone culture ordered but was not sent off from OR --Vanc and ceftrixone; discontinued by orthopedics on 10/25/2018 --Pain control with oxycodone 5 mg p.o. q4h prn, fentanyl 25 mg IV prn and Robaxin 500 mg q6h prn --PT/OT following; recommend SNF patient declines and desires home health --Will need orthopedic f/u 2 weeks following discharge for wound check, suture removal, repeat x-rays --Plan discharge on Monday with home health services  Ecoli UTI --ceftriaxone, will now change to Keflex 500 mg p.o. twice daily for 3 days in accordance with urine culture susceptibilities  GERD: Continue PPI  Iron deficiency anemia Hemoglobin stable 12.5 on admission.     --Hgb 12.5-->11.4-->10.9-->9.7-->9.5 --Continue home ferrous sulfate 325 mg p.o. twice daily. --continue to monitor H/H closely in the setting of femur fracture with planned surgical intervention.  Type 2 diabetes mellitus Hemoglobin A1c 5.6.  Diet controlled. --Continue to monitor CBGs and insulin sliding scale prn  Hx COPD Not in acute exacerbation.  No inhaler therapy outpatient.  Oxygenating well on room air. --nebs prn  Depression/anxiety Continue home alprazolam 1 mg p.o. 3 times daily, mirtazapine 30 mg p.o. nightly   DVT prophylaxis: Lovenox Code Status: Full code  Family Communication: None Disposition Plan: Continue inpatient, continue PT/OT efforts, patient refuses SNF placement, will likely discharge home on Monday with home health services, case management aware   Consultants:   Orthopedics, Dr. Jena GaussHaddix  Procedures:  s/p repair of left femur fracture and removal of hardware on 7/9   Antimicrobials:   Vancomycin 7/9>>  Ceftriaxone 7/9>>   Subjective: Patient seen and examined at bedside, pain controlled.  No complaints at this time.  Denies headache, no fever/chills/night sweats, no nausea/vomiting/diarrhea, no chest pain, no palpitations, no shortness of breath, no abdominal pain, no cough/congestion.  No acute events overnight per nursing staff.  Objective: Vitals:   10/26/18 0026 10/26/18 0441 10/26/18 0832 10/26/18 0920  BP: 94/62 (!) 96/59 97/67 105/68  Pulse: (!) 110 (!) 112 (!) 103 85  Resp:   16   Temp: (!) 100.5 F (38.1 C) 99.3 F (37.4 C) 98.5 F (36.9 C)   TempSrc: Oral Oral Oral   SpO2: 95% 94% 90%   Weight:      Height:        Intake/Output Summary (Last 24 hours) at 10/26/2018 1236 Last data filed at 10/26/2018 0900 Gross per 24 hour  Intake 446.58 ml  Output 800 ml  Net -353.42 ml   Filed Weights   10/22/18 1254 10/22/18 2111 10/24/18 0908  Weight: 47.6 kg 48.9 kg 48.9 kg    Examination:  General exam: Appears calm, mildly discomfort when moves lower extremity Respiratory system: Clear to auscultation. Respiratory effort normal. Cardiovascular system: S1 & S2 heard, RRR. No JVD, murmurs, rubs, gallops or clicks. No pedal edema. Gastrointestinal system: Abdomen is nondistended, soft and nontender. No organomegaly or masses felt. Normal bowel sounds heard. Central nervous system: Alert and oriented. No focal neurological deficits. Extremities: No peripheral edema, neurovascular intact, dressing noted, clean/dry/intact Skin: No rashes, lesions or ulcers Psychiatry: Judgement and insight appear normal. Mood & affect appropriate.     Data Reviewed: I have personally reviewed following labs and imaging studies  CBC: Recent Labs  Lab 10/22/18 1415 10/23/18 0246 10/24/18 0230 10/25/18 0522 10/26/18 0230  WBC 11.7* 10.2 10.4 13.0* 13.9*  NEUTROABS 7.7  --   --   --   --   HGB 12.5 11.4* 10.9* 9.7* 9.5*  HCT 38.8 35.4* 34.0*  30.0* 29.7*  MCV 83.3 82.5 82.5 81.3 81.8  PLT 556* 518* 482* 441* 507*   Basic Metabolic Panel: Recent Labs  Lab 10/22/18 1415 10/22/18 1851 10/23/18 0246 10/24/18 0230 10/25/18 0522 10/26/18 0230  NA 136  --  135 135 134* 133*  K 4.4  --  4.4 3.9 4.0 3.6  CL 103  --  104 104 101 101  CO2 23  --  19* 24 23 24   GLUCOSE 101*  --  89 99 114* 100*  BUN 17  --  17 11 10  7*  CREATININE 0.56  --  0.61 0.55 0.58 0.57  CALCIUM 9.3  --  9.0 9.1 8.6* 8.7*  MG  --  2.1  --   --  1.6*  --   PHOS  --  3.9  --   --   --   --    GFR: Estimated Creatinine Clearance: 55.6 mL/min (by C-G formula based on SCr of 0.57 mg/dL). Liver Function Tests: Recent Labs  Lab 10/22/18 1851  ALBUMIN 3.4*   No results for input(s): LIPASE, AMYLASE in the last 168 hours. No results for input(s): AMMONIA in the last 168 hours. Coagulation Profile: Recent Labs  Lab 10/22/18 2243  INR 1.1   Cardiac Enzymes: No results for input(s): CKTOTAL, CKMB, CKMBINDEX, TROPONINI in the last 168 hours. BNP (last 3 results) No results for input(s): PROBNP in the last 8760 hours. HbA1C: No results for input(s): HGBA1C in the last 72 hours. CBG: Recent Labs  Lab 10/25/18 1129 10/25/18 1603 10/25/18 2128 10/26/18 0829 10/26/18 1226  GLUCAP 101* 118* 169* 103* 109*   Lipid Profile: No results for input(s): CHOL, HDL, LDLCALC, TRIG, CHOLHDL, LDLDIRECT in the last 72 hours. Thyroid Function Tests: No results for input(s): TSH, T4TOTAL, FREET4, T3FREE, THYROIDAB in the last 72 hours. Anemia Panel: No results for input(s): VITAMINB12, FOLATE, FERRITIN, TIBC, IRON, RETICCTPCT in the last 72 hours. Sepsis Labs: No results for input(s): PROCALCITON, LATICACIDVEN in the last 168 hours.  Recent Results (from the past 240 hour(s))  SARS Coronavirus 2 (CEPHEID - [facility]), Hosp Order     Status: None   Collection Time: 10/22/18  2:21 PM   Specimen: Nasopharyngeal Swab  Result Value Ref  Range Status   SARS Coronavirus 2 NEGATIVE NEGATIVE Final    Comment: (NOTE) If result is NEGATIVE SARS-CoV-2 target nucleic acids are NOT DETECTED. The SARS-CoV-2 RNA is generally detectable in upper and lower  respiratory specimens during the acute phase of infection. The lowest  concentration of SARS-CoV-2 viral copies this assay can detect is 250  copies / mL. A negative result does not preclude SARS-CoV-2 infection  and should not be used as the sole basis for treatment or other  patient management decisions.  A negative result may occur with  improper specimen collection / handling, submission of specimen other  than nasopharyngeal swab, presence of viral mutation(s) within the  areas targeted by this assay, and inadequate number of viral copies  (<250 copies / mL). A negative result must be combined with clinical  observations, patient history, and epidemiological information. If result is POSITIVE SARS-CoV-2 target nucleic acids are DETECTED. The SARS-CoV-2 RNA is generally detectable in upper and lower  respiratory specimens dur ing the acute phase of infection.  Positive  results are indicative of active infection with SARS-CoV-2.  Clinical  correlation with patient history and other diagnostic information is  necessary to determine patient infection status.  Positive results do  not rule out bacterial infection or co-infection with other viruses. If result is PRESUMPTIVE POSTIVE SARS-CoV-2 nucleic acids MAY BE PRESENT.   A presumptive positive result was obtained on the submitted specimen  and confirmed on repeat testing.  While 2019 novel coronavirus  (SARS-CoV-2) nucleic acids may be present in the submitted sample  additional confirmatory testing may be necessary for epidemiological  and / or clinical management purposes  to differentiate between  SARS-CoV-2 and other Sarbecovirus currently known to infect humans.  If clinically indicated additional testing with an  alternate test  methodology [phone] is advised. The SARS-CoV-2 RNA is generally  detectable in upper and lower respiratory sp ecimens during the acute  phase of infection. The expected result is Negative. Fact Sheet for Patients:  BoilerBrush.com.cyhttps://www.fda.gov/media/136312/download Fact Sheet for Healthcare Providers: https://pope.com/https://www.fda.gov/media/136313/download This test is not yet approved or cleared by the [place] FDA and has been authorized for detection and/or diagnosis of SARS-CoV-2 by FDA under an Emergency Use Authorization (EUA).  This EUA will remain in effect (meaning this test can be used) for the duration of the COVID-19 declaration under Section 564(b)(1) of the Act, 21 U.S.C. section 360bbb-3(b)(1), unless the authorization is terminated  or revoked sooner. Performed at [facility], [place] [address], [place], [place] [address]   MRSA PCR Screening     Status: None   Collection Time: 10/23/18  6:07 AM   Specimen: Nasal Mucosa; Nasopharyngeal  Result Value Ref Range Status   MRSA by PCR NEGATIVE NEGATIVE Final    Comment:        The GeneXpert MRSA Assay (FDA approved for NASAL specimens only), is one component of a comprehensive MRSA colonization surveillance program. It is not intended to diagnose MRSA infection nor to guide or monitor treatment for MRSA infections. Performed at [facility], [place] [address], [place], [place] [address]   Urine culture     Status: Abnormal   Collection Time: 10/23/18  4:44 PM   Specimen: Urine, Random  Result Value Ref Range Status   Specimen Description URINE, RANDOM  Final   Special Requests   Final    NONE Performed at [facility], [place] [address], [place], [place] [address]    Culture >=100,000 COLONIES/mL ESCHERICHIA COLI (A)  Final   Report Status 10/25/2018 FINAL  Final   Organism ID, Bacteria ESCHERICHIA COLI (A)  Final      Susceptibility   Escherichia coli - MIC*    AMPICILLIN >=32  RESISTANT Resistant     CEFAZOLIN <=4 SENSITIVE Sensitive     CEFTRIAXONE <=1 SENSITIVE Sensitive     CIPROFLOXACIN <=0.25 SENSITIVE Sensitive     GENTAMICIN <=1 SENSITIVE Sensitive     IMIPENEM <=0.25 SENSITIVE Sensitive     NITROFURANTOIN <=16 SENSITIVE Sensitive     TRIMETH/SULFA <=20 SENSITIVE Sensitive     AMPICILLIN/SULBACTAM >=32 RESISTANT Resistant     PIP/TAZO <=4 SENSITIVE Sensitive     Extended ESBL NEGATIVE Sensitive     * >=100,000 COLONIES/mL ESCHERICHIA COLI         Radiology Studies: Dg C-arm 1-60 Min  Result Date: 10/24/2018 CLINICAL DATA:  Surgery: Left femur ORIF Fluoro time: 3 minutes 33 seconds RSTO: AW Location: [place] OR EXAM: LEFT FEMUR 2 VIEWS; DG C-ARM 61-120 MIN COMPARISON:  10/22/2018 FINDINGS: Twelve images are submitted, showing mid femur fracture below prosthetic prior to removal of fractured plate and following replacement followup LATERAL screw plate and cerclage wires. IMPRESSION: ORIF of LEFT femur with replacement of the LATERAL screw plate. Electronically Signed   By: [name] M.D.   On: 10/24/2018 14:27   Dg Femur Min 2 Views Left  Result Date: 10/24/2018 CLINICAL DATA:  Surgery: Left femur ORIF Fluoro time: 3 minutes 33 seconds RSTO: AW Location: [place] OR EXAM: LEFT FEMUR 2 VIEWS; DG C-ARM 61-120 MIN COMPARISON:  10/22/2018 FINDINGS: Twelve images are submitted, showing mid femur fracture below prosthetic prior to removal of fractured plate and following replacement followup LATERAL screw plate and cerclage wires. IMPRESSION: ORIF of LEFT femur with replacement of the LATERAL screw plate. Electronically Signed   By: [name] M.D.   On: 10/24/2018 14:27   Dg Femur Port Min 2 Views Left  Result Date: 10/24/2018 CLINICAL DATA:  Status post revision fracture fixation scratch the status post revision of a periprosthetic fracture of left femur today. EXAM: LEFT FEMUR PORTABLE 2 VIEWS COMPARISON:  Plain films left femur 10/22/2018.  FINDINGS: Bipolar right hip hemiarthroplasty is again seen. Previously seen fixation plate has been removed and a new fixation plate and screws is in place along the lateral aspect of the left femur for fixation of a fracture at  the tip of the femoral stem. Bone graft material is noted. Hardware is intact. Position and alignment are near anatomic. No acute abnormality. Bones are osteopenic. IMPRESSION: Status post revision fixation of a periprosthetic fracture of the left femur. No acute abnormality Electronically Signed   By: Drusilla Kannerhomas  Dalessio M.D.   On: 10/24/2018 16:08        Scheduled Meds: . acetaminophen  650 mg Oral Q6H  . ALPRAZolam  1 mg Oral TID  . calcium citrate  200 mg of elemental calcium Oral BID  . calcium-vitamin D  1 tablet Oral Daily  . cephALEXin  500 mg Oral Q12H  . cholecalciferol  2,000 Units Oral BID  . docusate sodium  100 mg Oral BID  . enoxaparin  40 mg Subcutaneous Q24H  . feeding supplement (GLUCERNA SHAKE)  237 mL Oral BID BM  . ferrous sulfate  325 mg Oral BID WC  . insulin aspart  0-5 Units Subcutaneous QHS  . insulin aspart  0-9 Units Subcutaneous TID WC  . mirtazapine  30 mg Oral QHS  . multivitamin with minerals  1 tablet Oral Daily  . pantoprazole  40 mg Oral Daily  . ascorbic acid  500 mg Oral Daily   Continuous Infusions: . lactated ringers 10 mL/hr at 10/25/18 2250     LOS: 4 days    Time spent: 29 minutes    Alvira PhilipsEric J [place], DO [facility] Pager [phone]  If 7PM-7AM, please contact night-coverage [email or web address] Password [email or web address] 10/26/2018, 12:36 PM

## 2018-10-26 NOTE — Plan of Care (Signed)
  Problem: Education: Goal: Knowledge of General Education information will improve Description: Including pain rating scale, medication(s)/side effects and non-pharmacologic comfort measures Outcome: Progressing   Problem: Pain Managment: Goal: General experience of comfort will improve Outcome: Progressing   Problem: Safety: Goal: Ability to remain free from injury will improve Outcome: Progressing   Problem: Activity: Goal: Risk for activity intolerance will decrease Outcome: Progressing   Problem: Skin Integrity: Goal: Risk for impaired skin integrity will decrease Outcome: Progressing

## 2018-10-26 NOTE — Progress Notes (Signed)
Occupational Therapy Treatment Patient Details Name: Melanie Parsons [place] MRN: [id number] DOB: Oct 05, 1955 Today's Date: 10/26/2018    History of present illness Pt is a 63 y.o. 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 S/p repair 7/9. 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   OT comments  Pt. Very reluctant to participate in skilled OT tx session secondary to c/o pain.  Attempted bed mobility. Pt. Unable to bring b les off of bed and requested back to bed.  Reports she will be fine when she returns home but can not participate any further this day.  Will attempt back as able to progress patient with adls with focus on bsc transfer next session.    Follow Up Recommendations  SNF;Home health OT;Supervision/Assistance - 24 hour    Equipment Recommendations       Recommendations for Other Services      Precautions / Restrictions Precautions Precautions: Fall Restrictions LLE Weight Bearing: Non weight bearing       Mobility Bed Mobility Overal bed mobility: Needs Assistance Bed Mobility: Supine to Sit;Sit to Supine     Supine to sit: Max assist Sit to supine: Max assist   General bed mobility comments: assistance required to manage bringing LLE towards eob while she managed RLE.  pt. then asking therapist asst. to walk over to L side of bed so "i can pull on you". attempted education on why she can not pull on me, pt. interupted and explained her arms hurt so bad how can she prop herself up?  educated on use of bed rails, pt. reluctantly tried to control ub but still pulling on therapist asst. could not bring bles oob. asked therapist asst. to stop " i can tell you right now its not happening i can not get to the side of this bed".  assisted b les back into bed max a and then with hob flat pt. able to push with  r le but made me bring it into flexed position for her and she then used b ues to assist with pulling up in bed.  Transfers                 General transfer comment: pt. unable to get to eob or complete transfers secondary to pain but repeats multiple times she knows she will be fine once she gets home. blames pain and inability to transfer on being weak from only eating a muffin this am    Balance                                           ADL either performed or assessed with clinical judgement   ADL Overall ADL's : Needs assistance/impaired                         [facility] Details (indicate cue type and reason): Pt. reports she will be using a bsc at home.  spoke at length and encouraged attempts to complete a stand pivot transfer to bsc today.  she declned multiple times           General ADL Comments: pt. declined oob and multiple tx. session options.  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  reports pain is too great for full oob this day due to how "they did the incision higher or lower this time" but just states that if feels different.  states everything will "fall into place when she gets home".  states she just cant doing any oob today.  agreeable to eob (refer to bed mobility for further details).  reports her home has been adapted for her sx. states walk in shower with no ledge, has a bed with hob and feet functions for up/down and that she has it set up to travel very short distances if any distances at all for her adls     Vision       Perception     Praxis      Cognition Arousal/Alertness: Awake/alert Behavior During Therapy: Anxious                                            Exercises     Shoulder Instructions       General Comments      Pertinent Vitals/ Pain       Pain Assessment: 0-10 Pain Score: 8  Pain  Location: LLE Pain Descriptors / Indicators: Aching Pain Intervention(s): Limited activity within patient's tolerance;Monitored during session;Premedicated before session;Patient requesting pain meds-RN notified  Home Living                                          Prior Functioning/Environment              Frequency  Min 2X/week        Progress Toward Goals  OT Goals(current goals can now be found in the care plan section)  Progress towards OT goals: Not progressing toward goals - comment(unable to complete full bed mobility due to c/o pain, also declined all other tx options)     Plan Discharge plan remains appropriate    Co-evaluation                 AM-PAC OT "6 Clicks" Daily Activity     Outcome Measure   Help from another person eating meals?: A Little Help from another person taking care of personal grooming?: A Little Help from another person toileting, which includes using toliet, bedpan, or urinal?: A Lot Help from another person bathing (including washing, rinsing, drying)?: A Little Help from another person to put on and taking off regular upper body clothing?: A Little Help from another person to put on and taking off regular lower body clothing?: A Lot 6 Click Score: 16    End of Session    OT Visit Diagnosis: Unsteadiness on feet (R26.81);Repeated falls (R29.6);Muscle weakness (generalized) (M62.81);Pain Pain - Right/Left: Left   Activity Tolerance Patient limited by pain   Patient Left in bed;with call bell/phone within reach;with bed alarm set   Nurse Communication          Time: 5093-2671 OT Time Calculation (min): 15 min  Charges: OT General Charges $OT Visit: 1 Visit OT Treatments $Self Care/Home Management : 8-22 mins   Janice Coffin, COTA/L 10/26/2018, 12:31 PM

## 2018-10-27 LAB — BASIC METABOLIC PANEL
Anion gap: 10 (ref 5–15)
BUN: 6 mg/dL — ABNORMAL LOW (ref 8–23)
CO2: 24 mmol/L (ref 22–32)
Calcium: 8.8 mg/dL — ABNORMAL LOW (ref 8.9–10.3)
Chloride: 100 mmol/L (ref 98–111)
Creatinine, Ser: 0.6 mg/dL (ref 0.44–1.00)
GFR calc Af Amer: >60 mL/min (ref >60)
GFR calc non Af Amer: >60 mL/min (ref >60)
Glucose, Bld: 92 mg/dL (ref 70–99)
Potassium: 3.4 mmol/L — ABNORMAL LOW (ref 3.5–5.1)
Sodium: 134 mmol/L — ABNORMAL LOW (ref 135–145)

## 2018-10-27 LAB — CBC
HCT: 30.2 % — ABNORMAL LOW (ref 36.0–46.0)
Hemoglobin: 9.6 g/dL — ABNORMAL LOW (ref 12.0–15.0)
MCH: 26.5 pg (ref 26.0–34.0)
MCHC: 31.8 g/dL (ref 30.0–36.0)
MCV: 83.4 fL (ref 80.0–100.0)
Platelets: 515 10*3/uL — ABNORMAL HIGH (ref 150–400)
RBC: 3.62 MIL/uL — ABNORMAL LOW (ref 3.87–5.11)
RDW: 16.9 % — ABNORMAL HIGH (ref 11.5–15.5)
WBC: 11.4 10*3/uL — ABNORMAL HIGH (ref 4.0–10.5)
nRBC: 0 % (ref 0.0–0.2)

## 2018-10-27 LAB — GLUCOSE, CAPILLARY
Glucose-Capillary: 80 mg/dL (ref 70–99)
Glucose-Capillary: 96 mg/dL (ref 70–99)
Glucose-Capillary: 113 mg/dL — ABNORMAL HIGH (ref 70–99)
Glucose-Capillary: 107 mg/dL — ABNORMAL HIGH (ref 70–99)

## 2018-10-27 LAB — MAGNESIUM: Magnesium: 1.8 mg/dL (ref 1.7–2.4)

## 2018-10-27 MED ORDER — POTASSIUM CHLORIDE CRYS ER 20 MEQ PO TBCR
30.0000 meq | EXTENDED_RELEASE_TABLET | ORAL | Status: AC
  Administered 2018-10-27 (×2): 30 meq via ORAL
  Filled 2018-10-27 (×2): qty 1

## 2018-10-27 NOTE — Progress Notes (Signed)
Occupational Therapy Treatment Patient Details Name: Melanie Parsons MRN: [id number] DOB: [birth year] Today's Date: 10/27/2018    History of present illness Pt is a 63 y.o. 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 S/p repair 7/9. 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   OT comments  Pt. Seen for skilled OT treatment session.  Bed mobility remains max a in/out of bed. Able to complete sit/stand x1.  Able to maintain NWB but unable to initiate any pivot or transfer to chair secondary to c/o pain and dizziness. Requested back to bed.  cna assisted with pulling pt. Up in bed as pt. States she is unable.  Will continue to follow along.   Follow Up Recommendations  SNF;Home health OT;Supervision/Assistance - 24 hour    Equipment Recommendations       Recommendations for Other Services      Precautions / Restrictions Precautions Precautions: Fall Restrictions LLE Weight Bearing: Non weight bearing       Mobility Bed Mobility Overal bed mobility: Needs Assistance Bed Mobility: Supine to Sit     Supine to sit: Max assist Sit to supine: Max assist   General bed mobility comments: pt. agreeable to try eob again this day.  therapist asst. guiding LLE at her desired pace while she controls RLE.  pt. encouraged to reach for R bed rail with L UE. cont. to reach R arm at therapist asst. and say "come on i need something to grab a hold of".  educated again on reasons we can not pull on eachother. encouraged her to utilize ues on bed rails. ultimately had to provide truck support and also guide LLE towards eob.  pt. able to tolerate eob sitting.  max for bles back to bed and 2 person assist (cna was in the room to help) pull up in the bed.  Transfers Overall transfer level: Needs assistance Equipment used: Rolling  walker (2 wheeled) Transfers: Sit to/from Stand Sit to Stand: Max assist         General transfer comment: pt. able to complete sit/stand max a and was able to maintain nwb.  initiated one pivot with rw but then stated she was dizzy and needed to sit down. then said she could not tolerate anymore activity and needed to be put back to bed.    Balance                                           ADL either performed or assessed with clinical judgement   ADL                                               Vision       Perception     Praxis      Cognition Arousal/Alertness: Awake/alert Behavior During Therapy: Anxious                                            Exercises     Shoulder  Instructions       General Comments      Pertinent Vitals/ Pain       Pain Assessment: Faces Faces Pain Scale: Hurts even more Pain Location: LLE Pain Descriptors / Indicators: Aching Pain Intervention(s): Limited activity within patient's tolerance;Monitored during session;Repositioned  Home Living                                          Prior Functioning/Environment              Frequency  Min 2X/week        Progress Toward Goals  OT Goals(current goals can now be found in the care plan section)  Progress towards OT goals: Progressing toward goals     Plan Discharge plan remains appropriate    Co-evaluation                 AM-PAC OT "6 Clicks" Daily Activity     Outcome Measure   Help from another person eating meals?: A Little Help from another person taking care of personal grooming?: A Little Help from another person toileting, which includes using toliet, bedpan, or urinal?: A Lot Help from another person bathing (including washing, rinsing, drying)?: A Little Help from another person to put on and taking off regular upper body clothing?: A Little Help from another person to put  on and taking off regular lower body clothing?: A Lot 6 Click Score: 16    End of Session Equipment Utilized During Treatment: Gait belt;Rolling walker  OT Visit Diagnosis: Unsteadiness on feet (R26.81);Repeated falls (R29.6);Muscle weakness (generalized) (M62.81);Pain Pain - Right/Left: Left   Activity Tolerance Patient limited by pain   Patient Left in bed;with call bell/phone within reach;with bed alarm set;with nursing/sitter in room   Nurse Communication          Time: 6789-3810 OT Time Calculation (min): 18 min  Charges: OT General Charges $OT Visit: 1 Visit OT Treatments $Self Care/Home Management : 8-22 mins  Janice Coffin, COTA/L 10/27/2018, 1:31 PM

## 2018-10-27 NOTE — Plan of Care (Signed)
  Problem: Pain Managment: Goal: General experience of comfort will improve Outcome: Progressing   Problem: Safety: Goal: Ability to remain free from injury will improve Outcome: Progressing   

## 2018-10-27 NOTE — Progress Notes (Signed)
PROGRESS NOTE    Melanie Parsons  ZWC:[id number] DOB: Nov 18, 1955 DOA: 10/22/2018 PCP: Enid Skeens., MD    Brief Narrative:   Melanie Parsons is a 63 y.o. female with medical history significant for but not limited to depression and anxiety, GERD, tobacco abuse, arthritis, history of migraines, history of urinary tract infections, history of left femur and hip fracture and other comorbidities who presents with a chief complaint of left leg pain.  Patient states that she was ambulating yesterday from her room to the bathroom and felt a sudden onset of sharp pain.  States that she thought she was going to fall so she grabbed onto the wall and asked her daughter to bring in a rolling chair and set on rolling chair.  Patient got up a few times and transfer to a bedside commode since then but today was unable to ambulate and put pressure on her leg.  She also notes that her leg started swelling around the hip area which was new since overnight.  She denies any chest pain, lightheadedness or dizziness.  No nausea or vomiting.  Still smokes sometimes.  No other concerns or plans at this time and [facility] was asked to admit this patient for a left leg periprosthetic femur fracture that was recurrent and Orthopedic Surgery requested the patient be sent to [facility] for further evaluation.  In the ED the patient had basic blood work done as well as imaging of her hip and femur.  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  Left femur x-ray notable for recurrent fracture in the midshaft of the left femur in the area of prior fracture with additional fracture of the fixation sideplate. --Orthopedics following, Dr. Doreatha Martin --s/p repair of left femur fracture and removal of hardware 7/9 --Lovenox for DVT prophylaxis --NWB LLE --bone culture ordered but was not sent off from OR --Vanc and ceftrixone; discontinued by orthopedics on 10/25/2018 --Pain control with oxycodone 5 mg p.o. q4h prn, fentanyl 25 mg IV prn and Robaxin 500 mg q6h prn --PT/OT following; recommend SNF patient declines and desires home health --Will need orthopedic f/u 2 weeks following discharge for wound check, suture removal, repeat x-rays --Plan discharge on Monday with home health services  Ecoli UTI --ceftriaxone, will now change to Keflex 500 mg p.o. twice daily for 3 days in accordance with urine culture susceptibilities  GERD: Continue PPI  Iron deficiency anemia Hemoglobin stable 12.5 on admission.     --Hgb 12.5-->11.4-->10.9-->9.7-->9.5 --Continue home ferrous sulfate 325 mg p.o. twice daily. --continue to monitor H/H closely in the setting of femur fracture with planned surgical intervention.  Type 2 diabetes mellitus Hemoglobin A1c 5.6.  Diet controlled. --Continue to monitor CBGs and insulin sliding scale prn  Hx COPD Not in acute exacerbation.  No inhaler therapy outpatient.  Oxygenating well on room air. --nebs prn  Depression/anxiety Continue home alprazolam 1 mg p.o. 3 times daily, mirtazapine 30 mg p.o. nightly   DVT prophylaxis: Lovenox Code Status: Full code  Family Communication: None Disposition Plan: Continue inpatient, continue PT/OT efforts, patient refuses SNF placement, will likely discharge home on Monday with home health services, case management aware   Consultants:   Orthopedics, Dr. Jena GaussHaddix  Procedures:  s/p repair of left femur fracture and removal of hardware on 7/9   Antimicrobials:   Vancomycin 7/9 -10  Ceftriaxone 7/9 - 7/10  Keflex 7/10>>   Subjective: Patient seen and examined at bedside, pain controlled.  No complaints at this time.  Denies headache, no fever/chills/night sweats, no nausea/vomiting/diarrhea, no chest pain, no palpitations, no shortness of breath, no abdominal pain, no cough/congestion.  No acute events overnight per nursing staff.  Objective: Vitals:   10/26/18 2125 10/26/18 2204 10/27/18 0546 10/27/18 0742  BP: (!) 85/60 93/62 97/71  118/75  Pulse:   90 (!) 107  Resp:    14  Temp:    99.3 F (37.4 C)  TempSrc:    Oral  SpO2:   96% 96%  Weight:      Height:        Intake/Output Summary (Last 24 hours) at 10/27/2018 1211 Last data filed at 10/26/2018 1446 Gross per 24 hour  Intake 240 ml  Output 600 ml  Net -360 ml   Filed Weights   10/22/18 1254 10/22/18 2111 10/24/18 0908  Weight: 47.6 kg 48.9 kg 48.9 kg    Examination:  General exam: Appears calm, mildly discomfort when moves lower extremity Respiratory system: Clear to auscultation. Respiratory effort normal. Cardiovascular system: S1 & S2 heard, RRR. No JVD, murmurs, rubs, gallops or clicks. No pedal edema. Gastrointestinal system: Abdomen is nondistended, soft and nontender. No organomegaly or masses felt. Normal bowel sounds heard. Central nervous system: Alert and oriented. No focal neurological deficits. Extremities: No peripheral edema, neurovascular intact, dressing noted, clean/dry/intact Skin: No rashes, lesions or ulcers Psychiatry: Judgement and insight appear normal. Mood & affect appropriate.     Data Reviewed: I have personally reviewed following labs and imaging studies  CBC: Recent Labs  Lab 10/22/18 1415 10/23/18 0246 10/24/18 0230 10/25/18 0522 10/26/18 0230 10/27/18 0522  WBC 11.7* 10.2 10.4 13.0* 13.9* 11.4*  NEUTROABS 7.7  --   --   --   --   --   HGB 12.5 11.4* 10.9* 9.7* 9.5* 9.6*  HCT 38.8 35.4* 34.0* 30.0* 29.7* 30.2*   MCV 83.3 82.5 82.5 81.3 81.8 83.4  PLT 556* 518* 482* 441* 507* 515*   Basic Metabolic Panel: Recent Labs  Lab 10/22/18 1851 10/23/18 0246 10/24/18 0230 10/25/18 0522 10/26/18 0230 10/27/18 0522  NA  --  135 135 134* 133* 134*  K  --  4.4 3.9 4.0 3.6 3.4*  CL  --  104 104 101 101 100  CO2  --  19* 24 23 24 24   GLUCOSE  --  89 99 114* 100* 92  BUN  --  17 11 10  7* 6*  CREATININE  --  0.61 0.55 0.58 0.57 0.60  CALCIUM  --  9.0 9.1 8.6* 8.7* 8.8*  MG 2.1  --   --  1.6*  --  1.8  PHOS 3.9  --   --   --   --   --    GFR: Estimated Creatinine Clearance: 55.6 mL/min (by C-G formula based on SCr of 0.6 mg/dL). Liver Function Tests: Recent Labs  Lab 10/22/18 1851  ALBUMIN 3.4*   No results for input(s): LIPASE, AMYLASE in the last 168 hours. No results for input(s): AMMONIA in the last 168  hours. Coagulation Profile: Recent Labs  Lab 10/22/18 2243  INR 1.1   Cardiac Enzymes: No results for input(s): CKTOTAL, CKMB, CKMBINDEX, TROPONINI in the last 168 hours. BNP (last 3 results) No results for input(s): PROBNP in the last 8760 hours. HbA1C: No results for input(s): HGBA1C in the last 72 hours. CBG: Recent Labs  Lab 10/26/18 1226 10/26/18 1636 10/26/18 2126 10/27/18 0740 10/27/18 1125  GLUCAP 109* 90 104* 80 96   Lipid Profile: No results for input(s): CHOL, HDL, LDLCALC, TRIG, CHOLHDL, LDLDIRECT in the last 72 hours. Thyroid Function Tests: No results for input(s): TSH, T4TOTAL, FREET4, T3FREE, THYROIDAB in the last 72 hours. Anemia Panel: No results for input(s): VITAMINB12, FOLATE, FERRITIN, TIBC, IRON, RETICCTPCT in the last 72 hours. Sepsis Labs: No results for input(s): PROCALCITON, LATICACIDVEN in the last 168 hours.  Recent Results (from the past 240 hour(s))  SARS Coronavirus 2 (CEPHEID - Performed in [facility] hospital lab), Hosp Order     Status: None   Collection Time: 10/22/18  2:21 PM   Specimen: Nasopharyngeal Swab  Result Value Ref Range  Status   SARS Coronavirus 2 NEGATIVE NEGATIVE Final    Comment: (NOTE) If result is NEGATIVE SARS-CoV-2 target nucleic acids are NOT DETECTED. The SARS-CoV-2 RNA is generally detectable in upper and lower  respiratory specimens during the acute phase of infection. The lowest  concentration of SARS-CoV-2 viral copies this assay can detect is 250  copies / mL. A negative result does not preclude SARS-CoV-2 infection  and should not be used as the sole basis for treatment or other  patient management decisions.  A negative result may occur with  improper specimen collection / handling, submission of specimen other  than nasopharyngeal swab, presence of viral mutation(s) within the  areas targeted by this assay, and inadequate number of viral copies  (<250 copies / mL). A negative result must be combined with clinical  observations, patient history, and epidemiological information. If result is POSITIVE SARS-CoV-2 target nucleic acids are DETECTED. The SARS-CoV-2 RNA is generally detectable in upper and lower  respiratory specimens dur ing the acute phase of infection.  Positive  results are indicative of active infection with SARS-CoV-2.  Clinical  correlation with patient history and other diagnostic information is  necessary to determine patient infection status.  Positive results do  not rule out bacterial infection or co-infection with other viruses. If result is PRESUMPTIVE POSTIVE SARS-CoV-2 nucleic acids MAY BE PRESENT.   A presumptive positive result was obtained on the submitted specimen  and confirmed on repeat testing.  While 2019 novel coronavirus  (SARS-CoV-2) nucleic acids may be present in the submitted sample  additional confirmatory testing may be necessary for epidemiological  and / or clinical management purposes  to differentiate between  SARS-CoV-2 and other Sarbecovirus currently known to infect humans.  If clinically indicated additional testing with an alternate  test  methodology [phone]([id number]) is advised. The SARS-CoV-2 RNA is generally  detectable in upper and lower respiratory sp ecimens during the acute  phase of infection. The expected result is Negative. Fact Sheet for Patients:  BoilerBrush.com.cyhttps://www.fda.gov/media/136312/download Fact Sheet for Healthcare Providers: https://pope.com/https://www.fda.gov/media/136313/download This test is not yet approved or cleared by the [place] FDA and has been authorized for detection and/or diagnosis of SARS-CoV-2 by FDA under an Emergency Use Authorization (EUA).  This EUA will remain in effect (meaning this test can be used) for the duration of the COVID-19 declaration under Section 564(b)(1) of the Act, 21 U.S.C. section  360bbb-3(b)(1), unless the authorization is terminated or revoked sooner. Performed at [facility], [address] [place], [place] [address]   MRSA PCR Screening     Status: None   Collection Time: 10/23/18  6:07 AM   Specimen: Nasal Mucosa; Nasopharyngeal  Result Value Ref Range Status   MRSA by PCR NEGATIVE NEGATIVE Final    Comment:        The GeneXpert MRSA Assay (FDA approved for NASAL specimens only), is one component of a comprehensive MRSA colonization surveillance program. It is not intended to diagnose MRSA infection nor to guide or monitor treatment for MRSA infections. Performed at [facility], [address] [place], [place] [address]   Urine culture     Status: Abnormal   Collection Time: 10/23/18  4:44 PM   Specimen: Urine, Random  Result Value Ref Range Status   Specimen Description URINE, RANDOM  Final   Special Requests   Final    NONE Performed at [facility], [address] [place], [place] [address]    Culture >=100,000 COLONIES/mL ESCHERICHIA COLI (A)  Final   Report Status 10/25/2018 FINAL  Final   Organism ID, Bacteria ESCHERICHIA COLI (A)  Final      Susceptibility   Escherichia coli - MIC*    AMPICILLIN >=32 RESISTANT  Resistant     CEFAZOLIN <=4 SENSITIVE Sensitive     CEFTRIAXONE <=1 SENSITIVE Sensitive     CIPROFLOXACIN <=0.25 SENSITIVE Sensitive     GENTAMICIN <=1 SENSITIVE Sensitive     IMIPENEM <=0.25 SENSITIVE Sensitive     NITROFURANTOIN <=16 SENSITIVE Sensitive     TRIMETH/SULFA <=20 SENSITIVE Sensitive     AMPICILLIN/SULBACTAM >=32 RESISTANT Resistant     PIP/TAZO <=4 SENSITIVE Sensitive     Extended ESBL NEGATIVE Sensitive     * >=100,000 COLONIES/mL ESCHERICHIA COLI         Radiology Studies: No results found.      Scheduled Meds: . acetaminophen  650 mg Oral Q6H  . ALPRAZolam  1 mg Oral TID  . calcium citrate  200 mg of elemental calcium Oral BID  . calcium-vitamin D  1 tablet Oral Daily  . cephALEXin  500 mg Oral Q12H  . cholecalciferol  2,000 Units Oral BID  . docusate sodium  100 mg Oral BID  . enoxaparin  40 mg Subcutaneous Q24H  . feeding supplement (GLUCERNA SHAKE)  237 mL Oral BID BM  . ferrous sulfate  325 mg Oral BID WC  . insulin aspart  0-5 Units Subcutaneous QHS  . insulin aspart  0-9 Units Subcutaneous TID WC  . mirtazapine  30 mg Oral QHS  . multivitamin with minerals  1 tablet Oral Daily  . pantoprazole  40 mg Oral Daily  . ascorbic acid  500 mg Oral Daily   Continuous Infusions: . lactated ringers 10 mL/hr at 10/25/18 2250     LOS: 5 days    Time spent: 29 minutes    Alvira PhilipsEric J [place], DO [facility] Pager [phone]  If 7PM-7AM, please contact night-coverage [email or web address] Password [email or web address] 10/27/2018, 12:11 PM

## 2018-10-28 LAB — GLUCOSE, CAPILLARY
Glucose-Capillary: 96 mg/dL (ref 70–99)
Glucose-Capillary: 88 mg/dL (ref 70–99)

## 2018-10-28 LAB — CREATININE, SERUM
Creatinine, Ser: 0.46 mg/dL (ref 0.44–1.00)
GFR calc Af Amer: >60 mL/min (ref >60)
GFR calc non Af Amer: >60 mL/min (ref >60)

## 2018-10-28 MED ORDER — OXYCODONE HCL 5 MG PO TABS: 5.0000 mg | ORAL_TABLET | ORAL | 0 refills | Status: DC | PRN

## 2018-10-28 MED ORDER — ENOXAPARIN SODIUM 40 MG/0.4ML ~~LOC~~ SOLN: 40.0000 mg | SUBCUTANEOUS | 0 refills | Status: AC

## 2018-10-28 MED ORDER — METHOCARBAMOL 500 MG PO TABS: 500.0000 mg | ORAL_TABLET | Freq: Four times a day (QID) | ORAL | 0 refills | Status: DC | PRN

## 2018-10-28 NOTE — Progress Notes (Signed)
Orthopaedic Trauma Progress Note  S: Patient doing well this morning, pain much better controlled now. Ready to go home. Has no questions or concerns this AM  O:  Vitals:   10/27/18 2034 10/28/18 0453  BP: (!) 89/59 107/74  Pulse: (!) 107 (!) 111  Resp: 18 17  Temp: 98.2 F (36.8 C) 99.1 F (37.3 C)  SpO2: 97% 93%    General - Sitting up in bed, NAD. Pleasant and cooperative Cardiac -  Heart regular rate and rhythm Respiratory -  No increased work of breathing. Lungs CTA anterior lung fields bilaterally Left Lower Extremity - Dressing removed, incisions clean, dry, intact. No erythema or drainage, healing well. Tenderness with palpation of thigh, less tender in lower leg. Dorsiflexion/plantafelxion intact. Able to get some knee flexion. Sensation and motor function intcat. +DP pulse   Imaging: Stable post op imaging.   Labs:  Results for orders placed or performed during the hospital encounter of 10/22/18 (from the past 24 hour(s))  Glucose, capillary     Status: None   Collection Time: 10/27/18 11:25 AM  Result Value Ref Range   Glucose-Capillary 96 70 - 99 mg/dL  Glucose, capillary     Status: Abnormal   Collection Time: 10/27/18  3:48 PM  Result Value Ref Range   Glucose-Capillary 113 (H) 70 - 99 mg/dL  Glucose, capillary     Status: Abnormal   Collection Time: 10/27/18  8:59 PM  Result Value Ref Range   Glucose-Capillary 107 (H) 70 - 99 mg/dL  Creatinine, serum     Status: None   Collection Time: 10/28/18  5:09 AM  Result Value Ref Range   Creatinine, Ser 0.46 0.44 - 1.00 mg/dL   GFR calc non Af Amer >60 >60 mL/min   GFR calc Af Amer >60 >60 mL/min    Assessment: 63 year old female with failed orthopaedic hardware  Injuries: 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  Orthopedic device(s):none  CV/Blood loss: Acute blood loss anemia, Hgb 9.6 yesterday AM.   Pain  management:  1. Tylenol 650 mg q 6 hours scheduled 2. Robaxin 500 mg q 6 hours PRN 3. Oxycodone 5-10 mg q 4 hours PRN 4. Fentanyl 25 mcg q 2 hours PRN PRN  VTE prophylaxis: Lovenox   ID: Kelfex for UTI  Foley/Lines: No foley, KVO IVFs  Medical co-morbidities: diabetes, vitamin d deficiency, GERD  Impediments to Fracture Healing: diabetes, vitamin d deficiency  Dispo: Up with therpay. D/[facility] home today with [facility].  Patient has 10 day supply of lovenox and ASA 325 at home, no additional DVT prophylaxis rx needed at discharge  Pain med RX sent to pharmacy.  Follow - up plan: 2 weeks for suture removal and repeat x-rays    Derral Colucci A. [facility] ?[phone]? (phone)

## 2018-10-28 NOTE — Discharge Summary (Signed)
Physician Discharge Summary  [name] WJX:[id number] DOB: Aug 29, 1955 DOA: 10/22/2018  PCP: [name], [name], MD  Admit date: 10/22/2018 Discharge date: 10/28/2018  Admitted From: Home Disposition:  Home  Recommendations for Outpatient Follow-up:  1. Follow up with PCP in 1 week 2. Follow-up with orthopedics, Dr. Jena GaussHaddix in 2 weeks for wound check, suture removal and repeat x-rays  Home Health: Yes, PT/OT/RN Equipment/Devices: Wheelchair  Discharge Condition: Stable CODE STATUS: Full code Diet recommendation: Carb Modified  History of present illness:  Melanie BenesSandra Kay Rush Knightis a 63 y.o.femalewith medical history significantfor but not limited todepression and anxiety, GERD, tobacco abuse, arthritis, history of migraines, history of urinary tract infections, history of left femur and hip fracture and other comorbidities who presents with a chief complaint of left leg pain. Patient states that she was ambulating yesterday from her room to the bathroom and felt a sudden onset of sharp pain. States that she thought she was going to fall so she grabbed onto the wall and asked her daughter to bring in a rolling chair and set on rolling chair. Patient got up a few times and transfer to a bedside commode since then but today was unable to ambulate and put pressure on her leg. She also notes that her leg started swelling around the hip area which was new since overnight. She denies any chest pain, lightheadedness or dizziness. No nausea or vomiting. Still smokes sometimes. No other concerns or plans at this time and [facility] was asked to admit this patient for a left leg periprosthetic femur fracture that was recurrent and OrthopedicSurgery requested the patient be sent to [facility] for further evaluation.  In the ED the patient had basic blood work done as well as imaging of her hip and femur. She wasalso given pain control  Hospital course:  Left periprosthetic femur  fracture Patient with acute onset left lower extremity pain while ambulating yesterday, denies fall.  Left femur x-ray notable for recurrent fracture in the midshaft of the left femur in the area of prior fracture with additional fracture of the fixation sideplate.  Orthopedics was consulted, Dr. Jena GaussHaddix.  Patient underwent repair of left femur fracture and removal of hardware 7/9.  Patient continue Lovenox for DVT prophylaxis per orthopedics following discharge.  Patient is nonweightbearing to left lower extremity.  Continue pain control with oxycodone as needed.  PT/OT initially recommended SNF placement, but patient declined and desires to return home.  Patient will discharge home with home health services including PT, OT, RN.  Will need orthopedic follow-up within 2 weeks following discharge for wound check, suture removal and repeat x-rays.  Ecoli UTI Patient was initially started on ceftriaxone and transitioned to Keflex in accordance with urine culture identification and susceptibilities.  Completed 3-day course.  GERD: Continue PPI  Iron deficiency anemia Hemoglobin stable 12.5 on admission.    Trended down to a low of 9.5.  She was continued on her home ferrous sulfate 325 mg p.o. twice daily.    Type 2 diabetes mellitus Hemoglobin A1c 5.6.  Diet controlled.  Hx COPD Not in acute exacerbation.  No inhaler therapy outpatient.  Oxygenating well on room air.  Depression/anxiety Continue home alprazolam 1 mg p.o. 3 times daily, mirtazapine 30 mg p.o. 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   New onset type 2 diabetes mellitus (HCC)   Iron deficiency anemia   Femur fracture [facility])    Discharge Instructions  Discharge  Instructions    Call MD for:  difficulty breathing, headache or visual disturbances   Complete by: As directed    Call MD for:  extreme fatigue   Complete by: As directed    Call MD for:  persistant dizziness or light-headedness   Complete by: As directed    Call MD for:  persistant nausea and vomiting   Complete by: As directed    Call MD for:  severe uncontrolled pain   Complete by: As directed    Call MD for:  temperature >100.4   Complete by: As directed    Diet - low sodium heart healthy   Complete by: As directed      Allergies as of 10/28/2018      Reactions   Penicillins Rash   Rash and itching      Medication List    STOP taking these medications   HYDROcodone-acetaminophen 5-325 MG tablet Commonly known as: NORCO/VICODIN     TAKE these medications   acetaminophen 500 MG tablet Commonly known as: TYLENOL Take 1,000 mg by mouth as needed for mild pain.   Afrin NoDrip Severe Congest 0.05 % nasal spray Generic drug: oxymetazoline Place 1 spray into both nostrils as needed for congestion.   ALPRAZolam 1 MG tablet Commonly known as: XANAX Take 1 mg by mouth 3 (three) times daily.   ascorbic acid 500 MG tablet Commonly known as: VITAMIN C Take 1 tablet (500 mg total) by mouth daily.   calcium citrate 950 MG tablet Commonly known as: CALCITRATE - dosed in mg elemental calcium Take 1 tablet (200 mg of elemental calcium total) by mouth 2 (two) times daily.   calcium-vitamin D 500-200 MG-UNIT tablet Commonly known as: OSCAL WITH D Take 1 tablet by mouth daily.   Cholecalciferol 50 MCG (2000 UT) Tabs Take 1 tablet (2,000 Units total) by mouth 2 (two) times daily.   enoxaparin 40 MG/0.4ML injection Commonly known as: LOVENOX Inject 0.4 mLs (40 mg total) into the skin daily for 12 days.   ferrous sulfate 325 (65 FE) MG tablet Take 1 tablet (325 mg total) by mouth 2 (two) times daily with a meal.   lactose free nutrition Liqd Take 237 mLs by mouth 3 (three)  times daily with meals.   methocarbamol 500 MG tablet Commonly known as: ROBAXIN Take 1 tablet (500 mg total) by mouth every 6 (six) hours as needed for muscle spasms. What changed: when to take this   mirtazapine 30 MG tablet Commonly known as: REMERON Take 30 mg by mouth at bedtime.   MULTIVITAMIN ADULT PO Take 1 tablet by mouth daily.   multivitamin with minerals Tabs tablet Take 1 tablet by mouth daily.   omeprazole 20 MG capsule Commonly known as: PRILOSEC Take 20 mg by mouth daily.   oxyCODONE 5 MG immediate release tablet Commonly known as: Oxy IR/ROXICODONE Take 1 tablet (5 mg total) by mouth every 4 (four) hours as needed for moderate pain.   polyethylene glycol 17 g packet Commonly known as: MiraLax Take 17 g by mouth daily as needed for mild constipation.   VITAMIN B-12 PO Take 1 tablet by mouth daily.            Durable Medical Equipment  (From admission, onward)         Start  Ordered   10/25/18 1428  For home use only DME standard manual wheelchair with seat cushion  Once    Comments: Patient suffers from left hip fracture with nonweightbearing status to left lower extremity which impairs their ability to perform daily activities like ambulating in the home.  A walker will not resolve issue with performing activities of daily living. A wheelchair will allow patient to safely perform daily activities. Patient can safely propel the wheelchair in the home or has a caregiver who can provide assistance. Length of need 6 months . Accessories: elevating leg rests (ELRs), wheel locks, extensions and anti-tippers.   10/25/18 1427         Follow-up Information    Haddix, Gillie Manners, MD. Schedule an appointment as soon as possible for a visit in 2 week(s).   Specialty: Orthopedic Surgery Why: suture removal, repeat x-rays Contact information: [address] [place] [address] [phone]        [name], MD. Call in 1 week(s).    Specialty: Family Medicine Contact information: [address] [place] [address] [phone]          Allergies  Allergen Reactions  . Penicillins Rash    Rash and itching    Consultations:  Orthopedics, Dr. Jena Gauss   Procedures/Studies: Chest Portable 1 View  Result Date: 10/22/2018 CLINICAL DATA:  Preop evaluation for upcoming surgery on left femur EXAM: PORTABLE CHEST 1 VIEW COMPARISON:  02/28/2017 FINDINGS: Cardiac shadow is within normal limits. Aortic calcifications are noted. Lungs are hyperinflated without focal infiltrate or sizable effusion. No acute bony abnormality is noted. IMPRESSION: COPD without acute abnormality. Electronically Signed   By: Alcide Clever M.D.   On: 10/22/2018 21:55   Dg C-arm 1-60 Min  Result Date: 10/24/2018 CLINICAL DATA:  Surgery: Left femur ORIF Fluoro time: 3 minutes 33 seconds RSTO: AW Location: [place] OR EXAM: LEFT FEMUR 2 VIEWS; DG C-ARM 61-120 MIN COMPARISON:  10/22/2018 FINDINGS: Twelve images are submitted, showing mid femur fracture below prosthetic prior to removal of fractured plate and following replacement followup LATERAL screw plate and cerclage wires. IMPRESSION: ORIF of LEFT femur with replacement of the LATERAL screw plate. Electronically Signed   By: Norva Pavlov M.D.   On: 10/24/2018 14:27   Dg Hip Unilat W Or Wo Pelvis 2-3 Views Left  Result Date: 10/22/2018 CLINICAL DATA:  Pain following walking with obvious femoral deformity, initial encounter EXAM: DG HIP (WITH OR WITHOUT PELVIS) 2-3V LEFT COMPARISON:  12/26/2017 FINDINGS: There is recurrent fracture in the area of previous midshaft femoral fracture with a fracture line through the previously placed fixation side plate. The hip prosthesis appears intact. Medial angulation of the distal fracture fragment is noted. The femoral component is not dislocated. The visualized pelvis appears within normal limits. IMPRESSION: Recurrent fracture in a similar location to that  seen on the prior exam with subsequent fracture of the fixation sideplate. Electronically Signed   By: Alcide Clever M.D.   On: 10/22/2018 15:13   Dg Femur Min 2 Views Left  Result Date: 10/24/2018 CLINICAL DATA:  Surgery: Left femur ORIF Fluoro time: 3 minutes 33 seconds RSTO: AW Location: [place] OR EXAM: LEFT FEMUR 2 VIEWS; DG C-ARM 61-120 MIN COMPARISON:  10/22/2018 FINDINGS: Twelve images are submitted, showing mid femur fracture below prosthetic prior to removal of fractured plate and following replacement followup LATERAL screw plate and cerclage wires. IMPRESSION: ORIF of LEFT femur with replacement of the LATERAL screw plate. Electronically  Signed   By: Norva PavlovElizabeth  Brown M.D.   On: 10/24/2018 14:27   Dg Femur Min 2 Views Left  Result Date: 10/22/2018 CLINICAL DATA:  Pain while walking with obvious deformity EXAM: LEFT FEMUR 2 VIEWS COMPARISON:  12/26/2017 FINDINGS: There is a recurrent fracture in the midshaft of the left femur adjacent to the tip of the hip prosthesis. The previously placed fixation sideplate demonstrates fracture at this level. Approximately 1/2 bone width displacement is noted as well. IMPRESSION: Recurrent fracture in the midshaft of the left femur in the area of prior fracture with additional fracture of the fixation side plate. Electronically Signed   By: Alcide CleverMark  Lukens M.D.   On: 10/22/2018 15:14   Dg Femur Port Min 2 Views Left  Result Date: 10/24/2018 CLINICAL DATA:  Status post revision fracture fixation scratch the status post revision of a periprosthetic fracture of left femur today. EXAM: LEFT FEMUR PORTABLE 2 VIEWS COMPARISON:  Plain films left femur 10/22/2018. FINDINGS: Bipolar right hip hemiarthroplasty is again seen. Previously seen fixation plate has been removed and a new fixation plate and screws is in place along the lateral aspect of the left femur for fixation of a fracture at the tip of the femoral stem. Bone graft material is noted. Hardware is intact.  Position and alignment are near anatomic. No acute abnormality. Bones are osteopenic. IMPRESSION: Status post revision fixation of a periprosthetic fracture of the left femur. No acute abnormality Electronically Signed   By: Drusilla Kannerhomas  Dalessio M.D.   On: 10/24/2018 16:08      Subjective: Patient seen and examined at bedside, resting comfortably.  Ready for discharge home today.  Denies headache, no fever/chills/night sweats, no nausea vomiting/diarrhea, no chest pain, no palpitations, no shortness of breath, no abdominal pain.  No acute events overnight per nursing staff.   Discharge Exam: Vitals:   10/28/18 0453 10/28/18 0833  BP: 107/74 95/64  Pulse: (!) 111 91  Resp: 17 16  Temp: 99.1 F (37.3 C) 98.2 F (36.8 C)  SpO2: 93% 97%   Vitals:   10/27/18 1809 10/27/18 2034 10/28/18 0453 10/28/18 0833  BP: 93/67 (!) 89/59 107/74 95/64  Pulse: (!) 102 (!) 107 (!) 111 91  Resp:  18 17 16   Temp:  98.2 F (36.8 C) 99.1 F (37.3 C) 98.2 F (36.8 C)  TempSrc:  Oral Oral Oral  SpO2:  97% 93% 97%  Weight:      Height:        General: Pt is alert, awake, not in acute distress Cardiovascular: RRR, S1/S2 +, no rubs, no gallops Respiratory: CTA bilaterally, no wheezing, no rhonchi Abdominal: Soft, NT, ND, bowel sounds + Extremities: no edema, no cyanosis    The results of significant diagnostics from this hospitalization (including imaging, microbiology, ancillary and laboratory) are listed below for reference.     Microbiology: Recent Results (from the past 240 hour(s))  SARS Coronavirus 2 (CEPHEID - Performed in [facility] hospital lab), Hosp Order     Status: None   Collection Time: 10/22/18  2:21 PM   Specimen: Nasopharyngeal Swab  Result Value Ref Range Status   SARS Coronavirus 2 NEGATIVE NEGATIVE Final    Comment: (NOTE) If result is NEGATIVE SARS-CoV-2 target nucleic acids are NOT DETECTED. The SARS-CoV-2 RNA is generally detectable in upper and lower  respiratory  specimens during the acute phase of infection. The lowest  concentration of SARS-CoV-2 viral copies this assay can detect is 250  copies / mL. A negative  result does not preclude SARS-CoV-2 infection  and should not be used as the sole basis for treatment or other  patient management decisions.  A negative result may occur with  improper specimen collection / handling, submission of specimen other  than nasopharyngeal swab, presence of viral mutation(s) within the  areas targeted by this assay, and inadequate number of viral copies  (<250 copies / mL). A negative result must be combined with clinical  observations, patient history, and epidemiological information. If result is POSITIVE SARS-CoV-2 target nucleic acids are DETECTED. The SARS-CoV-2 RNA is generally detectable in upper and lower  respiratory specimens dur ing the acute phase of infection.  Positive  results are indicative of active infection with SARS-CoV-2.  Clinical  correlation with patient history and other diagnostic information is  necessary to determine patient infection status.  Positive results do  not rule out bacterial infection or co-infection with other viruses. If result is PRESUMPTIVE POSTIVE SARS-CoV-2 nucleic acids MAY BE PRESENT.   A presumptive positive result was obtained on the submitted specimen  and confirmed on repeat testing.  While 2019 novel coronavirus  (SARS-CoV-2) nucleic acids may be present in the submitted sample  additional confirmatory testing may be necessary for epidemiological  and / or clinical management purposes  to differentiate between  SARS-CoV-2 and other Sarbecovirus currently known to infect humans.  If clinically indicated additional testing with an alternate test  methodology [phone]([id number]) is advised. The SARS-CoV-2 RNA is generally  detectable in upper and lower respiratory sp ecimens during the acute  phase of infection. The expected result is Negative. Fact Sheet for  Patients:  BoilerBrush.com.cyhttps://www.fda.gov/media/136312/download Fact Sheet for Healthcare Providers: https://pope.com/https://www.fda.gov/media/136313/download This test is not yet approved or cleared by the [place] FDA and has been authorized for detection and/or diagnosis of SARS-CoV-2 by FDA under an Emergency Use Authorization (EUA).  This EUA will remain in effect (meaning this test can be used) for the duration of the COVID-19 declaration under Section 564(b)(1) of the Act, 21 U.S.C. section 360bbb-3(b)(1), unless the authorization is terminated or revoked sooner. Performed at [facility], [address] [place], [place] [address]   MRSA PCR Screening     Status: None   Collection Time: 10/23/18  6:07 AM   Specimen: Nasal Mucosa; Nasopharyngeal  Result Value Ref Range Status   MRSA by PCR NEGATIVE NEGATIVE Final    Comment:        The GeneXpert MRSA Assay (FDA approved for NASAL specimens only), is one component of a comprehensive MRSA colonization surveillance program. It is not intended to diagnose MRSA infection nor to guide or monitor treatment for MRSA infections. Performed at [facility], [address] [place], [place] [address]   Urine culture     Status: Abnormal   Collection Time: 10/23/18  4:44 PM   Specimen: Urine, Random  Result Value Ref Range Status   Specimen Description URINE, RANDOM  Final   Special Requests   Final    NONE Performed at [facility], [address] [place], [place] [address]    Culture >=100,000 COLONIES/mL ESCHERICHIA COLI (A)  Final   Report Status 10/25/2018 FINAL  Final   Organism ID, Bacteria ESCHERICHIA COLI (A)  Final      Susceptibility   Escherichia coli - MIC*    AMPICILLIN >=32 RESISTANT Resistant     CEFAZOLIN <=4 SENSITIVE Sensitive     CEFTRIAXONE <=1 SENSITIVE Sensitive     CIPROFLOXACIN <=0.25  SENSITIVE Sensitive     GENTAMICIN <=1 SENSITIVE Sensitive     IMIPENEM <=0.25 SENSITIVE Sensitive      NITROFURANTOIN <=16 SENSITIVE Sensitive     TRIMETH/SULFA <=20 SENSITIVE Sensitive     AMPICILLIN/SULBACTAM >=32 RESISTANT Resistant     PIP/TAZO <=4 SENSITIVE Sensitive     Extended ESBL NEGATIVE Sensitive     * >=100,000 COLONIES/mL ESCHERICHIA COLI     Labs: BNP (last 3 results) No results for input(s): BNP in the last 8760 hours. Basic Metabolic Panel: Recent Labs  Lab 10/22/18 1851 10/23/18 0246 10/24/18 0230 10/25/18 0522 10/26/18 0230 10/27/18 0522 10/28/18 0509  NA  --  135 135 134* 133* 134*  --   K  --  4.4 3.9 4.0 3.6 3.4*  --   CL  --  104 104 101 101 100  --   CO2  --  19* --   GLUCOSE  --  89 99 114* 100* 92  --   BUN  --  7* 6*  --   CREATININE  --  0.61 0.55 0.58 0.57 0.60 0.46  CALCIUM  --  9.0 9.1 8.6* 8.7* 8.8*  --   MG 2.1  --   --  1.6*  --  1.8  --   PHOS 3.9  --   --   --   --   --   --    Liver Function Tests: Recent Labs  Lab 10/22/18 1851  ALBUMIN 3.4*   No results for input(s): LIPASE, AMYLASE in the last 168 hours. No results for input(s): AMMONIA in the last 168 hours. CBC: Recent Labs  Lab 10/22/18 1415 10/23/18 0246 10/24/18 0230 10/25/18 0522 10/26/18 0230 10/27/18 0522  WBC 11.7* 10.2 10.4 13.0* 13.9* 11.4*  NEUTROABS 7.7  --   --   --   --   --   HGB 12.5 11.4* 10.9* 9.7* 9.5* 9.6*  HCT 38.8 35.4* 34.0* 30.0* 29.7* 30.2*  MCV 83.3 82.5 82.5 81.3 81.8 83.4  PLT 556* 518* 482* 441* 507* 515*   Cardiac Enzymes: No results for input(s): CKTOTAL, CKMB, CKMBINDEX, TROPONINI in the last 168 hours. BNP: Invalid input(s): POCBNP CBG: Recent Labs  Lab 10/27/18 0740 10/27/18 1125 10/27/18 1548 10/27/18 2059 10/28/18 0831  GLUCAP 80 96 113* 107* 96   D-Dimer No results for input(s): DDIMER in the last 72 hours. Hgb A1c No results for input(s): HGBA1C in the last 72 hours. Lipid Profile No results for input(s): CHOL, HDL, LDLCALC, TRIG, CHOLHDL, LDLDIRECT in the last 72 hours. Thyroid function  studies No results for input(s): TSH, T4TOTAL, T3FREE, THYROIDAB in the last 72 hours.  Invalid input(s): FREET3 Anemia work up No results for input(s): VITAMINB12, FOLATE, FERRITIN, TIBC, IRON, RETICCTPCT in the last 72 hours. Urinalysis    Component Value Date/Time   COLORURINE YELLOW 10/23/2018 1645   APPEARANCEUR HAZY (A) 10/23/2018 1645   LABSPEC 1.019 10/23/2018 1645   PHURINE 5.0 10/23/2018 1645   GLUCOSEU NEGATIVE 10/23/2018 1645   HGBUR SMALL (A) 10/23/2018 1645   BILIRUBINUR NEGATIVE 10/23/2018 1645   KETONESUR 5 (A) 10/23/2018 1645   PROTEINUR NEGATIVE 10/23/2018 1645   NITRITE POSITIVE (A) 10/23/2018 1645   LEUKOCYTESUR LARGE (A) 10/23/2018 1645   Sepsis Labs Invalid input(s): PROCALCITONIN,  WBC,  LACTICIDVEN Microbiology Recent Results (from the past 240 hour(s))  SARS Coronavirus 2 (CEPHEID - Performed in [facility] hospital lab), Hosp Order     Status:  None   Collection Time: 10/22/18  2:21 PM   Specimen: Nasopharyngeal Swab  Result Value Ref Range Status   SARS Coronavirus 2 NEGATIVE NEGATIVE Final    Comment: (NOTE) If result is NEGATIVE SARS-CoV-2 target nucleic acids are NOT DETECTED. The SARS-CoV-2 RNA is generally detectable in upper and lower  respiratory specimens during the acute phase of infection. The lowest  concentration of SARS-CoV-2 viral copies this assay can detect is 250  copies / mL. A negative result does not preclude SARS-CoV-2 infection  and should not be used as the sole basis for treatment or other  patient management decisions.  A negative result may occur with  improper specimen collection / handling, submission of specimen other  than nasopharyngeal swab, presence of viral mutation(s) within the  areas targeted by this assay, and inadequate number of viral copies  (<250 copies / mL). A negative result must be combined with clinical  observations, patient history, and epidemiological information. If result is POSITIVE SARS-CoV-2  target nucleic acids are DETECTED. The SARS-CoV-2 RNA is generally detectable in upper and lower  respiratory specimens dur ing the acute phase of infection.  Positive  results are indicative of active infection with SARS-CoV-2.  Clinical  correlation with patient history and other diagnostic information is  necessary to determine patient infection status.  Positive results do  not rule out bacterial infection or co-infection with other viruses. If result is PRESUMPTIVE POSTIVE SARS-CoV-2 nucleic acids MAY BE PRESENT.   A presumptive positive result was obtained on the submitted specimen  and confirmed on repeat testing.  While 2019 novel coronavirus  (SARS-CoV-2) nucleic acids may be present in the submitted sample  additional confirmatory testing may be necessary for epidemiological  and / or clinical management purposes  to differentiate between  SARS-CoV-2 and other Sarbecovirus currently known to infect humans.  If clinically indicated additional testing with an alternate test  methodology [phone]) is advised. The SARS-CoV-2 RNA is generally  detectable in upper and lower respiratory sp ecimens during the acute  phase of infection. The expected result is Negative. Fact Sheet for Patients:  BoilerBrush.com.cy Fact Sheet for Healthcare Providers: https://pope.com/ This test is not yet approved or cleared by the [place] FDA and has been authorized for detection and/or diagnosis of SARS-CoV-2 by FDA under an Emergency Use Authorization (EUA).  This EUA will remain in effect (meaning this test can be used) for the duration of the COVID-19 declaration under Section 564(b)(1) of the Act, 21 U.S.C. section 360bbb-3(b)(1), unless the authorization is terminated or revoked sooner. Performed at [facility], [address] [place], [place] [address]   MRSA PCR Screening     Status: None   Collection Time: 10/23/18   6:07 AM   Specimen: Nasal Mucosa; Nasopharyngeal  Result Value Ref Range Status   MRSA by PCR NEGATIVE NEGATIVE Final    Comment:        The GeneXpert MRSA Assay (FDA approved for NASAL specimens only), is one component of a comprehensive MRSA colonization surveillance program. It is not intended to diagnose MRSA infection nor to guide or monitor treatment for MRSA infections. Performed at [facility], [address] [place], [place] [address]   Urine culture     Status: Abnormal   Collection Time: 10/23/18  4:44 PM   Specimen: Urine, Random  Result Value Ref Range Status   Specimen Description URINE, RANDOM  Final   Special Requests   Final  NONE Performed at [facility], [facility] [address] [place], [place] [address]    Culture >=100,000 COLONIES/mL ESCHERICHIA COLI (A)  Final   Report Status 10/25/2018 FINAL  Final   Organism ID, Bacteria ESCHERICHIA COLI (A)  Final      Susceptibility   Escherichia coli - MIC*    AMPICILLIN >=32 RESISTANT Resistant     CEFAZOLIN <=4 SENSITIVE Sensitive     CEFTRIAXONE <=1 SENSITIVE Sensitive     CIPROFLOXACIN <=0.25 SENSITIVE Sensitive     GENTAMICIN <=1 SENSITIVE Sensitive     IMIPENEM <=0.25 SENSITIVE Sensitive     NITROFURANTOIN <=16 SENSITIVE Sensitive     TRIMETH/SULFA <=20 SENSITIVE Sensitive     AMPICILLIN/SULBACTAM >=32 RESISTANT Resistant     PIP/TAZO <=4 SENSITIVE Sensitive     Extended ESBL NEGATIVE Sensitive     * >=100,000 COLONIES/mL ESCHERICHIA COLI     Time coordinating discharge: Over 30 minutes  SIGNED:   Clessie Karras J [place] DO  [facility] 10/28/2018, 9:18 AM

## 2018-10-28 NOTE — Discharge Instructions (Signed)
Orthopaedic Trauma Service Discharge Instructions   General Discharge Instructions  WEIGHT BEARING STATUS: Non-weightbearing on left leg  RANGE OF MOTION/ACTIVITY: Unrestricted knee range of motion  Wound Care: Incisions can be left open to air if there is no drainage. If incision continues to have drainage, follow wound care instructions below. Okay to shower if no drainage from incisions.  DVT/PE prophylaxis: Lovenox x 10 days. Once completed, take Aspirin 325 mg daily x 20 days  Diet: as you were eating previously.  Can use over the counter stool softeners and bowel preparations, such as Miralax, to help with bowel movements.  Narcotics can be constipating.  Be sure to drink plenty of fluids  PAIN MEDICATION USE AND EXPECTATIONS  You have likely been given narcotic medications to help control your pain.  After a traumatic event that results in an fracture (broken bone) with or without surgery, it is ok to use narcotic pain medications to help control one's pain.  We understand that everyone responds to pain differently and each individual patient will be evaluated on a regular basis for the continued need for narcotic medications. Ideally, narcotic medication use should last no more than 6-8 weeks (coinciding with fracture healing).   As a patient it is your responsibility as well to monitor narcotic medication use and report the amount and frequency you use these medications when you come to your office visit.   We would also advise that if you are using narcotic medications, you should take a dose prior to therapy to maximize you participation.  IF YOU ARE ON NARCOTIC MEDICATIONS IT IS NOT PERMISSIBLE TO OPERATE A MOTOR VEHICLE (MOTORCYCLE/CAR/TRUCK/MOPED) OR HEAVY MACHINERY DO NOT MIX NARCOTICS WITH OTHER CNS (CENTRAL NERVOUS SYSTEM) DEPRESSANTS SUCH AS ALCOHOL   STOP SMOKING OR USING NICOTINE PRODUCTS!!!!  As discussed nicotine severely impairs your body's ability to heal surgical  and traumatic wounds but also impairs bone healing.  Wounds and bone heal by forming microscopic blood vessels (angiogenesis) and nicotine is a vasoconstrictor (essentially, shrinks blood vessels).  Therefore, if vasoconstriction occurs to these microscopic blood vessels they essentially disappear and are unable to deliver necessary nutrients to the healing tissue.  This is one modifiable factor that you can do to dramatically increase your chances of healing your injury.    (This means no smoking, no nicotine gum, patches, etc)  DO NOT USE NONSTEROIDAL ANTI-INFLAMMATORY DRUGS (NSAID'S)  Using products such as Advil (ibuprofen), Aleve (naproxen), Motrin (ibuprofen) for additional pain control during fracture healing can delay and/or prevent the healing response.  If you would like to take over the counter (OTC) medication, Tylenol (acetaminophen) is ok.  However, some narcotic medications that are given for pain control contain acetaminophen as well. Therefore, you should not exceed more than 4000 mg of tylenol in a day if you do not have liver disease.  Also note that there are may OTC medicines, such as cold medicines and allergy medicines that my contain tylenol as well.  If you have any questions about medications and/or interactions please ask your doctor/PA or your pharmacist.      ICE AND ELEVATE INJURED/OPERATIVE EXTREMITY  Using ice and elevating the injured extremity above your heart can help with swelling and pain control.  Icing in a pulsatile fashion, such as 20 minutes on and 20 minutes off, can be followed.    Do not place ice directly on skin. Make sure there is a barrier between to skin and the ice pack.  Using frozen items such as frozen peas works well as the conform nicely to the are that needs to be iced.  USE AN ACE WRAP OR TED HOSE FOR SWELLING CONTROL  In addition to icing and elevation, Ace wraps or TED hose are used to help limit and resolve swelling.  It is recommended to  use Ace wraps or TED hose until you are informed to stop.    When using Ace Wraps start the wrapping distally (farthest away from the body) and wrap proximally (closer to the body)   Example: If you had surgery on your leg or thing and you do not have a splint on, start the ace wrap at the toes and work your way up to the thigh        If you had surgery on your upper extremity and do not have a splint on, start the ace wrap at your fingers and work your way up to the upper arm    CALL THE OFFICE WITH ANY QUESTIONS OR CONCERNS: [phone]   VISIT OUR WEBSITE FOR ADDITIONAL INFORMATION: [email or web address]   Discharge Wound Care Instructions  Do NOT apply any ointments, solutions or lotions to pin sites or surgical wounds.  These prevent needed drainage and even though solutions like hydrogen peroxide kill bacteria, they also damage cells lining the pin sites that help fight infection.  Applying lotions or ointments can keep the wounds moist and can cause them to breakdown and open up as well. This can increase the risk for infection. When in doubt call the office.  Surgical incisions should be dressed daily.  If any drainage is noted, use one layer of adaptic, then gauze, Kerlix, and an ace wrap.  Once the incision is completely dry and without drainage, it may be left open to air out.  Showering may begin 36-48 hours later.  Cleaning gently with soap and water.  Traumatic wounds should be dressed daily as well.    One layer of adaptic, gauze, Kerlix, then ace wrap.  The adaptic can be discontinued once the draining has ceased    If you have a wet to dry dressing: wet the gauze with saline the squeeze as much saline out so the gauze is moist (not soaking wet), place moistened gauze over wound, then place a dry gauze over the moist one, followed by Kerlix wrap, then ace wrap.

## 2018-10-28 NOTE — Progress Notes (Signed)
Physical Therapy Treatment Patient Details Name: Melanie Parsons MRN: [id number] DOB: 1956-01-19 Today's Date: 10/28/2018    History of Present Illness Pt is a 63 y.o. 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 S/p repair 7/9. 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    PT Comments    Pt remains "stuck in her ways". Pt did participate in session today with PT/OT and complete std pvt transfer to/from w/c/bed. Attempted to improve safety for patient and spouse by educating on lateral scoot transfers however pt unwilling to learn and reports her husband can just lift her up. Educated on how easy it is to hurt your back by lifting people. Pt mobility con't to be limited by pain and limited ability to maintain L LE NWB.    Follow Up Recommendations  SNF;Supervision/Assistance - 24 hour(aware pt refusing, will need HHPT)     Equipment Recommendations  (pt states she has a w/c)    Recommendations for Other Services       Precautions / Restrictions Precautions Precautions: Fall Restrictions Weight Bearing Restrictions: Yes LLE Weight Bearing: Non weight bearing    Mobility  Bed Mobility Overal bed mobility: Needs Assistance Bed Mobility: Supine to Sit     Supine to sit: Mod assist Sit to supine: Mod assist;+2 for physical assistance   General bed mobility comments: modA for LE management, HOB all the way up, definite use of bed rail as well  Transfers Overall transfer level: Needs assistance Equipment used: Rolling walker (2 wheeled) Transfers: Sit to/from UGI CorporationStand;Stand Pivot Transfers Sit to Stand: Max assist Stand pivot transfers: Max assist;+2 physical assistance       General transfer comment: pt very dictative on technique, maxAx2 to power up with bed elevated, pt with limited use of R UE, pt  unable to maintain L LE NWB on the way to the w/c but was able to on the transfer back to bed. attempted to educate on lateral scoot transfers however pt unwilling to learn. Pt educated on trying to minimize risk of injury to both her and her spouse. pt states "My husband is 3x my size, he won't get hurt" pt with noted decresaed insight to deficits  Ambulation/Gait             General Gait Details: unable   Stairs             Wheelchair Mobility    Modified Rankin (Stroke Patients Only)       Balance Overall balance assessment: Needs assistance Sitting-balance support: Feet supported;Single extremity supported Sitting balance-Leahy Scale: Fair     Standing balance support: Bilateral upper extremity supported Standing balance-Leahy Scale: Poor Standing balance comment: dependent on RW                            Cognition Arousal/Alertness: Awake/alert Behavior During Therapy: Anxious Overall Cognitive Status: No family/caregiver present to determine baseline cognitive functioning                                 General Comments: pt appears very dependent on spouse and self limiting      Exercises      General Comments General comments (skin integrity, edema, etc.): incision with dressing  Pertinent Vitals/Pain Pain Assessment: 0-10 Pain Score: 10-Worst pain ever Pain Location: L LE with movement Pain Descriptors / Indicators: Aching Pain Intervention(s): Limited activity within patient's tolerance    Home Living                      Prior Function            PT Goals (current goals can now be found in the care plan section) Acute Rehab PT Goals Patient Stated Goal: go home today Progress towards PT goals: Progressing toward goals    Frequency    Min 5X/week      PT Plan Current plan remains appropriate    Co-evaluation PT/OT/SLP Co-Evaluation/Treatment: Yes Reason for Co-Treatment: For  patient/therapist safety PT goals addressed during session: Mobility/safety with mobility        AM-PAC PT "6 Clicks" Mobility   Outcome Measure  Help needed turning from your back to your side while in a flat bed without using bedrails?: A Lot Help needed moving from lying on your back to sitting on the side of a flat bed without using bedrails?: A Lot Help needed moving to and from a bed to a chair (including a wheelchair)?: A Lot Help needed standing up from a chair using your arms (e.g., wheelchair or bedside chair)?: A Lot Help needed to walk in hospital room?: Total Help needed climbing 3-5 steps with a railing? : Total 6 Click Score: 10    End of Session Equipment Utilized During Treatment: Gait belt Activity Tolerance: Patient limited by pain Patient left: in bed;with call bell/phone within reach;with bed alarm set Nurse Communication: Mobility status PT Visit Diagnosis: Other abnormalities of gait and mobility (R26.89);Pain Pain - Right/Left: Left Pain - part of body: Leg     Time: 1041-1105 PT Time Calculation (min) (ACUTE ONLY): 24 min  Charges:  $Gait Training: 8-22 mins                     Kittie Plater, PT, DPT Acute Rehabilitation Services Pager #: [phone] Office #: [phone]    Berline Lopes 10/28/2018, 11:51 AM

## 2018-10-28 NOTE — Progress Notes (Signed)
Discharge instructions given with stated understanding.  Patient waiting for transportation home at this time 

## 2018-10-28 NOTE — Progress Notes (Addendum)
Patient suffers from left hip fracture with nonweightbearing status to left lower extremity which impairs their ability to perform daily activities like ambulating in the home. A walker will not resolve issue with performing activities of daily living. A wheelchair will allow patient to safely perform daily activities. Patient can safely propel the wheelchair in the home or has a caregiver who can provide assistance. Length of need 6 months .  Accessories: elevating leg rests (ELRs), wheel locks, extensions and anti-tippers.  Manya Silvas, RN MSN CCM Transitions of Care [phone]  Jaymon Dudek [place] DO [facility]

## 2018-10-28 NOTE — TOC Transition Note (Addendum)
Transition of Care [facility]) - CM/SW Discharge Note   Patient Details  Name: Britanny Marksberry MRN: [id number] Date of Birth: [birth year]  [facility] CM/SW Contact:  Bartholomew Crews, RN Phone Number: [phone] 10/28/2018, 12:17 PM   Clinical Narrative:    Patient ready to transition home today. CSW verified with [facility] that they can begin care this week. Spoke with Thedore Mins at [facility] - referral placed for wheelchair - will be delivered to the room prior to discharge. No other transition of care needs identified at this time.   Update: [employer] attempted to deliver wheelchair. Patient advised that she did not need d/t already having one at home.    Final next level of care: [place] Barriers to Discharge: No Barriers Identified   Patient Goals and CMS Choice Patient states their goals for this hospitalization and ongoing recovery are:: wants to go home with home health CMS Medicare.gov Compare Post Acute Care list provided to:: Patient Choice offered to / list presented to : Patient  Discharge Placement                       Discharge Plan and Services                DME Arranged: Lightweight manual wheelchair with seat cushion DME Agency: [employer] Date DME Agency Contacted: 10/28/18 Time DME Agency Contacted: 1217 Representative spoke with at DME Agency: Thedore Mins HH Arranged: RN, OT, PT [facility] Agency: [place] Date [place]: 10/28/18 Time [place]: 1217    Social Determinants of Health ([place]) Interventions     Readmission Risk Interventions No flowsheet data found.

## 2018-10-28 NOTE — Progress Notes (Signed)
Occupational Therapy Treatment Patient Details Name: Melanie Parsons [place] MRN: [id number] DOB: 1956-01-15 Today's Date: 10/28/2018    History of present illness Pt is a 63 y.o. 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 S/p repair 7/9. 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   OT comments  Pt continues to progress toward established OT goals. Pt currently requires modA+2-maxA+2 for transfer to w/c. Pt adamant about returning home. Pt requires mod-maxA for LB ADL. Pt is at increased risk for falls and requires modA+2-maxA+2 for functional mobility, states "my husband is 3x my size and can carry me", educated pt about concern for husbands safety with transfers. Pt continues to adamantly state she will return home. Continue to recommend SNF level therapies, but if pt is to return home she will need 24/7 physical assistance and HHOT. Pt will continue to benefit from skilled OT services to maximize safety and independence with ADL/IADL and functional mobility. Will continue to follow acutely and progress as tolerated.      Follow Up Recommendations  SNF;Home health OT;Supervision/Assistance - 24 hour    Equipment Recommendations  3 in 1 bedside commode    Recommendations for Other Services      Precautions / Restrictions Precautions Precautions: Fall Restrictions Weight Bearing Restrictions: Yes LLE Weight Bearing: Non weight bearing       Mobility Bed Mobility Overal bed mobility: Needs Assistance Bed Mobility: Supine to Sit     Supine to sit: Mod assist Sit to supine: Mod assist;+2 for physical assistance   General bed mobility comments: modA for LE management, HOB all the way up, definite use of bed rail as well  Transfers Overall transfer level: Needs assistance Equipment used: Rolling walker (2  wheeled) Transfers: Sit to/from [employer]Stand;Stand Pivot Transfers Sit to Stand: Max assist Stand pivot transfers: Max assist;+2 physical assistance       General transfer comment: pt very dictative on technique, maxAx2 to power up with bed elevated, pt with limited use of R UE, pt unable to maintain L LE NWB on the way to the w/c but was able to on the transfer back to bed. attempted to educate on lateral scoot transfers however pt unwilling to learn. Pt educated on trying to minimize risk of injury to both her and her spouse. pt states "My husband is 3x my size, he won't get hurt" pt with noted decresaed insight to deficits    Balance Overall balance assessment: Needs assistance Sitting-balance support: Feet supported;Single extremity supported Sitting balance-Leahy Scale: Fair     Standing balance support: Bilateral upper extremity supported Standing balance-Leahy Scale: Poor Standing balance comment: dependent on RW                           ADL either performed or assessed with clinical judgement   ADL Overall ADL's : Needs assistance/impaired Eating/Feeding: Set up;Bed level           Lower Body Bathing: Moderate assistance;Sitting/lateral leans   Upper Body Dressing : Modified independent   Lower Body Dressing: Sitting/lateral leans;Maximal assistance   Toilet Transfer: +2 for physical assistance;Stand-pivot;RW;Maximal assistance Toilet Transfer Details (indicate cue type and reason): simulated stand-pivot from EOB to w/c          Functional mobility during ADLs: Moderate assistance;+2 for physical assistance;Maximal assistance  Vision       Perception     Praxis      Cognition Arousal/Alertness: Awake/alert Behavior During Therapy: Anxious Overall Cognitive Status: No family/caregiver present to determine baseline cognitive functioning                                 General Comments: pt appears very dependent on spouse and  self limiting        Exercises     Shoulder Instructions       General Comments incision with dressing    Pertinent Vitals/ Pain       Pain Assessment: 0-10 Pain Score: 10-Worst pain ever Pain Location: L LE with movement Pain Descriptors / Indicators: Aching Pain Intervention(s): Limited activity within patient's tolerance;Monitored during session;Repositioned  Home Living                                          Prior Functioning/Environment              Frequency  Min 2X/week        Progress Toward Goals  OT Goals(current goals can now be found in the care plan section)  Progress towards OT goals: Progressing toward goals  Acute Rehab OT Goals Patient Stated Goal: go home today OT Goal Formulation: With patient Time For Goal Achievement: 11/08/18 Potential to Achieve Goals: Fair ADL Goals Pt Will Perform Grooming: sitting;with modified independence Pt Will Perform Lower Body Dressing: with modified independence;sitting/lateral leans;with adaptive equipment Pt Will Transfer to Toilet: with min assist;ambulating;bedside commode Pt Will Perform Tub/Shower Transfer: Tub transfer;ambulating;3 in 1;with min assist  Plan Discharge plan remains appropriate    Co-evaluation    PT/OT/SLP Co-Evaluation/Treatment: Yes Reason for Co-Treatment: For patient/therapist safety;To address functional/ADL transfers PT goals addressed during session: Mobility/safety with mobility OT goals addressed during session: ADL's and self-care      AM-PAC OT "6 Clicks" Daily Activity     Outcome Measure   Help from another person eating meals?: A Little Help from another person taking care of personal grooming?: A Little Help from another person toileting, which includes using toliet, bedpan, or urinal?: A Lot Help from another person bathing (including washing, rinsing, drying)?: A Little Help from another person to put on and taking off regular upper body  clothing?: A Little Help from another person to put on and taking off regular lower body clothing?: A Lot 6 Click Score: 16    End of Session Equipment Utilized During Treatment: Gait belt;Rolling walker  OT Visit Diagnosis: Unsteadiness on feet (R26.81);Repeated falls (R29.6);Muscle weakness (generalized) (M62.81);Pain Pain - Right/Left: Left Pain - part of body: Leg;Hip   Activity Tolerance Patient limited by pain   Patient Left in bed;with call bell/phone within reach;with bed alarm set   Nurse Communication Mobility status;Weight bearing status        Time: 5809-9833 OT Time Calculation (min): 23 min  Charges: OT General Charges $OT Visit: 1 Visit OT Treatments $Self Care/Home Management : 8-22 mins  Dorinda Hill OTR/L [place] Office: [phone]    Wyn Forster 10/28/2018, 12:24 PM

## 2018-10-28 NOTE — Progress Notes (Signed)
Received call from [facility]. They are only able to see patient for PT and OT beginning this week. Stated that therapy is able to follow up with post surgical assessment and care of surgical incision.   Manya Silvas, RN CCM Transitions of Care [phone]

## 2018-10-30 ENCOUNTER — Encounter (HOSPITAL_COMMUNITY): Payer: Self-pay | Admitting: Student

## 2019-08-08 ENCOUNTER — Other Ambulatory Visit: Payer: Self-pay | Admitting: Orthopaedic Surgery

## 2019-08-08 DIAGNOSIS — M25511 Pain in right shoulder: Secondary | ICD-10-CM

## 2019-08-14 ENCOUNTER — Other Ambulatory Visit: Payer: BC Managed Care – PPO

## 2019-08-14 NOTE — Progress Notes (Signed)
PCP - Cheri Rous Cardiologist -   Chest x-ray -  EKG - 10-22-18 epic Stress Test -  ECHO -  Cardiac Cath -  cmp and hgba1c on chart from 07-31-19  Sleep Study -  CPAP -   Fasting Blood Sugar -  Checks Blood Sugar __0___ times a day  Blood Thinner Instructions: Aspirin Instructions: Last Dose:  Anesthesia review:   Patient denies shortness of breath, fever, cough and chest pain at PAT appointment    none   Patient verbalized understanding of instructions that were given to them at the PAT appointment. Patient was also instructed that they will need to review over the PAT instructions again at home before surgery.

## 2019-08-14 NOTE — Patient Instructions (Addendum)
DUE TO COVID-19 ONLY ONE VISITOR IS ALLOWED TO COME WITH YOU AND STAY IN THE WAITING ROOM ONLY DURING PRE OP AND PROCEDURE DAY OF SURGERY. TWO  VISITOR MAY VISIT WITH YOU AFTER SURGERY IN YOUR PRIVATE ROOM DURING VISITING HOURS ONLY! 10a-8p  YOU NEED TO HAVE A COVID 19 TEST ON__5-8-21_____ @_920  am ______, THIS TEST MUST BE DONE BEFORE SURGERY, COME  [address], [place] , [id number].  ([place]) ONCE YOUR COVID TEST IS COMPLETED, PLEASE BEGIN THE QUARANTINE INSTRUCTIONS AS OUTLINED IN YOUR HANDOUT.                Markesia Crilly  08/14/2019   Your procedure is scheduled on: 08-27-19   Report to [facility] Main  Entrance   Report to admitting at     [facility]  AM     Call this number if you have problems the morning of surgery [phone]    Remember: NO SOLID FOOD AFTER MIDNIGHT THE NIGHT PRIOR TO SURGERY. NOTHING BY MOUTH EXCEPT CLEAR LIQUIDS UNTIL 0530 am . PLEASE FINISH G2  DRINK PER SURGEON ORDER  WHICH NEEDS TO BE COMPLETED AT    0530 am then nothing by mouth.    CLEAR LIQUID DIET   Foods Allowed                                                                                        Foods Excluded  Coffee and tea, regular and decaf  No creamer                           liquids that you cannot  Plain Jell-O any favor except red or purple                                           see through such as: Fruit ices (not with fruit pulp)                                                           milk, soups, orange juice  Iced Popsicles                                                        All solid food Carbonated beverages, regular and diet                                    Cranberry, grape and apple juices Sports drinks like Gatorade Lightly seasoned clear broth or consume(fat free) Sugar, honey syrup  _____________________________________________________________________    Michae Kava  TEETH MORNING OF SURGERY AND RINSE YOUR MOUTH OUT, NO  CHEWING GUM CANDY OR MINTS.     Take these medicines the morning of surgery with A SIP OF WATER: omeprazole and xanax if needed                                You may not have any metal on your body including hair pins and              piercings  Do not wear jewelry, make-up, lotions, powders or perfumes, deodorant             Do not wear nail polish on your fingernails.  Do not shave  48 hours prior to surgery.     Do not bring valuables to the hospital. [facility] IS NOT             RESPONSIBLE   FOR VALUABLES.  Contacts, dentures or bridgework may not be worn into surgery.      Patients discharged the day of surgery will not be allowed to drive home. IF YOU ARE HAVING SURGERY AND GOING HOME THE SAME DAY, YOU MUST HAVE AN ADULT TO DRIVE YOU HOME AND BE WITH YOU FOR 24 HOURS. YOU MAY GO HOME BY TAXI OR UBER OR ORTHERWISE, BUT AN ADULT MUST ACCOMPANY YOU HOME AND STAY WITH YOU FOR 24 HOURS.  Name and phone number of your driver:  Special Instructions: N/A              Please read over the following fact sheets you were given: _____________________________________________________________________ [facility]- Preparing for Total Shoulder Arthroplasty    Before surgery, you can play an important role. Because skin is not sterile, your skin needs to be as free of germs as possible. You can reduce the number of germs on your skin by using the following products. . Benzoyl Peroxide Gel o Reduces the number of germs present on the skin o Applied twice a day to shoulder area starting two days before surgery    ==================================================================  Please follow these instructions carefully:  BENZOYL PEROXIDE 5% GEL  Please do not use if you have an allergy to benzoyl peroxide.   If your skin becomes reddened/irritated stop using the benzoyl peroxide.  Starting two days before surgery, apply as follows: 1. Apply benzoyl peroxide in the morning and at  night. Apply after taking a shower. If you are not taking a shower clean entire shoulder front, back, and side along with the armpit with a clean wet washcloth.  2. Place a quarter-sized dollop on your shoulder and rub in thoroughly, making sure to cover the front, back, and side of your shoulder, along with the armpit.   2 days before ____ AM   ____ PM              1 day before ____ AM   ____ PM                         3. Do this twice a day for two days.  (Last application is the night before surgery, AFTER using the CHG soap as described below).  4. Do NOT apply benzoyl peroxide gel on the day of surgery.             [facility] - Preparing for Surgery Before surgery, you can play an important role.  Because  skin is not sterile, your skin needs to be as free of germs as possible.  You can reduce the number of germs on your skin by washing with CHG (chlorahexidine gluconate) soap before surgery.  CHG is an antiseptic cleaner which kills germs and bonds with the skin to continue killing germs even after washing. Please DO NOT use if you have an allergy to CHG or antibacterial soaps.  If your skin becomes reddened/irritated stop using the CHG and inform your nurse when you arrive at Short Stay. Do not shave (including legs and underarms) for at least 48 hours prior to the first CHG shower.  You may shave your face/neck. Please follow these instructions carefully:  1.  Shower with CHG Soap the night before surgery and the  morning of Surgery.  2.  If you choose to wash your hair, wash your hair first as usual with your  normal  shampoo.  3.  After you shampoo, rinse your hair and body thoroughly to remove the  shampoo.                           4.  Use CHG as you would any other liquid soap.  You can apply chg directly  to the skin and wash                       Gently with a scrungie or clean washcloth.  5.  Apply the CHG Soap to your body ONLY FROM THE NECK DOWN.   Do not use on face/ open                            Wound or open sores. Avoid contact with eyes, ears mouth and genitals (private parts).                       Wash face,  Genitals (private parts) with your normal soap.             6.  Wash thoroughly, paying special attention to the area where your surgery  will be performed.  7.  Thoroughly rinse your body with warm water from the neck down.  8.  DO NOT shower/wash with your normal soap after using and rinsing off  the CHG Soap.                9.  Pat yourself dry with a clean towel.            10.  Wear clean pajamas.            11.  Place clean sheets on your bed the night of your first shower and do not  sleep with pets. Day of Surgery : Do not apply any lotions/deodorants the morning of surgery.  Please wear clean clothes to the hospital/surgery center.  FAILURE TO FOLLOW THESE INSTRUCTIONS MAY RESULT IN THE CANCELLATION OF YOUR SURGERY PATIENT SIGNATURE_________________________________  NURSE SIGNATURE__________________________________  ________________________________________________________________________   Rogelia Mire  An incentive spirometer is a tool that can help keep your lungs clear and active. This tool measures how well you are filling your lungs with each breath. Taking long deep breaths may help reverse or decrease the chance of developing breathing (pulmonary) problems (especially infection) following:  A long period of time when you are unable to move or be active. BEFORE THE PROCEDURE   If the  spirometer includes an indicator to show your best effort, your nurse or respiratory therapist will set it to a desired goal.  If possible, sit up straight or lean slightly forward. Try not to slouch.  Hold the incentive spirometer in an upright position. INSTRUCTIONS FOR USE  1. Sit on the edge of your bed if possible, or sit up as far as you can in bed or on a chair. 2. Hold the incentive spirometer in an upright position. 3. Breathe out  normally. 4. Place the mouthpiece in your mouth and seal your lips tightly around it. 5. Breathe in slowly and as deeply as possible, raising the piston or the ball toward the top of the column. 6. Hold your breath for 3-5 seconds or for as long as possible. Allow the piston or ball to fall to the bottom of the column. 7. Remove the mouthpiece from your mouth and breathe out normally. 8. Rest for a few seconds and repeat Steps 1 through 7 at least 10 times every 1-2 hours when you are awake. Take your time and take a few normal breaths between deep breaths. 9. The spirometer may include an indicator to show your best effort. Use the indicator as a goal to work toward during each repetition. 10. After each set of 10 deep breaths, practice coughing to be sure your lungs are clear. If you have an incision (the cut made at the time of surgery), support your incision when coughing by placing a pillow or rolled up towels firmly against it. Once you are able to get out of bed, walk around indoors and cough well. You may stop using the incentive spirometer when instructed by your caregiver.  RISKS AND COMPLICATIONS  Take your time so you do not get dizzy or light-headed.  If you are in pain, you may need to take or ask for pain medication before doing incentive spirometry. It is harder to take a deep breath if you are having pain. AFTER USE  Rest and breathe slowly and easily.  It can be helpful to keep track of a log of your progress. Your caregiver can provide you with a simple table to help with this. If you are using the spirometer at home, follow these instructions: SEEK MEDICAL CARE IF:   You are having difficultly using the spirometer.  You have trouble using the spirometer as often as instructed.  Your pain medication is not giving enough relief while using the spirometer.  You develop fever of 100.5 F (38.1 C) or higher. SEEK IMMEDIATE MEDICAL CARE IF:   You cough up bloody sputum  that had not been present before.  You develop fever of 102 F (38.9 C) or greater.  You develop worsening pain at or near the incision site. MAKE SURE YOU:   Understand these instructions.  Will watch your condition.  Will get help right away if you are not doing well or get worse. Document Released: 08/14/2006 Document Revised: 06/26/2011 Document Reviewed: 10/15/2006 [facility] Patient Information 2014 [place], [place].   ________________________________________________________________________

## 2019-08-18 ENCOUNTER — Encounter (INDEPENDENT_AMBULATORY_CARE_PROVIDER_SITE_OTHER): Payer: Self-pay

## 2019-08-18 ENCOUNTER — Encounter (HOSPITAL_COMMUNITY)
Admission: RE | Admit: 2019-08-18 | Discharge: 2019-08-18 | Disposition: A | Discharge status: Home / Self Care | Payer: BC Managed Care – PPO | Source: Ambulatory Visit | Attending: Orthopaedic Surgery | Admitting: Orthopaedic Surgery

## 2019-08-18 ENCOUNTER — Other Ambulatory Visit: Payer: Self-pay

## 2019-08-18 ENCOUNTER — Encounter (HOSPITAL_COMMUNITY): Payer: Self-pay

## 2019-08-18 DIAGNOSIS — Z01812 Encounter for preprocedural laboratory examination: Secondary | ICD-10-CM | POA: Insufficient documentation

## 2019-08-18 HISTORY — DX: Type 2 diabetes mellitus without complications: E11.9

## 2019-08-18 LAB — BASIC METABOLIC PANEL
Anion gap: 10 (ref 5–15)
BUN: 14 mg/dL (ref 8–23)
CO2: 22 mmol/L (ref 22–32)
Calcium: 9.3 mg/dL (ref 8.9–10.3)
Chloride: 99 mmol/L (ref 98–111)
Creatinine, Ser: 0.64 mg/dL (ref 0.44–1.00)
GFR calc Af Amer: >60 mL/min (ref >60)
GFR calc non Af Amer: >60 mL/min (ref >60)
Glucose, Bld: 117 mg/dL — ABNORMAL HIGH (ref 70–99)
Potassium: 4.6 mmol/L (ref 3.5–5.1)
Sodium: 131 mmol/L — ABNORMAL LOW (ref 135–145)

## 2019-08-18 LAB — CBC
HCT: 39.8 % (ref 36.0–46.0)
Hemoglobin: 12.5 g/dL (ref 12.0–15.0)
MCH: 25.6 pg — ABNORMAL LOW (ref 26.0–34.0)
MCHC: 31.4 g/dL (ref 30.0–36.0)
MCV: 81.4 fL (ref 80.0–100.0)
Platelets: 586 10*3/uL — ABNORMAL HIGH (ref 150–400)
RBC: 4.89 MIL/uL (ref 3.87–5.11)
RDW: 18.3 % — ABNORMAL HIGH (ref 11.5–15.5)
WBC: 12 10*3/uL — ABNORMAL HIGH (ref 4.0–10.5)
nRBC: 0 % (ref 0.0–0.2)

## 2019-08-18 LAB — SURGICAL PCR SCREEN
MRSA, PCR: NEGATIVE
Staphylococcus aureus: NEGATIVE

## 2019-08-18 LAB — GLUCOSE, CAPILLARY: Glucose-Capillary: 130 mg/dL — ABNORMAL HIGH (ref 70–99)

## 2019-08-22 ENCOUNTER — Other Ambulatory Visit: Payer: Self-pay

## 2019-08-22 ENCOUNTER — Ambulatory Visit
Admission: RE | Admit: 2019-08-22 | Discharge: 2019-08-22 | Disposition: A | Discharge status: Home / Self Care | Payer: BC Managed Care – PPO | Source: Ambulatory Visit | Attending: Orthopaedic Surgery | Admitting: Orthopaedic Surgery

## 2019-08-22 DIAGNOSIS — M25511 Pain in right shoulder: Secondary | ICD-10-CM

## 2019-08-23 ENCOUNTER — Other Ambulatory Visit (HOSPITAL_COMMUNITY)
Admission: RE | Admit: 2019-08-23 | Discharge: 2019-08-23 | Disposition: A | Discharge status: Home / Self Care | Payer: BC Managed Care – PPO | Source: Ambulatory Visit | Attending: Orthopaedic Surgery | Admitting: Orthopaedic Surgery

## 2019-08-23 DIAGNOSIS — Z01812 Encounter for preprocedural laboratory examination: Secondary | ICD-10-CM | POA: Diagnosis not present

## 2019-08-23 DIAGNOSIS — Z20822 Contact with and (suspected) exposure to covid-19: Secondary | ICD-10-CM | POA: Diagnosis not present

## 2019-08-23 LAB — SARS CORONAVIRUS 2 (TAT 6-24 HRS): SARS Coronavirus 2: NEGATIVE

## 2019-08-26 NOTE — Anesthesia Preprocedure Evaluation (Addendum)
Anesthesia Evaluation  Patient identified by MRN, date of birth, ID band Patient awake    Reviewed: Allergy & Precautions, NPO status , Patient's Chart, lab work & pertinent test results  Airway Mallampati: II  TM Distance: >3 FB Neck ROM: Full    Dental  (+) Dental Advisory Given   Pulmonary COPD, Current Smoker,    breath sounds clear to auscultation       Cardiovascular negative cardio ROS   Rhythm:Regular Rate:Normal     Neuro/Psych  Headaches,    GI/Hepatic Neg liver ROS, GERD  Medicated,  Endo/Other  diabetes, Type 2  Renal/GU negative Renal ROS     Musculoskeletal  (+) Arthritis ,   Abdominal   Peds  Hematology negative hematology ROS (+)   Anesthesia Other Findings   Reproductive/Obstetrics                            Lab Results  Component Value Date   WBC 12.0 (H) 08/18/2019   HGB 12.5 08/18/2019   HCT 39.8 08/18/2019   MCV 81.4 08/18/2019   PLT 586 (H) 08/18/2019   Lab Results  Component Value Date   CREATININE 0.64 08/18/2019   BUN 14 08/18/2019   NA 131 (L) 08/18/2019   K 4.6 08/18/2019   CL 99 08/18/2019   CO2 22 08/18/2019    Anesthesia Physical Anesthesia Plan  ASA: III  Anesthesia Plan: General   Post-op Pain Management:  [facility] for Post-op pain   Induction: Intravenous  PONV Risk Score and Plan: 2 and Dexamethasone, Ondansetron and Treatment may vary due to age or medical condition  Airway Management Planned: Oral ETT  Additional Equipment: None  Intra-op Plan:   Post-operative Plan: Extubation in OR  Informed Consent: I have reviewed the patients History and Physical, chart, labs and discussed the procedure including the risks, benefits and alternatives for the proposed anesthesia with the patient or authorized representative who has indicated his/her understanding and acceptance.     Dental advisory given  Plan Discussed with:  CRNA  Anesthesia Plan Comments:        Anesthesia Quick Evaluation

## 2019-08-26 NOTE — H&P (Signed)
PREOPERATIVE H&P  Chief Complaint: right shoulder djd  HPI: Melanie Parsons is a 64 y.o. female who is scheduled for REVERSE SHOULDER ARTHROPLASTY.   Patient has a past medical history significant for GERD, DM type 2, and migraines.   The patient is a 64 year old who has a history of multiple non-unions and broken bones. She had a nonoperatively treated right proximal humerus fracture in 2018. She was unable to lift her arm afterwards. She had multiple surgeries on her lower extremities including a total hip on the right and multiple non-union surgeries on the left.  She is just now able to put weight on her left leg. 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 Jena Gauss did not feel it was amenable to repair. She had some issues healing fractures in the past.   Her symptoms are rated as moderate to severe, and have been worsening.  This is significantly impairing activities of daily living.    Please see clinic note for further details on this patient's care.    She has elected for surgical management.   Past Medical History:  Diagnosis Date  . Anxiety   . Arthritis    "joints" (12/25/2017)  . Diabetes mellitus without complication (HCC)    type 2  no meds  . GERD (gastroesophageal reflux disease)   . History of blood transfusion    "related RX I took; made my have my period q 2 wks"  . Migraine    "had one 3 days last week" (12/25/2017)  . Pneumonia    "walking pneumonia one times" (12/25/2017)  . Urinary tract infection 12/27/2017  . Wears glasses   . Wears partial dentures    Past Surgical History:  Procedure Laterality Date  . APPENDECTOMY  1990  . FEMUR FRACTURE SURGERY Left   . FRACTURE SURGERY    . HARDWARE REMOVAL Left 12/26/2017   Procedure: HARDWARE REMOVAL;  Surgeon: Roby Lofts, MD;  Location: [facility] OR;  Service: Orthopedics;  Laterality: Left;  removal of plate, screws and cables.   . JOINT REPLACEMENT    . MULTIPLE TOOTH EXTRACTIONS      . ORIF FEMUR FRACTURE Left 10/24/2018   Procedure: OPEN REDUCTION INTERNAL FIXATION FEMORAL NONUNION;  Surgeon: Roby Lofts, MD;  Location: [facility] OR;  Service: Orthopedics;  Laterality: Left;  . ORIF PERIPROSTHETIC FRACTURE Left 12/26/2017   Procedure: OPEN REDUCTION INTERNAL FIXATION (ORIF) PERIPROSTHETIC FRACTURE;  Surgeon: Roby Lofts, MD;  Location: [facility] OR;  Service: Orthopedics;  Laterality: Left;  . SPLENECTOMY, TOTAL  1990  . TOTAL HIP ARTHROPLASTY Left 2016  . WRIST FRACTURE SURGERY Right    "put in titanium rod"   Social History   Socioeconomic History  . Marital status: Married    Spouse name: Not on file  . Number of children: Not on file  . Years of education: Not on file  . Highest education level: Not on file  Occupational History  . Not on file  Tobacco Use  . Smoking status: Current Every Day Smoker    Packs/day: 0.50    Years: 30.00    Pack years: 15.00    Types: Cigarettes  . Smokeless tobacco: Never Used  Substance and Sexual Activity  . Alcohol use: Not Currently    Comment: 12/25/2017 "nothing in 2019"  . Drug use: Never  . Sexual activity: Not Currently  Other Topics Concern  . Not on file  Social History Narrative  .  Not on file   Social Determinants of Health   Financial Resource Strain:   . Difficulty of Paying Living Expenses:   Food Insecurity:   . Worried About Charity fundraiser in the Last Year:   . Arboriculturist in the Last Year:   Transportation Needs:   . Film/video editor (Medical):   Marland Kitchen Lack of Transportation (Non-Medical):   Physical Activity:   . Days of Exercise per Week:   . Minutes of Exercise per Session:   Stress:   . Feeling of Stress :   Social Connections:   . Frequency of Communication with Friends and Family:   . Frequency of Social Gatherings with Friends and Family:   . Attends Religious Services:   . Active Member of Clubs or Organizations:   . Attends Archivist Meetings:   Marland Kitchen Marital Status:     No family history on file. Allergies  Allergen Reactions  . Penicillins Itching and Rash   Prior to Admission medications   Medication Sig Start Date End Date Taking? Authorizing Provider  acetaminophen (TYLENOL) 500 MG tablet Take 1,000 mg by mouth every 6 (six) hours as needed for mild pain.    Yes [provider]  ALPRAZolam Duanne Moron) 1 MG tablet Take 1 mg by mouth 3 (three) times daily.   Yes [provider]  aspirin 325 MG tablet Take 325 mg by mouth daily.   Yes [provider]  calcium-vitamin D (OSCAL WITH D) 500-200 MG-UNIT tablet Take 1 tablet by mouth daily.   Yes [provider]  mirtazapine (REMERON) 30 MG tablet Take 30 mg by mouth at bedtime.   Yes [provider]  omeprazole (PRILOSEC) 20 MG capsule Take 20 mg by mouth daily.   Yes [provider]  oxymetazoline (AFRIN NODRIP SEVERE CONGEST) 0.05 % nasal spray Place 1 spray into both nostrils as needed for congestion.   Yes [provider]  vitamin E 180 MG (400 UNITS) capsule Take 400 Units by mouth daily.   Yes [provider]  calcium citrate (CALCITRATE - DOSED IN MG ELEMENTAL CALCIUM) 950 MG tablet Take 1 tablet (200 mg of elemental calcium total) by mouth 2 (two) times daily. Patient not taking: Reported on 10/22/2018 12/30/17   Dhungel, Flonnie Overman, MD  cholecalciferol 2000 units TABS Take 1 tablet (2,000 Units total) by mouth 2 (two) times daily. Patient not taking: Reported on 10/22/2018 12/30/17   Dhungel, Flonnie Overman, MD  enoxaparin (LOVENOX) 40 MG/0.4ML injection Inject 0.4 mLs (40 mg total) into the skin daily for 12 days. Patient not taking: Reported on 08/11/2019 10/28/18 11/09/18  [place] Eric J, DO  ferrous sulfate 325 (65 FE) MG tablet Take 1 tablet (325 mg total) by mouth 2 (two) times daily with a meal. Patient not taking: Reported on 10/22/2018 12/30/17   Dhungel, Flonnie Overman, MD  lactose free nutrition (BOOST PLUS) LIQD Take 237 mLs by mouth 3 (three)  times daily with meals. Patient not taking: Reported on 10/22/2018 12/30/17   Dhungel, Flonnie Overman, MD  methocarbamol (ROBAXIN) 500 MG tablet Take 1 tablet (500 mg total) by mouth every 6 (six) hours as needed for muscle spasms. Patient not taking: Reported on 08/11/2019 10/28/18   Delray Alt, PA-C  Multiple Vitamin (MULTIVITAMIN WITH MINERALS) TABS tablet Take 1 tablet by mouth daily. Patient not taking: Reported on 10/22/2018 12/31/17   Dhungel, Flonnie Overman, MD  oxyCODONE (OXY IR/ROXICODONE) 5 MG immediate release tablet Take 1 tablet (5 mg total)  by mouth every 4 (four) hours as needed for moderate pain. Patient not taking: Reported on 08/11/2019 10/28/18   Despina Hidden, PA-C  polyethylene glycol [facility]) packet Take 17 g by mouth daily as needed for mild constipation. Patient not taking: Reported on 10/22/2018 12/30/17   Dhungel, Theda Belfast, MD  vitamin C (VITAMIN C) 500 MG tablet Take 1 tablet (500 mg total) by mouth daily. Patient not taking: Reported on 10/22/2018 12/31/17   Dhungel, Theda Belfast, MD    ROS: All other systems have been reviewed and were otherwise negative with the exception of those mentioned in the HPI and as above.  Physical Exam: General: Alert, no acute distress Cardiovascular: No pedal edema Respiratory: No cyanosis, no use of accessory musculature GI: No organomegaly, abdomen is soft and non-tender Skin: No lesions in the area of chief complaint Neurologic: Sensation intact distally Psychiatric: Patient is competent for consent with normal mood and affect Lymphatic: No axillary or cervical lymphadenopathy  MUSCULOSKELETAL:  Right shoulder: she has an active forward elevation to about 20 degrees. Axillary nerve appears to be firing. Cuff strength is unable to be tested in the setting of pain. There is obvious palpable motion to the proximal humerus.  Imaging: X-rays are reviewed demonstrating an atrophic nonunion of the proximal humerus with clear  insability and erosion of the  distal fragment itself.  The humeral head appears to be normal.  Assessment: Right proximal humerus malunion.   Plan: Plan for Procedure(s): REVERSE SHOULDER ARTHROPLASTY  Dr. Everardo Pacific and the patient talked to the patient about the risks and benefits of a reverse total shoulder arthroplasty and non-union take down. The patient understands the plan of care. We may obtain cultures at the time of surgery and put her on antibiotics.  The risks, benefits and alternatives were all discussed including a high risk for infection, fracture, loss of stability, and possibility of a proximal humeral resection if she is not able to be reconstructed.   The risks benefits and alternatives were discussed with the patient including but not limited to the risks of nonoperative treatment, versus surgical intervention including infection, bleeding, nerve injury,  blood clots, cardiopulmonary complications, morbidity, mortality, among others, and they were willing to proceed.   We additionally specifically discussed risks of axillary nerve injury, infection, periprosthetic fracture, continued pain and longevity of implants prior to beginning procedure.    Patient will be monitored closely in PACU. If she is found stable in PACU, patient may be discharged home with outpatient follow-up. If any concern about medical stability, patient will be admitted for observation for medical monitoring post-op and for pain control. The patient is planning to be discharged home with outpatient PT.   The patient acknowledged the explanation, agreed to proceed with the plan and consent was signed.   Patient has received operative clearance from her PCP, Dr. Egbert Garibaldi.   Operative Plan: Right reverse total shoulder arthroplasty with non-union take down Discharge Medications: Tylenol, Celebrex, Oxycodone, Zofran DVT Prophylaxis: None Physical Therapy: Outpatient PT Special Discharge needs: Sling   Vernetta Honey,  PA-C  08/26/2019 4:09 PM

## 2019-08-27 ENCOUNTER — Ambulatory Visit (HOSPITAL_COMMUNITY): Payer: BC Managed Care – PPO

## 2019-08-27 ENCOUNTER — Ambulatory Visit (HOSPITAL_COMMUNITY)
Admission: RE | Admit: 2019-08-27 | Discharge: 2019-08-29 | Disposition: A | Discharge status: Home / Self Care | Payer: BC Managed Care – PPO | Attending: Orthopaedic Surgery | Admitting: Orthopaedic Surgery

## 2019-08-27 ENCOUNTER — Ambulatory Visit (HOSPITAL_COMMUNITY): Payer: BC Managed Care – PPO | Admitting: Certified Registered Nurse Anesthetist

## 2019-08-27 ENCOUNTER — Encounter (HOSPITAL_COMMUNITY)
Admission: RE | Disposition: A | Discharge status: Home / Self Care | Payer: Self-pay | Source: Home / Self Care | Attending: Orthopaedic Surgery

## 2019-08-27 ENCOUNTER — Encounter (HOSPITAL_COMMUNITY): Payer: Self-pay | Admitting: Orthopaedic Surgery

## 2019-08-27 ENCOUNTER — Other Ambulatory Visit: Payer: Self-pay

## 2019-08-27 DIAGNOSIS — F17210 Nicotine dependence, cigarettes, uncomplicated: Secondary | ICD-10-CM | POA: Insufficient documentation

## 2019-08-27 DIAGNOSIS — F419 Anxiety disorder, unspecified: Secondary | ICD-10-CM | POA: Diagnosis not present

## 2019-08-27 DIAGNOSIS — E119 Type 2 diabetes mellitus without complications: Secondary | ICD-10-CM | POA: Diagnosis not present

## 2019-08-27 DIAGNOSIS — M84421A Pathological fracture, right humerus, initial encounter for fracture: Secondary | ICD-10-CM | POA: Diagnosis not present

## 2019-08-27 DIAGNOSIS — M19011 Primary osteoarthritis, right shoulder: Secondary | ICD-10-CM | POA: Diagnosis not present

## 2019-08-27 DIAGNOSIS — S42209K Unspecified fracture of upper end of unspecified humerus, subsequent encounter for fracture with nonunion: Secondary | ICD-10-CM | POA: Diagnosis present

## 2019-08-27 DIAGNOSIS — I9589 Other hypotension: Secondary | ICD-10-CM | POA: Insufficient documentation

## 2019-08-27 DIAGNOSIS — Z96642 Presence of left artificial hip joint: Secondary | ICD-10-CM | POA: Diagnosis not present

## 2019-08-27 DIAGNOSIS — Z419 Encounter for procedure for purposes other than remedying health state, unspecified: Secondary | ICD-10-CM

## 2019-08-27 DIAGNOSIS — Z7982 Long term (current) use of aspirin: Secondary | ICD-10-CM | POA: Diagnosis not present

## 2019-08-27 DIAGNOSIS — K219 Gastro-esophageal reflux disease without esophagitis: Secondary | ICD-10-CM | POA: Insufficient documentation

## 2019-08-27 DIAGNOSIS — Z79899 Other long term (current) drug therapy: Secondary | ICD-10-CM | POA: Diagnosis not present

## 2019-08-27 DIAGNOSIS — Z09 Encounter for follow-up examination after completed treatment for conditions other than malignant neoplasm: Secondary | ICD-10-CM

## 2019-08-27 HISTORY — PX: REVERSE SHOULDER ARTHROPLASTY: SHX5054

## 2019-08-27 LAB — CBC
HCT: 34.7 % — ABNORMAL LOW (ref 36.0–46.0)
Hemoglobin: 10.9 g/dL — ABNORMAL LOW (ref 12.0–15.0)
MCH: 25.3 pg — ABNORMAL LOW (ref 26.0–34.0)
MCHC: 31.4 g/dL (ref 30.0–36.0)
MCV: 80.5 fL (ref 80.0–100.0)
Platelets: 521 10*3/uL — ABNORMAL HIGH (ref 150–400)
RBC: 4.31 MIL/uL (ref 3.87–5.11)
RDW: 18.2 % — ABNORMAL HIGH (ref 11.5–15.5)
WBC: 12.1 10*3/uL — ABNORMAL HIGH (ref 4.0–10.5)
nRBC: 0 % (ref 0.0–0.2)

## 2019-08-27 LAB — CREATININE, SERUM
Creatinine, Ser: 0.91 mg/dL (ref 0.44–1.00)
GFR calc Af Amer: >60 mL/min (ref >60)
GFR calc non Af Amer: >60 mL/min (ref >60)

## 2019-08-27 LAB — GLUCOSE, CAPILLARY: Glucose-Capillary: 113 mg/dL — ABNORMAL HIGH (ref 70–99)

## 2019-08-27 SURGERY — ARTHROPLASTY, SHOULDER, TOTAL, REVERSE
Anesthesia: General | Site: Shoulder | Laterality: Right

## 2019-08-27 MED ORDER — EPHEDRINE 5 MG/ML INJ
INTRAVENOUS | Status: AC
  Filled 2019-08-27: qty 10

## 2019-08-27 MED ORDER — BISACODYL 10 MG RE SUPP: 10.0000 mg | Freq: Every day | RECTAL | Status: DC | PRN

## 2019-08-27 MED ORDER — MIRTAZAPINE 15 MG PO TABS
30.0000 mg | ORAL_TABLET | Freq: Every day | ORAL | Status: DC
  Administered 2019-08-27 – 2019-08-28 (×2): 30 mg via ORAL
  Filled 2019-08-27 (×2): qty 2

## 2019-08-27 MED ORDER — DEXAMETHASONE SODIUM PHOSPHATE 10 MG/ML IJ SOLN
INTRAMUSCULAR | Status: DC | PRN
  Administered 2019-08-27: 5 mg via INTRAVENOUS

## 2019-08-27 MED ORDER — FENTANYL CITRATE (PF) 100 MCG/2ML IJ SOLN: 25.0000 ug | INTRAMUSCULAR | Status: DC | PRN

## 2019-08-27 MED ORDER — TOBRAMYCIN SULFATE 1.2 G IJ SOLR
INTRAMUSCULAR | Status: DC | PRN
  Administered 2019-08-27: 1.2 g via TOPICAL

## 2019-08-27 MED ORDER — FENTANYL CITRATE (PF) 100 MCG/2ML IJ SOLN
INTRAMUSCULAR | Status: AC
  Filled 2019-08-27: qty 2

## 2019-08-27 MED ORDER — POLYETHYLENE GLYCOL 3350 17 G PO PACK: 17.0000 g | PACK | Freq: Every day | ORAL | Status: DC | PRN

## 2019-08-27 MED ORDER — PROPOFOL 10 MG/ML IV BOLUS
INTRAVENOUS | Status: AC
  Filled 2019-08-27: qty 20

## 2019-08-27 MED ORDER — STERILE WATER FOR IRRIGATION IR SOLN
Status: DC | PRN
  Administered 2019-08-27: 2000 mL

## 2019-08-27 MED ORDER — CELECOXIB 200 MG PO CAPS
200.0000 mg | ORAL_CAPSULE | Freq: Two times a day (BID) | ORAL | Status: DC
  Administered 2019-08-27 – 2019-08-29 (×4): 200 mg via ORAL
  Filled 2019-08-27 (×4): qty 1

## 2019-08-27 MED ORDER — SUGAMMADEX SODIUM 200 MG/2ML IV SOLN
INTRAVENOUS | Status: DC | PRN
  Administered 2019-08-27: 150 mg via INTRAVENOUS

## 2019-08-27 MED ORDER — 0.9 % SODIUM CHLORIDE (POUR BTL) OPTIME
TOPICAL | Status: DC | PRN
  Administered 2019-08-27: 09:00:00 1000 mL

## 2019-08-27 MED ORDER — BUPIVACAINE-EPINEPHRINE (PF) 0.5% -1:200000 IJ SOLN
INTRAMUSCULAR | Status: DC | PRN
  Administered 2019-08-27: 15 mL via PERINEURAL

## 2019-08-27 MED ORDER — FENTANYL CITRATE (PF) 100 MCG/2ML IJ SOLN
INTRAMUSCULAR | Status: DC | PRN
  Administered 2019-08-27 (×2): 50 ug via INTRAVENOUS
  Administered 2019-08-27 (×2): 25 ug via INTRAVENOUS
  Administered 2019-08-27: 50 ug via INTRAVENOUS

## 2019-08-27 MED ORDER — PHENYLEPHRINE 40 MCG/ML (10ML) SYRINGE FOR IV PUSH (FOR BLOOD PRESSURE SUPPORT)
PREFILLED_SYRINGE | INTRAVENOUS | Status: AC
  Filled 2019-08-27: qty 20

## 2019-08-27 MED ORDER — HYDROMORPHONE HCL 1 MG/ML IJ SOLN: 0.2500 mg | INTRAMUSCULAR | Status: DC | PRN

## 2019-08-27 MED ORDER — ASPIRIN 325 MG PO TABS
325.0000 mg | ORAL_TABLET | Freq: Every day | ORAL | Status: DC
  Administered 2019-08-28: 325 mg via ORAL
  Filled 2019-08-27: qty 1

## 2019-08-27 MED ORDER — ONDANSETRON HCL 4 MG/2ML IJ SOLN
INTRAMUSCULAR | Status: DC | PRN
  Administered 2019-08-27: 4 mg via INTRAVENOUS

## 2019-08-27 MED ORDER — DOCUSATE SODIUM 100 MG PO CAPS
100.0000 mg | ORAL_CAPSULE | Freq: Two times a day (BID) | ORAL | Status: DC
  Administered 2019-08-27 – 2019-08-29 (×3): 100 mg via ORAL
  Filled 2019-08-27 (×4): qty 1

## 2019-08-27 MED ORDER — PROPOFOL 10 MG/ML IV BOLUS
INTRAVENOUS | Status: DC | PRN
  Administered 2019-08-27: 100 mg via INTRAVENOUS

## 2019-08-27 MED ORDER — ENOXAPARIN SODIUM 40 MG/0.4ML ~~LOC~~ SOLN
40.0000 mg | SUBCUTANEOUS | Status: DC
  Administered 2019-08-28 – 2019-08-29 (×2): 40 mg via SUBCUTANEOUS
  Filled 2019-08-27 (×2): qty 0.4

## 2019-08-27 MED ORDER — ACETAMINOPHEN 500 MG PO TABS
1000.0000 mg | ORAL_TABLET | Freq: Three times a day (TID) | ORAL | Status: AC
  Administered 2019-08-27 – 2019-08-28 (×4): 1000 mg via ORAL
  Filled 2019-08-27 (×4): qty 2

## 2019-08-27 MED ORDER — LACTATED RINGERS IV BOLUS
1000.0000 mL | Freq: Once | INTRAVENOUS | Status: AC
  Administered 2019-08-27: 1000 mL via INTRAVENOUS

## 2019-08-27 MED ORDER — ONDANSETRON HCL 4 MG/2ML IJ SOLN
INTRAMUSCULAR | Status: AC
  Filled 2019-08-27: qty 2

## 2019-08-27 MED ORDER — ONDANSETRON HCL 4 MG/2ML IJ SOLN: 4.0000 mg | Freq: Four times a day (QID) | INTRAMUSCULAR | Status: DC | PRN

## 2019-08-27 MED ORDER — PROMETHAZINE HCL 25 MG/ML IJ SOLN: 6.2500 mg | INTRAMUSCULAR | Status: DC | PRN

## 2019-08-27 MED ORDER — METOCLOPRAMIDE HCL 5 MG PO TABS: 5.0000 mg | ORAL_TABLET | Freq: Three times a day (TID) | ORAL | Status: DC | PRN

## 2019-08-27 MED ORDER — DIPHENHYDRAMINE HCL 12.5 MG/5ML PO ELIX: 12.5000 mg | ORAL_SOLUTION | ORAL | Status: DC | PRN

## 2019-08-27 MED ORDER — ACETAMINOPHEN 500 MG PO TABS
1000.0000 mg | ORAL_TABLET | Freq: Once | ORAL | Status: AC
  Administered 2019-08-27: 1000 mg via ORAL
  Filled 2019-08-27: qty 2

## 2019-08-27 MED ORDER — METHOCARBAMOL 500 MG PO TABS
500.0000 mg | ORAL_TABLET | Freq: Four times a day (QID) | ORAL | Status: DC | PRN
  Filled 2019-08-27: qty 1

## 2019-08-27 MED ORDER — ROCURONIUM BROMIDE 10 MG/ML (PF) SYRINGE
PREFILLED_SYRINGE | INTRAVENOUS | Status: AC
  Filled 2019-08-27: qty 10

## 2019-08-27 MED ORDER — VANCOMYCIN HCL 1 G IV SOLR
INTRAVENOUS | Status: DC | PRN
  Administered 2019-08-27: 1000 mg via TOPICAL

## 2019-08-27 MED ORDER — DEXAMETHASONE SODIUM PHOSPHATE 10 MG/ML IJ SOLN
INTRAMUSCULAR | Status: AC
  Filled 2019-08-27: qty 1

## 2019-08-27 MED ORDER — CEFAZOLIN SODIUM-DEXTROSE 1-4 GM/50ML-% IV SOLN
1.0000 g | Freq: Four times a day (QID) | INTRAVENOUS | Status: AC
  Administered 2019-08-27 – 2019-08-28 (×3): 1 g via INTRAVENOUS
  Filled 2019-08-27 (×4): qty 50

## 2019-08-27 MED ORDER — BUPIVACAINE LIPOSOME 1.3 % IJ SUSP
INTRAMUSCULAR | Status: DC | PRN
  Administered 2019-08-27: 10 mL via PERINEURAL

## 2019-08-27 MED ORDER — TOBRAMYCIN SULFATE 1.2 G IJ SOLR
INTRAMUSCULAR | Status: AC
  Filled 2019-08-27: qty 1.2

## 2019-08-27 MED ORDER — ROCURONIUM BROMIDE 10 MG/ML (PF) SYRINGE
PREFILLED_SYRINGE | INTRAVENOUS | Status: DC | PRN
  Administered 2019-08-27: 20 mg via INTRAVENOUS
  Administered 2019-08-27: 30 mg via INTRAVENOUS
  Administered 2019-08-27 (×2): 20 mg via INTRAVENOUS

## 2019-08-27 MED ORDER — METOCLOPRAMIDE HCL 5 MG/ML IJ SOLN: 5.0000 mg | Freq: Three times a day (TID) | INTRAMUSCULAR | Status: DC | PRN

## 2019-08-27 MED ORDER — SODIUM CHLORIDE 0.9 % IR SOLN
Status: DC | PRN
  Administered 2019-08-27: 1000 mL

## 2019-08-27 MED ORDER — PANTOPRAZOLE SODIUM 40 MG PO TBEC
40.0000 mg | DELAYED_RELEASE_TABLET | Freq: Every day | ORAL | Status: DC
  Administered 2019-08-28 – 2019-08-29 (×2): 40 mg via ORAL
  Filled 2019-08-27 (×2): qty 1

## 2019-08-27 MED ORDER — PHENYLEPHRINE 40 MCG/ML (10ML) SYRINGE FOR IV PUSH (FOR BLOOD PRESSURE SUPPORT)
PREFILLED_SYRINGE | INTRAVENOUS | Status: DC | PRN
  Administered 2019-08-27: 280 ug via INTRAVENOUS
  Administered 2019-08-27: 120 ug via INTRAVENOUS

## 2019-08-27 MED ORDER — MAGNESIUM CITRATE PO SOLN: 1.0000 | Freq: Once | ORAL | Status: DC | PRN

## 2019-08-27 MED ORDER — MIDAZOLAM HCL 2 MG/2ML IJ SOLN
INTRAMUSCULAR | Status: AC
  Filled 2019-08-27: qty 2

## 2019-08-27 MED ORDER — OXYCODONE HCL 5 MG PO TABS
5.0000 mg | ORAL_TABLET | ORAL | Status: DC | PRN
  Administered 2019-08-28: 5 mg via ORAL
  Administered 2019-08-28: 10 mg via ORAL
  Administered 2019-08-29: 5 mg via ORAL
  Filled 2019-08-27 (×2): qty 1

## 2019-08-27 MED ORDER — LIDOCAINE 2% (20 MG/ML) 5 ML SYRINGE
INTRAMUSCULAR | Status: DC | PRN
  Administered 2019-08-27: 20 mg via INTRAVENOUS

## 2019-08-27 MED ORDER — TRANEXAMIC ACID-NACL 1000-0.7 MG/100ML-% IV SOLN
1000.0000 mg | INTRAVENOUS | Status: AC
  Administered 2019-08-27: 1000 mg via INTRAVENOUS
  Filled 2019-08-27: qty 100

## 2019-08-27 MED ORDER — ONDANSETRON HCL 4 MG PO TABS: 4.0000 mg | ORAL_TABLET | Freq: Four times a day (QID) | ORAL | Status: DC | PRN

## 2019-08-27 MED ORDER — MENTHOL 3 MG MT LOZG: 1.0000 | LOZENGE | OROMUCOSAL | Status: DC | PRN

## 2019-08-27 MED ORDER — METHOCARBAMOL 500 MG IVPB - SIMPLE MED
500.0000 mg | Freq: Four times a day (QID) | INTRAVENOUS | Status: DC | PRN
  Filled 2019-08-27: qty 50

## 2019-08-27 MED ORDER — LIDOCAINE 2% (20 MG/ML) 5 ML SYRINGE
INTRAMUSCULAR | Status: AC
  Filled 2019-08-27: qty 5

## 2019-08-27 MED ORDER — FENTANYL CITRATE (PF) 250 MCG/5ML IJ SOLN
INTRAMUSCULAR | Status: AC
  Filled 2019-08-27: qty 5

## 2019-08-27 MED ORDER — LACTATED RINGERS IV SOLN
INTRAVENOUS | Status: DC | PRN
Start: 2019-08-27 — End: 2019-08-27

## 2019-08-27 MED ORDER — PHENOL 1.4 % MT LIQD: 1.0000 | OROMUCOSAL | Status: DC | PRN

## 2019-08-27 MED ORDER — ALPRAZOLAM 1 MG PO TABS
1.0000 mg | ORAL_TABLET | Freq: Three times a day (TID) | ORAL | Status: DC
  Administered 2019-08-27 – 2019-08-29 (×6): 1 mg via ORAL
  Filled 2019-08-27 (×6): qty 1

## 2019-08-27 MED ORDER — VANCOMYCIN HCL 1000 MG IV SOLR
INTRAVENOUS | Status: AC
  Filled 2019-08-27: qty 1000

## 2019-08-27 MED ORDER — PHENYLEPHRINE HCL-NACL 10-0.9 MG/250ML-% IV SOLN
INTRAVENOUS | Status: DC | PRN
  Administered 2019-08-27 (×2): 50 ug/min via INTRAVENOUS

## 2019-08-27 MED ORDER — MIDAZOLAM HCL 5 MG/5ML IJ SOLN
INTRAMUSCULAR | Status: DC | PRN
  Administered 2019-08-27 (×2): 1 mg via INTRAVENOUS

## 2019-08-27 MED ORDER — CEFAZOLIN SODIUM-DEXTROSE 2-4 GM/100ML-% IV SOLN
2.0000 g | INTRAVENOUS | Status: AC
  Administered 2019-08-27: 2 g via INTRAVENOUS
  Filled 2019-08-27: qty 100

## 2019-08-27 SURGICAL SUPPLY — 81 items
BASEPLATE GLENOSPHERE 25 STD (Miscellaneous) ×2 IMPLANT
BASEPLATE GLENOSPHERE 25MM STD (Miscellaneous) ×1 IMPLANT
BIT DRILL 3.2 PERIPHERAL SCREW (BIT) ×3 IMPLANT
BLADE SAW SAG 73X25 THK (BLADE) ×2
BLADE SAW SGTL 73X25 THK (BLADE) ×1 IMPLANT
BODY PROXIMAL PTC 13X132.5 (Joint) ×1 IMPLANT
BONE CEMENT GENTAMICIN (Cement) ×3 IMPLANT
BUR EGG ELITE 4.0 (BURR) ×2 IMPLANT
BUR EGG ELITE 4.0MM (BURR) ×1
CAP LOCKING COCR (Cap) ×3 IMPLANT
CEMENT BONE GENTAMICIN 40 (Cement) ×1 IMPLANT
CHLORAPREP W/TINT 26 (MISCELLANEOUS) ×6 IMPLANT
CLOSURE STERI-STRIP 1/2X4 (GAUZE/BANDAGES/DRESSINGS) ×1
CLOSURE WOUND 1/2 X4 (GAUZE/BANDAGES/DRESSINGS) ×1
CLSR STERI-STRIP ANTIMIC 1/2X4 (GAUZE/BANDAGES/DRESSINGS) ×2 IMPLANT
COOLER ICEMAN CLASSIC (MISCELLANEOUS) IMPLANT
COVER BACK TABLE 60X90IN (DRAPES) IMPLANT
COVER SURGICAL LIGHT HANDLE (MISCELLANEOUS) ×3 IMPLANT
COVER WAND RF STERILE (DRAPES) ×3 IMPLANT
DRAPE INCISE IOBAN 66X45 STRL (DRAPES) ×3 IMPLANT
DRAPE ORTHO SPLIT 77X108 STRL (DRAPES) ×4
DRAPE SHEET LG 3/4 BI-LAMINATE (DRAPES) ×6 IMPLANT
DRAPE SURG ORHT 6 SPLT 77X108 (DRAPES) ×2 IMPLANT
DRSG AQUACEL AG ADV 3.5X 6 (GAUZE/BANDAGES/DRESSINGS) ×3 IMPLANT
DRSG AQUACEL AG ADV 3.5X10 (GAUZE/BANDAGES/DRESSINGS) ×3 IMPLANT
ELECT BLADE TIP CTD 4 INCH (ELECTRODE) ×3 IMPLANT
ELECT REM PT RETURN 15FT ADLT (MISCELLANEOUS) ×3 IMPLANT
GLENOSPHERE REV SHOULDER 36 (Joint) ×3 IMPLANT
GLOVE BIO SURGEON STRL SZ 6.5 (GLOVE) ×4 IMPLANT
GLOVE BIO SURGEONS STRL SZ 6.5 (GLOVE) ×2
GLOVE BIOGEL PI IND STRL 6.5 (GLOVE) ×1 IMPLANT
GLOVE BIOGEL PI IND STRL 8 (GLOVE) ×1 IMPLANT
GLOVE BIOGEL PI INDICATOR 6.5 (GLOVE) ×2
GLOVE BIOGEL PI INDICATOR 8 (GLOVE) ×2
GLOVE ECLIPSE 8.0 STRL XLNG CF (GLOVE) ×6 IMPLANT
GOWN STRL REUS W/TWL LRG LVL3 (GOWN DISPOSABLE) ×3 IMPLANT
GOWN STRL REUS W/TWL XL LVL3 (GOWN DISPOSABLE) ×3 IMPLANT
GUIDEWIRE GLENOID 2.5X220 (WIRE) ×3 IMPLANT
HANDPIECE INTERPULSE COAX TIP (DISPOSABLE) ×2
HEMOSTAT SURGICEL 2X14 (HEMOSTASIS) IMPLANT
IMPL REVERSED SHOULDER 12T 3.5 (Shoulder) ×3 IMPLANT
INSERT HUM REVERSE SHOULDER 36 (Miscellaneous) ×3 IMPLANT
KIT BASIN (CUSTOM PROCEDURE TRAY) ×3 IMPLANT
KIT STABILIZATION SHOULDER (MISCELLANEOUS) ×3 IMPLANT
KIT TURNOVER KIT A (KITS) IMPLANT
MANIFOLD NEPTUNE II (INSTRUMENTS) ×3 IMPLANT
NEEDLE MAYO CATGUT SZ4 (NEEDLE) IMPLANT
NS IRRIG 1000ML POUR BTL (IV SOLUTION) ×3 IMPLANT
PACK SHOULDER (CUSTOM PROCEDURE TRAY) ×3 IMPLANT
PAD COLD SHLDR WRAP-ON (PAD) IMPLANT
PENCIL SMOKE EVACUATOR (MISCELLANEOUS) IMPLANT
PROXIMAL BODY PTC 13X132.5 (Joint) ×3 IMPLANT
PRT COAT DISTL STEM 13 SHOUL (Miscellaneous) ×3 IMPLANT
RESTRAINT HEAD UNIVERSAL NS (MISCELLANEOUS) ×3 IMPLANT
RESTRICTOR CEMENT (Cement) ×3 IMPLANT
SCREW 5.0X38 SMALL F/PERFORM (Screw) ×3 IMPLANT
SCREW 5.5X14 (Screw) ×6 IMPLANT
SCREW BONE SMALL REVERSED 15 (Screw) ×3 IMPLANT
SCREW PERIPHERAL 5.0X34 (Screw) ×3 IMPLANT
SCREW SHLD ASSEMBLY AEQ 20 (Spacer) ×2 IMPLANT
SET HNDPC FAN SPRY TIP SCT (DISPOSABLE) ×1 IMPLANT
SLING ULTRA III MED (ORTHOPEDIC SUPPLIES) ×3 IMPLANT
SPACER AEQUALIS 13X20 (Spacer) ×1 IMPLANT
SPACER REV AEQUALIS 11 (Spacer) ×1 IMPLANT
SPACER SHLD CMT AEQ 13X20 (Spacer) ×2 IMPLANT
STEM SHLDR DIST PRTLY COATD 13 (Miscellaneous) ×1 IMPLANT
STRIP CLOSURE SKIN 1/2X4 (GAUZE/BANDAGES/DRESSINGS) ×2 IMPLANT
SUCTION FRAZIER HANDLE 12FR (TUBING) ×2
SUCTION TUBE FRAZIER 12FR DISP (TUBING) ×1 IMPLANT
SUT ETHIBOND 2 V 37 (SUTURE) ×3 IMPLANT
SUT ETHIBOND NAB CT1 #1 30IN (SUTURE) ×3 IMPLANT
SUT FIBERWIRE #5 38 CONV NDL (SUTURE) ×12
SUT MNCRL AB 4-0 PS2 18 (SUTURE) ×3 IMPLANT
SUT VIC AB 0 CT1 36 (SUTURE) ×3 IMPLANT
SUT VIC AB 3-0 SH 27 (SUTURE) ×2
SUT VIC AB 3-0 SH 27X BRD (SUTURE) ×1 IMPLANT
SUTURE FIBERWR #5 38 CONV NDL (SUTURE) ×4 IMPLANT
SYR TOOMEY IRRIG 70ML (MISCELLANEOUS) ×3
SYRINGE TOOMEY IRRIG 70ML (MISCELLANEOUS) ×1 IMPLANT
TOWEL OR 17X26 10 PK STRL BLUE (TOWEL DISPOSABLE) ×3 IMPLANT
WATER STERILE IRR 1000ML POUR (IV SOLUTION) ×6 IMPLANT

## 2019-08-27 NOTE — Interval H&P Note (Signed)
All questions answered. Patient understands this is a high-risk procedure.

## 2019-08-27 NOTE — Op Note (Signed)
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  Patient's bone quality was extraordinarily poor and though relative was the worst that I had ever worked with.  I fear that her glenoid fixation though routine and robust with 4 screws may still pull apart due to her extraordinarily poor bone quality.  I was able to advance our guidewire by hand into the glenoid.  The nonunion proximally had very little fibrous tissue and we were able to essentially remove some fibrous tissue and free up the spike of proximal humerus opening the canal to allow passage.  Her bone quality would not support press-fit implants and thus we had to cement to get adequate fixation as we did have a small crack as we even began to place our press-fit implants.  We did cerclage this high-strength suture as we were worried that the cable would cause compression further cracking of her proximal bone.  The overall construct was robust considering the bone quality at the end.  Of note though we attempted to take all precautions and only hand pack cement and keep the patient's pressure through our anesthesia colleagues appropriately high she did have a period of hypotension that was transient.  Anesthesia assessed her and felt that she was stable to continue.  In the postoperative recovery area she seemed completely normal and conversive and anesthesia was happy with her overall recovery.  We will monitor overnight though.  If the patient fails this she is not a candidate for reimplantation based on her bone quality seen.  If she had a periprosthetic fracture would be extraordinarily difficult to get any purchase with a plate and  screws and there may be consideration for an explantation if necessary.  Her instability risk is high as there was very little proximal bone that was able to be used as the bone in the head of the humerus was essentially dead and did not bleed during our approach.  We did take wafers of bone from the tuberosities which had some bleeding return just to get some tension on the tissues around the shoulder.  We dissected out and protected the axillary nerve throughout the case the radial nerve as well away from our approach.  If the patient dislocates she would require anesthesia to be reduced as her bone quality would almost surely fracture if a close reduction attempt was made with sedation or local only.  Post-operative plan: The patient will be NWB in sling for 6 weeks with no movement until then as we are fearful of her glenoid bone quality and the risk of periprosthetic fracture or loss of fixation.  The patient will be will be admitted to observation due to medical complexity, monitoring and pain management.  DVT prophylaxis Lovenox 40 mg/day until mobilizing and then consider transition in clinic to alternative medicines.  Pain control with PRN pain medication preferring oral medicines.  Follow up plan will be scheduled in approximately 7 days for incision check and XR.  Physical therapy to start after 6 weeks for gentle range of motion only not staying on our normal protocol.  Implants: Tornier revive size 13 distal stem, 20 spacer and standard proximal body cemented in place with a 12 high offset tray and a 36 9  poly-.  Glenoid was a 25 baseplate with a long ingrowth post and 4 peripheral screws.  36 glenosphere.  Post-Op Diagnosis: Same Surgeons:Primary: Hiram Gash, MD Assistants:Caroline McBane PA-C Location: [facility] ROOM 06 Anesthesia: General with Exparel Interscalene Antibiotics: Ancef 2g preop, Vancomycin 1070m locally, 1.2 g of tobramycin locally Tourniquet time: None Estimated Blood  Loss: [id number]: Patient had transient hypotension during cementation but anesthesia felt that she was reasonable to continue and she recovered well.  No signs of abnormality in PACU my exam and the exam of the anesthesiologist. Specimens: None Implants: Implant Name Type Inv. Item Serial No. Manufacturer Lot No. LRB No. Used Action  BASEPLATE GLENOSPHERE 253MISTD - [id number] BASEPLATE GLENOSPHERE 282NOSTD  [employer] [id number] 1 Implanted  SCREW BONE SMALL REVERSED 15 - [id number] SCREW BONE SMALL REVERSED 15  [employer] [id number] Right 1 Implanted  GLENOSPHERE REV SHOULDER 36 - [id number] GLENOSPHERE REV SHOULDER 36  [employer] [id number] 1 Implanted  SCREW 5.0X38 SMALL F/PERFORM - [id number] SCREW 5.0X38 SMALL F/PERFORM  [employer]  Right 1 Implanted  SCREW 5.5X14 - [id number] SCREW 5.5X14  [employer]  Right 2 Implanted  SCREW PERIPHERAL 5.0X34 - [id number] SCREW PERIPHERAL 5.0X34  [employer]  Right 1 Implanted  cement restrictor   [id number] [employer]  Right 1 Implanted  CAP LOCKING COCR - [id number] CAP LOCKING COCR [id number]  Right 1 Implanted  SPACER REV AEQUALIS 11 - [id number] SPACER REV AEQUALIS 11 [id number]  Right 1 Implanted  SPACER AEQUALIS 13X20 - [id number] SPACER AEQUALIS 13X20 [id number]  Right 1 Implanted  PROXIMAL BODY PTC 13X132.5 - [id number] PROXIMAL BODY PTC 13X132.5 [id number]  Right 1 Implanted  PRT COAT DISTL STEM 13 SHOUL - [id number] PRT COAT DISTL STEM 13 SHOUL [id number]  Right 1 Implanted  BONE CEMENT GENTAMICIN - [id number] BONE CEMENT GENTAMICIN  DEPUY SYNTHES [id number] 1 Implanted  Reverse Tray    [id number]  Right 1 Implanted  INSERT HUM REVERSE SHOULDER 36 - [id number] INSERT HUM REVERSE SHOULDER [id number] [employer]  Right 1  Implanted    Indications for Surgery:   SLandree Fernholzis a 64y.o. female with extensive smoking history as well as multiple comorbidities with multiple nonunions of the lower extremity and a 64year-old nonunion but if we treated right proximal humerus fracture.  In light of her poor bone quality in her lower extremities her orthopedic surgeon for fracture management felt that attempts at open reduction internal fixation of her humerus would be futile.  We recommended her to see me for the possibility of arthroplasty.  I discussed with the patient and her husband at length that she is extraordinarily high risk for complication.  Benefits and risks of operative and nonoperative management were discussed prior to surgery with patient/guardian(s) and informed consent form was completed.  Infection and need for further surgery were discussed as was prosthetic stability and cuff issues.  We additionally specifically discussed risks of axillary nerve injury, infection, periprosthetic fracture, continued pain and longevity of implants prior to beginning procedure.  This patient's risk of dislocation periprosthetic fracture were discussed would be much higher than the standard.    Procedure:   The patient was identified in the preoperative holding area where the surgical site was marked. Block placed by anesthesia with exparel.  The patient was taken to  the OR where a procedural timeout was called and the above noted anesthesia was induced.  The patient was positioned beachchair on allen table with spider arm positioner.  Preoperative antibiotics were dosed.  The patient's right shoulder was prepped and draped in the usual sterile fashion.  A second preoperative timeout was called.       Began with a deltopectoral approach achieving hemostasis progressed.  We identified the cephalic vein and retracted laterally.  We identified the deltopectoral interval and opened it placing a self-retaining retractor.   We open the clavipectoral fascia at that point were able to place standard retractors.  Biceps tendon was found though there was no groove associated with it.  There was nonunion obviously below.  We did release some of the pectoralis that was attached to the distal fragment.  We tenodesed the biceps to this.  At this point we took stock of our bone.  There was a sharp spike of bone in the setting of a chronic nonunion of the distal fragment.  We took down fibrous nonunion tissue carefully using blunt dissection and were able to avoid the axillary and posterior cord which we did visualize, dissect out, and protect throughout the case.  We would like to have kept some of her proximal bone however the bone quality was so soft that I was able to use a finger to push through her articular cartilage and subchondral bone.  We removed the proximal humeral bone primarily through use of the ronguer and shelled out the tuberosities.  The bone of the proximal humerus was essentially avascular and did not bleed during the process.  She had some bleeding bone in the tuberosities that was minimal.  We are able to mobilize the rotator cuff and placed sutures at the muscle tendon junction.  At this point we then turned our attention to the glenoid.  We removed soft tissue and placed retractors again protecting the axillary nerve.  The glenoid bone was so soft I was able to advance a pin by hand.  We placed our center pin and selected a 25 sized reamer.  We reamed the face concentrically and carefully to avoid bone loss.  Bone quality was so poor that we felt that a press-fit post was necessary.  We drilled our vault for the press-fit long post and did not perforate the vault.  We selected a 25 standard baseplate with a long press-fit post and placed this in typical fashion good seating.  4 screws were placed, 2 locking and 2 nonlocking with very poor purchase in the nonlocking screws.  At this point we cleared peripheral  soft tissue and placed a 36 glenosphere without issue. We turned our attention back to the humerus.  Blunt retractors were placed again elevating the humerus out of her incision.  We then used a high-speed bur to open the sclerotic sharp nonunion canal.  We took care to use a rondure to carefully remove bone that was overhanging and may have caused this to go into varus or valgus at this point we quickly noted that the bone quality was clearly not going to be amenable to press-fit fixation.  We started to sound the canal using the revive implants and even would one of the small sound or we noted a proximal humeral fracture.  We placed a cerclage suture around this carefully avoiding the neurovascular structures as we thought that a cable would compress the bone and likely cause propagation of her fracture.  At this point  we felt that using fluoroscopic guidance to cement the stem in place would be reasonable.  We sized but did not trial due to fear of fracturing and selected a 13 revive stem with a 20 spacer in standard proximal body.  We selected our height and marker height before preparing for cement.  This point the team prepared for cement.  We irrigated the canal copiously and then placed a cement restrictor under fluoroscopic guidance.  We were very careful to avoid fracture of the proximal humerus shaft.  At that point we were fearful that pressurizing cement may cause her to have abnormalities and we can pack cement into her canal gently.  Once cementing was appropriate we placed our stem.  It was at this time the patient became hypotensive and anesthesia assessed the patient felt that she was stable to continue.  After number stabilized we continue the case.  We waited 15 minutes for the cement to harden placing our trial implants.  We selected a 12 mm high offset tray for 36 glenosphere.  At this point we prepared for our final implantation.  We passed for #5 FiberWire's around the greater  tuberosity.  Once her new implants were in place we passed 1 limb of each of these around the stem itself .  The lesser tuberosity fragment was mobilized and we again visualized the axillary nerve was protected.  We have placed a vertical stabilization suture in the shaft prior to cementing.  We used the sutures to repair the greater and lesser tuberosity back to the shaft and the proximal aspect of the implant and a cerclage fashion achieving good fixation and good appropriate position of the tuberosities on x-ray.  Vertical stabilization suture was passed to avoid proximal migration of possible.  We did not feel it was appropriate to repair all of this to avoid her high risk of dislocation even though we knew that there was a significant risk that her tuberosities would not heal that instability in this patient would be a much worse option potential for some slight impingement.  We had a stable reduction at the end of the case and good fixation appearance on fluoroscopic imaging.  We irrigated copiously and placed local vancomycin and tobramycin powder.  Incisions were closed in a multilayer fashion with absorbable suture.  An Aquasol dressing was placed.  A sling was placed.  Patient was woken and taken to PACU in stable condition where she was reexamined and felt to be stable for the floor.   Noemi Chapel, PA-C, present and scrubbed throughout the case, critical for completion in a timely fashion, and for retraction, instrumentation, closure.

## 2019-08-27 NOTE — Addendum Note (Signed)
Addendum  created 08/27/19 1233 by Wynonia Sours, CRNA   Flowsheet accepted, Intraprocedure Flowsheets edited

## 2019-08-27 NOTE — Anesthesia Postprocedure Evaluation (Signed)
Anesthesia Post Note  Patient: Teaghan Melrose  Procedure(s) Performed: REVERSE SHOULDER ARTHROPLASTY (Right Shoulder)     Patient location during evaluation: PACU Anesthesia Type: General Level of consciousness: awake and alert Pain management: pain level controlled Vital Signs Assessment: post-procedure vital signs reviewed and stable Respiratory status: spontaneous breathing, nonlabored ventilation, respiratory function stable and patient connected to nasal cannula oxygen Cardiovascular status: blood pressure returned to baseline and stable Postop Assessment: no apparent nausea or vomiting Anesthetic complications: no    Last Vitals:  Vitals:   08/27/19 1200 08/27/19 1215  BP: 114/70 103/68  Pulse: 90 92  Resp: 19 (!) 22  Temp:  36.6 C  SpO2: 94% 94%    Last Pain:  Vitals:   08/27/19 1215  TempSrc:   PainSc: 0-No pain                 Kennieth Rad

## 2019-08-27 NOTE — Transfer of Care (Signed)
Immediate Anesthesia Transfer of Care Note  Patient: Melanie Parsons  Procedure(s) Performed: REVERSE SHOULDER ARTHROPLASTY (Right Shoulder)  Patient Location: PACU  Anesthesia Type:GA combined with regional for post-op pain  Level of Consciousness: awake, oriented, patient cooperative and responds to stimulation  Airway & Oxygen Therapy: Patient Spontanous Breathing and Patient connected to face mask oxygen  Post-op Assessment: Report given to RN and Post -op Vital signs reviewed and stable  Post vital signs: Reviewed and stable  Last Vitals:  Vitals Value Taken Time  BP 140/79 08/27/19 1124  Temp    Pulse 105 08/27/19 1126  Resp 23 08/27/19 1126  SpO2 100 % 08/27/19 1126  Vitals shown include unvalidated device data.  Last Pain:  Vitals:   08/27/19 0728  TempSrc: Oral  PainSc:          Complications: No apparent anesthesia complications

## 2019-08-27 NOTE — Plan of Care (Signed)
Plan of care 

## 2019-08-27 NOTE — Progress Notes (Signed)
Notified C McBane of Patients B/P will await orders D Susann Givens RN

## 2019-08-27 NOTE — Anesthesia Procedure Notes (Signed)
Anesthesia Regional Block: Interscalene brachial plexus block   Pre-Anesthetic Checklist: ,, timeout performed, Correct Patient, Correct Site, Correct Laterality, Correct Procedure, Correct Position, site marked, Risks and benefits discussed,  Surgical consent,  Pre-op evaluation,  At surgeon's request and post-op pain management  Laterality: Right  Prep: chloraprep       Needles:  Injection technique: Single-shot  Needle Type: Echogenic Stimulator Needle     Needle Length: 9cm  Needle Gauge: 21     Additional Needles:   Procedures:, nerve stimulator,,, ultrasound used (permanent image in chart),,,,   Nerve Stimulator or Paresthesia:  Response: deltoid and bicep, 0.5 mA,   Additional Responses:   Narrative:  Start time: 08/27/2019 7:50 AM End time: 08/27/2019 7:56 AM Injection made incrementally with aspirations every 5 mL.  Performed by: Personally  Anesthesiologist: Marcene Duos, MD

## 2019-08-27 NOTE — Anesthesia Procedure Notes (Signed)
Procedure Name: Intubation Date/Time: 08/27/2019 8:34 AM Performed by: West Pugh, CRNA Pre-anesthesia Checklist: Patient identified, Emergency Drugs available, Suction available, Patient being monitored and Timeout performed Patient Re-evaluated:Patient Re-evaluated prior to induction Oxygen Delivery Method: Circle system utilized Preoxygenation: Pre-oxygenation with 100% oxygen Induction Type: IV induction Ventilation: Mask ventilation without difficulty Laryngoscope Size: Mac and 3 Grade View: Grade I Tube type: Oral Tube size: 7.0 mm Number of attempts: 1 Airway Equipment and Method: Stylet Placement Confirmation: ETT inserted through vocal cords under direct vision,  positive ETCO2,  CO2 detector and breath sounds checked- equal and bilateral Secured at: 21 cm Tube secured with: Tape Dental Injury: Teeth and Oropharynx as per pre-operative assessment

## 2019-08-28 DIAGNOSIS — I959 Hypotension, unspecified: Secondary | ICD-10-CM

## 2019-08-28 DIAGNOSIS — M84421A Pathological fracture, right humerus, initial encounter for fracture: Secondary | ICD-10-CM | POA: Diagnosis not present

## 2019-08-28 LAB — BASIC METABOLIC PANEL
Anion gap: 7 (ref 5–15)
BUN: 15 mg/dL (ref 8–23)
CO2: 25 mmol/L (ref 22–32)
Calcium: 8.4 mg/dL — ABNORMAL LOW (ref 8.9–10.3)
Chloride: 103 mmol/L (ref 98–111)
Creatinine, Ser: 0.65 mg/dL (ref 0.44–1.00)
GFR calc Af Amer: >60 mL/min (ref >60)
GFR calc non Af Amer: >60 mL/min (ref >60)
Glucose, Bld: 101 mg/dL — ABNORMAL HIGH (ref 70–99)
Potassium: 4.3 mmol/L (ref 3.5–5.1)
Sodium: 135 mmol/L (ref 135–145)

## 2019-08-28 LAB — CBC
HCT: 29.5 % — ABNORMAL LOW (ref 36.0–46.0)
Hemoglobin: 9.1 g/dL — ABNORMAL LOW (ref 12.0–15.0)
MCH: 25.3 pg — ABNORMAL LOW (ref 26.0–34.0)
MCHC: 30.8 g/dL (ref 30.0–36.0)
MCV: 82.2 fL (ref 80.0–100.0)
Platelets: 402 10*3/uL — ABNORMAL HIGH (ref 150–400)
RBC: 3.59 MIL/uL — ABNORMAL LOW (ref 3.87–5.11)
RDW: 18.4 % — ABNORMAL HIGH (ref 11.5–15.5)
WBC: 12.4 10*3/uL — ABNORMAL HIGH (ref 4.0–10.5)
nRBC: 0 % (ref 0.0–0.2)

## 2019-08-28 LAB — URINALYSIS, ROUTINE W REFLEX MICROSCOPIC
Bilirubin Urine: NEGATIVE
Glucose, UA: NEGATIVE mg/dL
Hgb urine dipstick: NEGATIVE
Ketones, ur: NEGATIVE mg/dL
Nitrite: NEGATIVE
Protein, ur: NEGATIVE mg/dL
Specific Gravity, Urine: 1.006 (ref 1.005–1.030)
pH: 6 (ref 5.0–8.0)

## 2019-08-28 LAB — GLUCOSE, CAPILLARY: Glucose-Capillary: 84 mg/dL (ref 70–99)

## 2019-08-28 MED ORDER — ONDANSETRON HCL 4 MG PO TABS: 4.0000 mg | ORAL_TABLET | Freq: Three times a day (TID) | ORAL | 1 refills | Status: AC | PRN

## 2019-08-28 MED ORDER — CELECOXIB 100 MG PO CAPS
100.0000 mg | ORAL_CAPSULE | Freq: Two times a day (BID) | ORAL | 0 refills | Status: AC
Start: 2019-08-28 — End: 2019-09-27

## 2019-08-28 MED ORDER — LACTATED RINGERS IV BOLUS
1000.0000 mL | Freq: Once | INTRAVENOUS | Status: AC
  Administered 2019-08-28: 1000 mL via INTRAVENOUS

## 2019-08-28 MED ORDER — ACETAMINOPHEN 500 MG PO TABS
1000.0000 mg | ORAL_TABLET | Freq: Three times a day (TID) | ORAL | Status: DC
  Administered 2019-08-29 (×3): 1000 mg via ORAL
  Filled 2019-08-28 (×3): qty 2

## 2019-08-28 MED ORDER — ACETAMINOPHEN 500 MG PO TABS
1000.0000 mg | ORAL_TABLET | Freq: Three times a day (TID) | ORAL | 0 refills | Status: AC
Start: 2019-08-28 — End: 2019-09-11

## 2019-08-28 MED ORDER — OXYCODONE HCL 5 MG PO TABS: ORAL_TABLET | ORAL | 0 refills | Status: AC

## 2019-08-28 MED ORDER — LACTATED RINGERS IV SOLN: INTRAVENOUS | Status: AC

## 2019-08-28 NOTE — Evaluation (Signed)
Occupational Therapy Evaluation Patient Details Name: Melanie Parsons MRN: [id number] DOB: Mar 18, 1956 Today's Date: 08/28/2019    History of Present Illness Patient is a 64 year old female admitted with Right proximal humerus nonunion and is s/p Right reverse total Shoulder Arthroplasty   Clinical Impression   Patient educated in post op shoulder instructions, exercises and compensatory strategies with handouts provided. Patient does appear to have decreased short term memory asking questions OT had addressed already on multiple occasions. Patient states spouse is going to stay home with patient initially "until all this medication wears off and I feel more clear headed." Patient would benefit from [facility] to maximize education of compensatory strategies to patient/caregiver and safety with mobility, transfers, self care.    Follow Up Recommendations  Home health OT;Supervision/Assistance - 24 hour;Follow surgeon's recommendation for DC plan and follow-up therapies    Equipment Recommendations  None recommended by OT(pt has necessary DME)       Precautions / Restrictions Precautions Precautions: Shoulder Type of Shoulder Precautions: AROM elbow wrist and hand ok, P/AROM shoulder no Shoulder Interventions: Shoulder abduction pillow;Shoulder sling/immobilizer;Off for dressing/bathing/exercises Precaution Booklet Issued: Yes (comment) Required Braces or Orthoses: Sling Restrictions Weight Bearing Restrictions: Yes RUE Weight Bearing: Non weight bearing      Mobility Bed Mobility Overal bed mobility: Needs Assistance Bed Mobility: Supine to Sit     Supine to sit: Min assist;Mod assist;HOB elevated     General bed mobility comments: min/mod to elevate trunk, pt reports will sleep in lift chair initially   Transfers Overall transfer level: Needs assistance Equipment used: 1 person hand held assist Transfers: Sit to/from Stand;Stand Pivot Transfers Sit to Stand: Min assist;Mod  assist Stand pivot transfers: Min assist       General transfer comment: assist to power up to standing, min A to pivot into chair for safety with balance    Balance Overall balance assessment: Needs assistance Sitting-balance support: No upper extremity supported;Feet supported Sitting balance-Leahy Scale: Good     Standing balance support: Single extremity supported;During functional activity Standing balance-Leahy Scale: Poor Standing balance comment: reliant on external support, furniture walks at home                           ADL either performed or assessed with clinical judgement   ADL Overall ADL's : Needs assistance/impaired Eating/Feeding: Set up;Sitting   Grooming: Set up;Sitting   Upper Body Bathing: Minimal assistance;Sitting   Lower Body Bathing: Sit to/from stand;Sitting/lateral leans;Moderate assistance   Upper Body Dressing : Moderate assistance;Sitting Upper Body Dressing Details (indicate cue type and reason): educated pt on technique, clothes not present to practice Lower Body Dressing: Moderate assistance;Sitting/lateral leans;Sit to/from stand   Toilet Transfer: Minimal assistance;Moderate assistance;Ambulation;Stand-pivot Toilet Transfer Details (indicate cue type and reason): simulated with transfer to recliner, min/mod A to power up to standing then min A to pivot into recliner Toileting- Clothing Manipulation and Hygiene: Minimal assistance;Moderate assistance       Functional mobility during ADLs: Minimal assistance;Moderate assistance                    Pertinent Vitals/Pain Pain Assessment: Faces Faces Pain Scale: Hurts a little bit Pain Location: R shoulder and neck Pain Descriptors / Indicators: Aching;Grimacing;Discomfort Pain Intervention(s): Repositioned;Premedicated before session     Hand Dominance Right   Extremity/Trunk Assessment Upper Extremity Assessment Upper Extremity Assessment: RUE  deficits/detail RUE Deficits / Details: + nerve block, AROM digits  intact   Lower Extremity Assessment Lower Extremity Assessment: Generalized weakness;LLE deficits/detail LLE Deficits / Details: leg length discrepancy d/t multiple femur sx   Cervical / Trunk Assessment Cervical / Trunk Assessment: Kyphotic   Communication Communication Communication: No difficulties   Cognition Arousal/Alertness: Awake/alert Behavior During Therapy: WFL for tasks assessed/performed Overall Cognitive Status: No family/caregiver present to determine baseline cognitive functioning                                 General Comments: patient appears to have decreased short term memory, asking questions about taking sling off, getting dressed after OT had educated       Exercises Exercises: Shoulder   Shoulder Instructions Shoulder Instructions Donning/doffing shirt without moving shoulder: Patient able to independently direct caregiver Method for sponge bathing under operated UE: Patient able to independently direct caregiver Donning/doffing sling/immobilizer: Patient able to independently direct caregiver Correct positioning of sling/immobilizer: Patient able to independently direct caregiver ROM for elbow, wrist and digits of operated UE: Independent;Patient able to independently direct caregiver Sling wearing schedule (on at all times/off for ADL's): Independent;Patient able to independently direct caregiver Proper positioning of operated UE when showering: Patient able to independently direct caregiver Positioning of UE while sleeping: Patient able to independently direct caregiver    Home Living Family/patient expects to be discharged to:: Private residence Living Arrangements: Spouse/significant other Available Help at Discharge: Family Type of Home: House Home Access: Ramped entrance     Home Layout: One level;Other (Comment)(patient does not use basement)     Bathroom  Shower/Tub: Occupational psychologist: Handicapped height Bathroom Accessibility: Yes How Accessible: Accessible via wheelchair Home Equipment: [place] - 2 wheels;Bedside commode;Hand held shower head;Shower seat;Grab bars - tub/shower;Other (comment);Hospital bed(lift chair)   Additional Comments: pt reports DTR gets home by 2pm, spouse plans on taking ~1 week off until patient comfortable home alone      Prior Functioning/Environment Level of Independence: Independent with assistive device(s)        Comments: pt reports using wheelchair or "hobbling" short distances without assist, spouse supervises when bathing in shower         OT Problem List: Pain;Impaired UE functional use;Impaired balance (sitting and/or standing);Decreased safety awareness      OT Treatment/Interventions: Self-care/ADL training;Therapeutic exercise;DME and/or AE instruction;Therapeutic activities;Patient/family education;Balance training    OT Goals(Current goals can be found in the care plan section) Acute Rehab OT Goals Patient Stated Goal: to go home OT Goal Formulation: With patient Time For Goal Achievement: 09/11/19 Potential to Achieve Goals: Good  OT Frequency: Min 3X/week    AM-PAC OT "6 Clicks" Daily Activity     Outcome Measure Help from another person eating meals?: A Little Help from another person taking care of personal grooming?: A Little Help from another person toileting, which includes using toliet, bedpan, or urinal?: A Lot Help from another person bathing (including washing, rinsing, drying)?: A Lot Help from another person to put on and taking off regular upper body clothing?: A Lot Help from another person to put on and taking off regular lower body clothing?: A Lot 6 Click Score: 14   End of Session Equipment Utilized During Treatment: Other (comment)(sling)  Activity Tolerance: Patient tolerated treatment well Patient left: in chair;with call bell/phone within  reach;with chair alarm set  OT Visit Diagnosis: Unsteadiness on feet (R26.81);Other abnormalities of gait and mobility (R26.89);Pain Pain - Right/Left: Right Pain - part  of body: Shoulder                Time: 1443-1540 OT Time Calculation (min): 36 min Charges:  OT General Charges $OT Visit: 1 Visit OT Evaluation $OT Eval Moderate Complexity: 1 Mod  Melanie Parsons OT Pager: [phone]  Melanie Parsons 08/28/2019, 8:54 AM

## 2019-08-28 NOTE — Consult Note (Signed)
Medical Consultation   Melanie Parsons  BJY:[id number]  DOB: Dec 12, 1955  DOA: 08/27/2019  PCP: [name], MD  Outpatient Specialists: orthopedics  Requesting physician:Dr Everardo Pacific  Reason for consultation: Hypotension with dizziness  History of Present Illness: Melanie Parsons is an 64 y.o. female past medical history of anxiety , arthritis, diabetes mellitus not on meds, GERD, history of low blood pressure who is a status post scheduled procedure for right proximal humerus nonunion in the setting of poor bone quality who underwent right reverse total shoulder arthroplasty, right proximal humerus nonunion takedown on 08/27/19 and was planned for discharge today but  She felt dizzy and was hypotensive  and hospitalist service was consulted. Apparently patient does have low blood pressure normally, not on antihypertensive, was hypotensive yesterday. Today was the first time she got up after surgery with OT and was sitting on the bedside chair was dizzy and hypotensive 63/54.  1 L bolus RL given and blood pressure has improved to 100s.  Yesterday she was hypotensive around 2:00 and received 1 L bolus.  Reports ever since her left femur surgery she runs low, last week at PCP office her blood pressure was in 80s on recheck improved to 90s.  Currently she is saturating 97% on room air. Patient otherwise denies any nausea, vomiting, chest pain, shortness of breath, fever, chills, headache, focal weakness, numbness tingling, speech difficulties  Impression/Recommendations  Fracture of proximal humerus with nonunion: Status post  right reverse total shoulder arthroplasty, right proximal humerus nonunion takedown on 08/27/19. On celecoxib and aspirin 325 for DVT prophylaxis, plan as per primary team  Symptomatic hypotension in 60s- s/p 1 L bolus, BP improved to 100/65.I would keep her on Ringer's lactate at 50 mill per hour, encourage po, mobilize slowly.Check  orthostatic blood pressure, monitor  to make sure she has no recurrent of hypotension.BMI is very low at 15.7 suspect it is due to low weight, n.p.o. operative intervention, and with some baseline low blood pressure at home. Her BMP shows a stable BUN/creatinine and reassuring. No evidence of fever.  Denies chest pain cough fever chills.  Will order UA.  Last UA is abnormal few days ago.  Mild leukocytosis ordered CBC for the morning.  Anxiety on Xanax  Diabetes mellitus not on meds:  His last hba1c was 5.6 in 10/22/18. Recommend diet control.   GERD-on ppi  Body mass index is 15.78 kg/m.   DVT prophylaxis:  SCD/asa Code Status: Full code Family Communication: Plan of care including tests being ordered have been discussed with the patient who indicate understanding and agree with the plan.  Thank you for this consultation.  Our [facility] hospitalist team will follow the patient with you in am.  Review of Systems: All systems were reviewed and were negative except as mentioned in HPI above. Negative for fever Negative for chest pain Negative for shortness of breath  Past Medical History: Past Medical History:  Diagnosis Date  . Anxiety   . Arthritis    "joints" (12/25/2017)  . Diabetes mellitus without complication (HCC)    type 2  no meds  . GERD (gastroesophageal reflux disease)   . History of blood transfusion    "related RX I took; made my have my period q 2 wks"  . Migraine    "had one 3 days last week" (12/25/2017)  . Pneumonia    "walking pneumonia one times" (12/25/2017)  .  Urinary tract infection 12/27/2017  . Wears glasses   . Wears partial dentures     Past Surgical History: Past Surgical History:  Procedure Laterality Date  . APPENDECTOMY  1990  . FEMUR FRACTURE SURGERY Left   . FRACTURE SURGERY    . HARDWARE REMOVAL Left 12/26/2017   Procedure: HARDWARE REMOVAL;  Surgeon: Roby Lofts, MD;  Location: [facility] OR;  Service: Orthopedics;  Laterality: Left;  removal of  plate, screws and cables.   . JOINT REPLACEMENT    . MULTIPLE TOOTH EXTRACTIONS    . ORIF FEMUR FRACTURE Left 10/24/2018   Procedure: OPEN REDUCTION INTERNAL FIXATION FEMORAL NONUNION;  Surgeon: Roby Lofts, MD;  Location: [facility] OR;  Service: Orthopedics;  Laterality: Left;  . ORIF PERIPROSTHETIC FRACTURE Left 12/26/2017   Procedure: OPEN REDUCTION INTERNAL FIXATION (ORIF) PERIPROSTHETIC FRACTURE;  Surgeon: Roby Lofts, MD;  Location: [facility] OR;  Service: Orthopedics;  Laterality: Left;  . SPLENECTOMY, TOTAL  1990  . TOTAL HIP ARTHROPLASTY Left 2016  . WRIST FRACTURE SURGERY Right    "put in titanium rod"     Allergies:   Allergies  Allergen Reactions  . Penicillins Itching and Rash    Tolerated Cephalosporin Date: 08/27/19.      Social History:  reports that she has been smoking cigarettes. She has a 15.00 pack-year smoking history. She has never used smokeless tobacco. She reports previous alcohol use. She reports that she does not use drugs.   Family History: History reviewed. No pertinent family history.  Physical Exam: Vitals:   08/28/19 0118 08/28/19 0458 08/28/19 1016 08/28/19 1213  BP: 107/73 105/69 (!) 73/47 100/66  Pulse: 78 74 89 85  Resp: 16 15 20 18   Temp: 98.1 F (36.7 C) 97.9 F (36.6 C) (!) 97.4 F (36.3 C)   TempSrc: Oral Oral Oral   SpO2: 97% 98% 97% 97%  Weight:      Height:        General exam: AAOx3,NAD, comfortable pleasant. HEENT:Oral mucosa moist, Ear/Nose WNL grossly, dentition normal. Respiratory system: Bilaterally clear, NT,no use of accessory muscle Cardiovascular system: S1 & S2 +, No JVD, regular RR. Gastrointestinal system: Abdomen soft, NT,ND, BS+ Nervous System:Alert, awake, moving extremities and grossly nonfocal Extremities: Right shoulder in sling/support. Dressing + no edema, distal peripheral pulses palpable.  Skin: No rashes,no icterus. MSK: Normal muscle bulk,tone, power   Data reviewed:  I have personally reviewed  following labs and imaging studies Labs:  CBC: Recent Labs  Lab 08/27/19 1618 08/28/19 0320  WBC 12.1* 12.4*  HGB 10.9* 9.1*  HCT 34.7* 29.5*  MCV 80.5 82.2  PLT 521* 402*    Basic Metabolic Panel: Recent Labs  Lab 08/27/19 1618 08/28/19 0320  NA  --  135  K  --  4.3  CL  --  103  CO2  --  25  GLUCOSE  --  101*  BUN  --  15  CREATININE 0.91 0.65  CALCIUM  --  8.4*   GFR Estimated Creatinine Clearance: 56 mL/min (by C-G formula based on SCr of 0.65 mg/dL). Liver Function Tests: No results for input(s): AST, ALT, ALKPHOS, BILITOT, PROT, ALBUMIN in the last 168 hours. No results for input(s): LIPASE, AMYLASE in the last 168 hours. No results for input(s): AMMONIA in the last 168 hours. Coagulation profile No results for input(s): INR, PROTIME in the last 168 hours.  Cardiac Enzymes: No results for input(s): CKTOTAL, CKMB, CKMBINDEX, TROPONINI in the last 168 hours. BNP: Invalid input(s):  POCBNP CBG: Recent Labs  Lab 08/27/19 0710 08/27/19 1126  GLUCAP 84 113*   D-Dimer No results for input(s): DDIMER in the last 72 hours. Hgb A1c No results for input(s): HGBA1C in the last 72 hours. Lipid Profile No results for input(s): CHOL, HDL, LDLCALC, TRIG, CHOLHDL, LDLDIRECT in the last 72 hours. Thyroid function studies No results for input(s): TSH, T4TOTAL, T3FREE, THYROIDAB in the last 72 hours.  Invalid input(s): FREET3 Anemia work up No results for input(s): VITAMINB12, FOLATE, FERRITIN, TIBC, IRON, RETICCTPCT in the last 72 hours. Urinalysis    Component Value Date/Time   COLORURINE YELLOW 10/23/2018 1645   APPEARANCEUR HAZY (A) 10/23/2018 1645   LABSPEC 1.019 10/23/2018 1645   PHURINE 5.0 10/23/2018 1645   GLUCOSEU NEGATIVE 10/23/2018 1645   HGBUR SMALL (A) 10/23/2018 [place] 10/23/2018 1645   KETONESUR 5 (A) 10/23/2018 1645   PROTEINUR NEGATIVE 10/23/2018 1645   NITRITE POSITIVE (A) 10/23/2018 1645   LEUKOCYTESUR LARGE (A)  10/23/2018 1645     Microbiology Recent Results (from the past 240 hour(s))  SARS CORONAVIRUS 2 (TAT 6-24 HRS) Nasopharyngeal Nasopharyngeal Swab     Status: None   Collection Time: 08/23/19  1:29 PM   Specimen: Nasopharyngeal Swab  Result Value Ref Range Status   SARS Coronavirus 2 NEGATIVE NEGATIVE Final    Comment: (NOTE) SARS-CoV-2 target nucleic acids are NOT DETECTED. The SARS-CoV-2 RNA is generally detectable in upper and lower respiratory specimens during the acute phase of infection. Negative results do not preclude SARS-CoV-2 infection, do not rule out co-infections with other pathogens, and should not be used as the sole basis for treatment or other patient management decisions. Negative results must be combined with clinical observations, patient history, and epidemiological information. The expected result is Negative. Fact Sheet for Patients: SugarRoll.be Fact Sheet for Healthcare Providers: https://www.woods-mathews.com/ This test is not yet approved or cleared by the [place] FDA and  has been authorized for detection and/or diagnosis of SARS-CoV-2 by FDA under an Emergency Use Authorization (EUA). This EUA will remain  in effect (meaning this test can be used) for the duration of the COVID-19 declaration under Section 56 4(b)(1) of the Act, 21 U.S.C. section 360bbb-3(b)(1), unless the authorization is terminated or revoked sooner. Performed at [facility], [place] [address], [place], [place] [address]        Inpatient Medications:   Scheduled Meds: . acetaminophen  1,000 mg Oral Q8H  . ALPRAZolam  1 mg Oral TID  . aspirin  325 mg Oral Daily  . celecoxib  200 mg Oral BID  . docusate sodium  100 mg Oral BID  . enoxaparin (LOVENOX) injection  40 mg Subcutaneous Q24H  . mirtazapine  30 mg Oral QHS  . pantoprazole  40 mg Oral Daily   Continuous Infusions: . methocarbamol (ROBAXIN) IV        Radiological Exams on Admission: DG Shoulder Right  Addendum Date: 08/27/2019   ADDENDUM REPORT: 08/27/2019 12:28 FLUOROSCOPY TIME:  0 minutes 16 seconds; 4 acquired images Electronically Signed   By: Lowella Grip III M.D.   On: 08/27/2019 12:28   Result Date: 08/27/2019 CLINICAL DATA:  Status post right shoulder arthroplasty EXAM: RIGHT SHOULDER - 1 VIEW COMPARISON:  CT right shoulder Aug 22, 2019 FINDINGS: Frontal view obtained. There is a total shoulder replacement with prosthetic components well-seated. Bones osteoporotic. No acute fracture or dislocation. Evidence of old trauma involving the lateral right clavicle. Air in the joint is  an expected postoperative finding. IMPRESSION: Total shoulder replacement with prosthetic components well-seated on frontal view. Evidence of old trauma lateral right clavicle. Intra-articular air is an expected postoperative finding. No acute fracture or dislocation evident. Electronically Signed: By: Bretta Bang III M.D. On: 08/27/2019 11:57   DG Shoulder Right Port  Result Date: 08/27/2019 CLINICAL DATA:  Status post total shoulder replacement EXAM: PORTABLE RIGHT SHOULDER COMPARISON:  Intraoperative images obtained earlier in the day and right shoulder CT Aug 22, 2019 FINDINGS: Frontal view obtained. There is a total shoulder replacement on the right with prosthetic components well-seated. Evidence of old trauma with remodeling lateral right clavicle. No acute fracture or dislocation. Air within the joint is an expected postoperative finding. Visualized right lung clear. IMPRESSION: Total shoulder replacement with prosthetic components well-seated on frontal view. Old trauma with remodeling lateral right clavicle. No acute fracture or dislocation. Electronically Signed   By: Bretta Bang III M.D.   On: 08/27/2019 12:28   DG C-Arm 1-60 Min-No Report  Result Date: 08/27/2019 Fluoroscopy was utilized by the requesting physician.  No radiographic  interpretation.    Lanae Boast M.D. [facility] [phone] 08/28/2019, 12:22 PM  Please Note: This patient record was dictated using Dragon software. Chart creation errors have been sought, but may not always have been located. Such creation errors do not reflect on the Standard of Medical Care.

## 2019-08-28 NOTE — Progress Notes (Signed)
   ORTHOPAEDIC PROGRESS NOTE  s/p Procedure(s): REVERSE SHOULDER ARTHROPLASTY on 08/27/2019 by Dr. Everardo Pacific  SUBJECTIVE: Patient reports mild pain about operative site. Her arm still feels numb from the nerve block. No chest pain. No SOB. No nausea/vomiting. No other complaints.  OBJECTIVE: PE: Right upper extremity: Dressing CDI and sling well fitting,  full and painless ROM throughout hand with DPC of 0.  Axillary nerve sensation/motor altered in setting of block and unable to be fully tested.  Distal motor and sensory altered in setting of block. She was able to wiggle her fingers. Warm well perfused hand.    Vitals:   08/28/19 1317 08/28/19 1422  BP:  102/62  Pulse: 87 92  Resp:  18  Temp:  98.4 F (36.9 C)  SpO2: 97% 100%     ASSESSMENT: Melanie Parsons is a 64 y.o. 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   PLAN: Weightbearing: NWB RUE Insicional and dressing care: Reinforce dressings as needed Orthopedic device(s): Sling Showering: With assistance VTE prophylaxis: Lovenox 40mg  qd  Pain control: PRN pain medications, minimize narcotics as able  Patient had episode of hypotension during surgery yesterday. She recovered well with this. She received a bolus of fluids for low blood pressure (90/65) the afternoon after surgery. She was doing well this morning, however while working with OT she became hypotensive. Blood pressure recorded as 63/54. She received 1 L bolus. BP improved to 100/65. Medicine was consulted due to recurrent hypotensive episodes. Patient reports she normally has a lower blood pressure. Medicine team recommended continuing fluids and rechecking orthostatics. Medicine ordered UA and CBC recheck in the morning. Will monitor the patient for an additional night to ensure she is safe and stable to discharge home.    Follow - up plan: 1 week in office for incision check and x-ray Contact  information:  Dr. , [facility] PA-C    Melanie Parsons, Melanie Parsons 08/28/2019

## 2019-08-28 NOTE — Progress Notes (Signed)
   08/28/19 1016  Vitals  Temp (!) 97.4 F (36.3 C)  Temp Source Oral  BP (!) 73/47  MAP (mmHg) (!) 56  BP Location Left Arm  BP Method Automatic  Patient Position (if appropriate) Sitting  Pulse Rate 89  Pulse Rate Source Monitor  Resp 20  Oxygen Therapy  SpO2 97 %  O2 Device Nasal Cannula  O2 Flow Rate (L/min) 2 L/min

## 2019-08-28 NOTE — Progress Notes (Signed)
   08/28/19 1016  MEWS Score  MEWS Temp 0  MEWS Systolic 2  MEWS Pulse 0  MEWS RR 0  MEWS LOC 0  MEWS Score 2  MEWS Score Color Yellow  Provider Notification  Provider Name/Title Timoteo Ace PA  Date Provider Notified 08/28/19  Time Provider Notified 1015  Notification Type Call  Notification Reason Change in status  Response See new orders  Date of Provider Response 08/28/19  Time of Provider Response 1025

## 2019-08-28 NOTE — Discharge Summary (Addendum)
Patient ID: Melanie Parsons MRN: [id number] DOB/AGE: [birth year] 64 y.o.  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 Anxiety   . Arthritis    "joints" (12/25/2017)  . Diabetes mellitus without complication ([place])    type 2  no meds  . GERD (gastroesophageal reflux disease)   . History of blood transfusion    "related RX I took; made my have my period q 2 wks"  . Migraine    "had one 3 days last week" (12/25/2017)  . Pneumonia    "walking pneumonia one times" (12/25/2017)  . Urinary tract infection 12/27/2017  . Wears glasses   . Wears partial dentures     Procedures Performed:  - Right reverse total Shoulder Arthroplasty - Right proximal humerus nonunion takedown  Discharged Condition: stable  Hospital Course: Patient brought in to [facility] for scheduled procedure.  She tolerated procedure well. She did have a period of hypotension that was transient. She did well in PACU and anesthesia was happy with her overall recovery. She was kept for monitoring overnight for pain control and medical monitoring postop. She did have a recorded blood pressure once she was admitted to the floor. Recorded blood pressure 95/60. Patient does report she normally has a lower blood pressure. She was given a bolus of fluid and blood pressure stabilized.  She was doing well the morning after surgery, however while working with OT she became hypotensive. Blood pressure recorded as 63/54. She received 1 L bolus. BP improved to 100/65. Medicine was consulted due to recurrent hypotensive episodes. Patient reports she normally has a lower blood pressure. Medicine team recommended continuing fluids and  rechecking orthostatics. Patient's oxygen saturation decreased to 89% during the night while patient was on room air. She was placed back on nasal cannula. Elevated heart rate was also noted during the night. Patient reports she was having increased in pain during the night as well as anxiety, but denied any chest pain or shortness of breath at that time. Around 2:00 am the patient's blood pressure decreased to 84/60. She received 500 mL bolus. Hgb 7.7 this morning likely acute blood loss anemia after surgery. BMP reassuring this morning. Nurse informed me of patient's BP  at 445 am which was 89/63 and asked about an additional bolus. Did not want to overload the patient with fluids as this seems to be the patient's baseline blood pressure. Recommending continuing current LR rate of 50 ml/hr. Her blood pressure has been recorded as 95/64 and 107/69 since. Stable blood pressure when working with OT today. Patient denies any shortness of breath, chest pain, fever, chills, or dizziness. Patient eager to get home. Medicine team agreed patient stable for discharge home. She will follow-up with her PCP next week to repeat lab work. Patient was instructed on specific activity restrictions and all questions were answered. She will be discharged home with her husband. Post-op medications reviewed. Patient agreed with decision for discharge.   Consults: OT, [facility] team  Treatments: Surgery  Discharge Exam: General: AAOx3. NAD. Sitting up comfortably in hospital bed.  Right upper extremity: Dressing CDI and sling well fitting,  full and painless ROM throughout hand with DPC of 0. + Motor in  AIN, PIN, Ulnar distributions. Axillary nerve sensation preserved and symmetric.  Sensation intact in medial, radial, and ulnar distributions. Well perfused digits.    Disposition: Discharge disposition: 01-Home or Self Care     Follow-up: 1 week in office with Dr. Everardo Pacific  Discharge Instructions    Call MD for:   difficulty breathing, headache or visual disturbances   Complete by: As directed    Call MD for:  redness, tenderness, or signs of infection (pain, swelling, redness, odor or green/yellow discharge around incision site)   Complete by: As directed    Call MD for:  severe uncontrolled pain   Complete by: As directed    Call MD for:  temperature >100.4   Complete by: As directed    Diet - low sodium heart healthy   Complete by: As directed    Discharge instructions   Complete by: As directed    Ramond Marrow MD, MPH Alfonse Alpers, PA-C [facility] [address], [address] [phone] (tel)   [phone] (fax)   POST-OPERATIVE INSTRUCTIONS - TOTAL SHOULDER REPLACEMENT    WOUND CARE - You may leave the operative dressing in place until your follow-up appointment. - KEEP THE INCISIONS CLEAN AND DRY. - There may be a small amount of fluid/bleeding leaking at the surgical site. This is normal after surgery.  - If it fills with liquid or blood please call us immediately to change it for you. - Use the provided ice machine or Ice packs as often as possible for the first 3-4 days, then as needed for pain relief.  Keep a layer of cloth or a shirt between your skin and the cooling unit to prevent frost bite as it can get very cold.  SHOWERING: - You may shower on Post-Op Day #2.  - The dressing is water resistant but do not scrub it as it may start to peel up.   - You may remove the sling for showering, but keep a water resistant pillow under the arm to keep both the  elbow and shoulder away from the body (mimicking the abduction sling).  - Gently pat the area dry.  - Do not soak the shoulder in water. Do not go swimming in the pool or ocean until your sutures are removed. - KEEP THE INCISIONS CLEAN AND DRY.  EXERCISES - Wear the sling at all times.  - You may remove the sling for showering, but keep the arm across the chest or in a secondary sling.    -  Accidental/Purposeful External Rotation and shoulder flexion (reaching behind you) is to be avoided at all costs for the first month.  Please perform the exercises:   Elbow / Hand / Wrist  Range of Motion Exercises Grip strengthening   !!AVOID ANY MOVEMENT OF YOUR SHOULDER!!  REGIONAL ANESTHESIA (NERVE BLOCKS) The anesthesia team may have performed a nerve block for you if safe in the setting of your care.  This is a great tool used to minimize pain.  Typically the block may start wearing off overnight but the long acting medicine may last for 3-4 days.  The nerve block wearing off can be a challenging period but please utilize your as needed pain medications to try and manage this period.    POST-OP MEDICATIONS- Multimodal approach to pain control In general your pain will be controlled with a combination of substances.  Prescriptions unless otherwise discussed are electronically sent to your pharmacy.  This is a carefully made plan we  use to minimize narcotic use.     - Meloxicam OR Celebrex - Anti-inflammatory medication taken on a scheduled basis  - Celebrex 100 mg - 1 tablet twice daily  - Acetaminophen - Non-narcotic pain medicine taken on a scheduled basis   - 1,000 mg every eight hours  - Oxycodone - This is a strong narcotic, to be used only on an "as needed" basis for pain.  - Take this medication ONLY if your pain is NOT controlled by the above medications  - Lovenox - This medicine is used to minimize the risk of blood clots after surgery. (you have this medication at home)  - YOU MUST TAKE THIS MEDICATION DAILY AS WE DISCUSSED  - Zofran -  take as needed for nausea  Meloxicam/Celebrex - these are anti-inflammatory and pain relievers.  Do not take additional ibuprofen, naproxen or other NSAID while taking this medicine.   FOLLOW-UP If you develop a Fever (>101.5), Redness or Drainage from the surgical incision site, please call our office to arrange for an  evaluation. Please call the office to schedule a follow-up appointment for a wound check, 7-10 days post-operatively.  IF YOU HAVE ANY QUESTIONS, PLEASE FEEL FREE TO CALL OUR OFFICE.  HELPFUL INFORMATION  If you had a block, it will wear off between 8-24 hrs postop typically.  This is period when your pain may go from nearly zero to the pain you would have had post-op without the block.  This is an abrupt transition but nothing dangerous is happening.  You may take an extra dose of narcotic when this happens.  Your arm will be in a sling following surgery. You will be in this sling for the next 3-4 weeks.  I will let you know the exact duration at your follow-up visit.  You may be more comfortable sleeping in a semi-seated position the first few nights following surgery.  Keep a pillow propped under the elbow and forearm for comfort.  If you have a recliner type of chair it might be beneficial.  If not that is fine too, but it would be helpful to sleep propped up with pillows behind your operated shoulder as well under your elbow and forearm.  This will reduce pulling on the suture lines.  When dressing, put your operative arm in the sleeve first.  When getting undressed, take your operative arm out last.  Loose fitting, button-down shirts are recommended.  In most states it is against the law to drive while your arm is in a sling. And certainly against the law to drive while taking narcotics.  You may return to work/school in the next couple of days when you feel up to it. Desk work and typing in the sling is fine.  We suggest you use the pain medication the first night prior to going to bed, in order to ease any pain when the anesthesia wears off. You should avoid taking pain medications on an empty stomach as it will make you nauseous.  Do not drink alcoholic beverages or take illicit drugs when taking pain medications.  Pain medication may make you constipated.  Below are a few solutions  to try in this order: Decrease the amount of pain medication if you aren't having pain. Drink lots of decaffeinated fluids. Drink prune juice and/or each dried prunes  If the first 3 don't work start with additional solutions Take Colace - an over-the-counter stool softener Take Senokot - an over-the-counter laxative Take Miralax - a stronger over-the-counter  laxative   Dental Antibiotics:  In most cases prophylactic antibiotics for Dental procdeures after total joint surgery are not necessary.  Exceptions are as follows:  1. History of prior total joint infection  2. Severely immunocompromised (Organ Transplant, cancer chemotherapy, Rheumatoid biologic meds such as Humera)  3. Poorly controlled diabetes (A1C > 8.0, blood glucose over 200)  If you have one of these conditions, contact your surgeon for an antibiotic prescription, prior to your dental procedure.     Allergies as of 08/29/2019      Reactions   Penicillins Itching, Rash   Tolerated Cephalosporin Date: 08/27/19.      Medication List    STOP taking these medications   aspirin 325 MG tablet     TAKE these medications   acetaminophen 500 MG tablet Commonly known as: TYLENOL Take 2 tablets (1,000 mg total) by mouth every 8 (eight) hours for 14 days. What changed:   when to take this  reasons to take this   Afrin NoDrip Severe Congest 0.05 % nasal spray Generic drug: oxymetazoline Place 1 spray into both nostrils as needed for congestion.   ALPRAZolam 1 MG tablet Commonly known as: XANAX Take 1 mg by mouth 3 (three) times daily.   ascorbic acid 500 MG tablet Commonly known as: VITAMIN C Take 1 tablet (500 mg total) by mouth daily.   calcium citrate 950 (200 Ca) MG tablet Commonly known as: CALCITRATE - dosed in mg elemental calcium Take 1 tablet (200 mg of elemental calcium total) by mouth 2 (two) times daily.   calcium-vitamin D 500-200 MG-UNIT tablet Commonly known as: OSCAL WITH D Take 1  tablet by mouth daily.   celecoxib 100 MG capsule Commonly known as: CELEBREX Take 1 capsule (100 mg total) by mouth 2 (two) times daily.   Cholecalciferol 50 MCG (2000 UT) Tabs Take 1 tablet (2,000 Units total) by mouth 2 (two) times daily.   enoxaparin 40 MG/0.4ML injection Commonly known as: LOVENOX Inject 0.4 mLs (40 mg total) into the skin daily for 12 days. What changed: Another medication with the same name was added. Make sure you understand how and when to take each.   enoxaparin 40 MG/0.4ML injection Commonly known as: LOVENOX Inject 0.4 mLs (40 mg total) into the skin daily for 14 days. What changed: You were already taking a medication with the same name, and this prescription was added. Make sure you understand how and when to take each.   ferrous sulfate 325 (65 FE) MG tablet Take 1 tablet (325 mg total) by mouth 2 (two) times daily with a meal.   lactose free nutrition Liqd Take 237 mLs by mouth 3 (three) times daily with meals.   methocarbamol 500 MG tablet Commonly known as: ROBAXIN Take 1 tablet (500 mg total) by mouth every 6 (six) hours as needed for muscle spasms.   mirtazapine 30 MG tablet Commonly known as: REMERON Take 30 mg by mouth at bedtime.   multivitamin with minerals Tabs tablet Take 1 tablet by mouth daily.   omeprazole 20 MG capsule Commonly known as: PRILOSEC Take 20 mg by mouth daily.   ondansetron 4 MG tablet Commonly known as: Zofran Take 1 tablet (4 mg total) by mouth every 8 (eight) hours as needed for up to 7 days for nausea or vomiting.   oxyCODONE 5 MG immediate release tablet Commonly known as: Oxy IR/ROXICODONE Take 1-2 pills every 6 hrs as needed for pain, no more than 6 per day What changed:  how much to take  how to take this  when to take this  reasons to take this  additional instructions   polyethylene glycol 17 g packet Commonly known as: MiraLax Take 17 g by mouth daily as needed for mild constipation.    vitamin E 180 MG (400 UNITS) capsule Take 400 Units by mouth daily.      Follow-up Information    Slatosky, Excell Seltzer., MD Follow up in 1 week(s).   Specialty: Family Medicine Why: hospital discharge follow up, repeat labs cbc/bmp at follow up appointment  Contact information: [address] [place] [address] [phone]

## 2019-08-28 NOTE — Plan of Care (Signed)
Plan of care reviewed and discussed with the patient. 

## 2019-08-29 DIAGNOSIS — M84421A Pathological fracture, right humerus, initial encounter for fracture: Secondary | ICD-10-CM | POA: Diagnosis not present

## 2019-08-29 DIAGNOSIS — I951 Orthostatic hypotension: Secondary | ICD-10-CM

## 2019-08-29 LAB — BASIC METABOLIC PANEL
Anion gap: 7 (ref 5–15)
BUN: 11 mg/dL (ref 8–23)
CO2: 25 mmol/L (ref 22–32)
Calcium: 7.8 mg/dL — ABNORMAL LOW (ref 8.9–10.3)
Chloride: 102 mmol/L (ref 98–111)
Creatinine, Ser: 0.54 mg/dL (ref 0.44–1.00)
GFR calc Af Amer: >60 mL/min (ref >60)
GFR calc non Af Amer: >60 mL/min (ref >60)
Glucose, Bld: 103 mg/dL — ABNORMAL HIGH (ref 70–99)
Potassium: 4.2 mmol/L (ref 3.5–5.1)
Sodium: 134 mmol/L — ABNORMAL LOW (ref 135–145)

## 2019-08-29 LAB — CBC
HCT: 25.1 % — ABNORMAL LOW (ref 36.0–46.0)
Hemoglobin: 7.7 g/dL — ABNORMAL LOW (ref 12.0–15.0)
MCH: 25.7 pg — ABNORMAL LOW (ref 26.0–34.0)
MCHC: 30.7 g/dL (ref 30.0–36.0)
MCV: 83.7 fL (ref 80.0–100.0)
Platelets: 355 10*3/uL (ref 150–400)
RBC: 3 MIL/uL — ABNORMAL LOW (ref 3.87–5.11)
RDW: 18.4 % — ABNORMAL HIGH (ref 11.5–15.5)
WBC: 12.5 10*3/uL — ABNORMAL HIGH (ref 4.0–10.5)
nRBC: 0 % (ref 0.0–0.2)

## 2019-08-29 MED ORDER — ENOXAPARIN SODIUM 40 MG/0.4ML ~~LOC~~ SOLN
40.0000 mg | SUBCUTANEOUS | 0 refills | Status: AC
Start: 2019-08-29 — End: 2019-09-12

## 2019-08-29 MED ORDER — LACTATED RINGERS IV BOLUS
500.0000 mL | Freq: Once | INTRAVENOUS | Status: AC
  Administered 2019-08-29: 500 mL via INTRAVENOUS

## 2019-08-29 NOTE — Progress Notes (Signed)
Called [facility] to report low BP (84/60) and request an IV bolus.  New order: IV Bolus 500 mL @ 999 mL/hr. IV bolus given at 0233. Will continue to monitor patient.

## 2019-08-29 NOTE — Progress Notes (Signed)
Postop hypotension resolved after hydration, there is no fever. She reports feeling back to her baseline.  Repeat orthostatic vital signs has improved: "BP semi-supine 95/69 BP EOB 107/75 BP in chair 106/69  Patient denied any dizziness after transfer"  she is eager to go home.  I advise her to follow-up with PCP next week to repeat lab work including CBC and BMP.   No further recommendations, thank you for allowing [place] to participate patient's care.

## 2019-08-29 NOTE — Plan of Care (Signed)
  Problem: Education: Goal: Knowledge of the prescribed therapeutic regimen will improve Outcome: Progressing Goal: Understanding of activity limitations/precautions following surgery will improve Outcome: Progressing   Problem: Activity: Goal: Ability to tolerate increased activity will improve Outcome: Progressing   Problem: Education: Goal: Knowledge of General Education information will improve Description: Including pain rating scale, medication(s)/side effects and non-pharmacologic comfort measures Outcome: Progressing   Problem: Health Behavior/Discharge Planning: Goal: Ability to manage health-related needs will improve Outcome: Progressing   Problem: Clinical Measurements: Goal: Ability to maintain clinical measurements within normal limits will improve Outcome: Progressing Goal: Will remain free from infection Outcome: Progressing Goal: Diagnostic test results will improve Outcome: Progressing Goal: Respiratory complications will improve Outcome: Progressing   Problem: Activity: Goal: Risk for activity intolerance will decrease Outcome: Progressing   Problem: Coping: Goal: Level of anxiety will decrease Outcome: Progressing   Problem: Elimination: Goal: Will not experience complications related to bowel motility Outcome: Progressing   Problem: Pain Managment: Goal: General experience of comfort will improve Outcome: Progressing   Problem: Safety: Goal: Ability to remain free from injury will improve Outcome: Progressing

## 2019-08-29 NOTE — Progress Notes (Signed)
Alfonse Alpers, PA put in cardiac monitoring order. Made Genella Rife, Charge RN aware. A bed request for 4th floor telemetry was put in. Per Arelia Longest, no beds were available. Called [place], [place] to make her aware of no bed availability for telemetry. Cardiac monitoring discontinued per PA order. Checked on patient. Patient is alert and oriented and resting comfortably. No complaints of chest pain. Will continue to monitor patient per [place] MEWS score protocol.

## 2019-08-29 NOTE — Progress Notes (Signed)
Called the [facility] office to report patient's elevated heart rate. Patient stated that she gets anxious and her heart rate goes up when she has not had Xanax. Gave patient Xanax during the 10 PM hour window (at 2252). Alfonse Alpers, PA was made aware about patient's heart rate. No new orders other than to reorder the patient's Scheduled Tylenol. Will reorder scheduled Tylenol and give per PA order.

## 2019-08-29 NOTE — Progress Notes (Signed)
Occupational Therapy Treatment Patient Details Name: Melanie Parsons MRN: [id number] DOB: 07-27-1955 Today's Date: 08/29/2019    History of present illness Patient is a 64 year old female admitted with Right proximal humerus nonunion and is s/p Right reverse total Shoulder Arthroplasty   OT comments  Patient with decreased carry over of compensatory strategies for UB dressing asking "how am I going to get dressed?" Reviewed and provided visual demo of strategies for UB dressing, pt verbalize understanding. Completed distal AROM for R UE listed below.   BP semi-supine 95/69 BP EOB 107/75 BP in chair 106/69  Patient denied any dizziness after transfer, RN made aware of vitals.    Follow Up Recommendations  Home health OT;Supervision/Assistance - 24 hour;Follow surgeon's recommendation for DC plan and follow-up therapies    Equipment Recommendations  None recommended by OT       Precautions / Restrictions Precautions Precautions: Shoulder Type of Shoulder Precautions: AROM elbow wrist and hand ok, P/AROM shoulder no Shoulder Interventions: Shoulder abduction pillow;Shoulder sling/immobilizer;Off for dressing/bathing/exercises Precaution Booklet Issued: Yes (comment) Required Braces or Orthoses: Sling Restrictions Weight Bearing Restrictions: Yes RUE Weight Bearing: Non weight bearing       Mobility Bed Mobility Overal bed mobility: Needs Assistance Bed Mobility: Supine to Sit     Supine to sit: Min assist;Mod assist;HOB elevated     General bed mobility comments: min/mod to elevate trunk, pt reports will sleep in lift chair initially   Transfers Overall transfer level: Needs assistance Equipment used: 1 person hand held assist Transfers: Sit to/from Stand;Stand Pivot Transfers Sit to Stand: Min guard;From elevated surface Stand pivot transfers: Min assist       General transfer comment: elevated bed height to simulate lift chair at home requiring min A to  power up to standing.     Balance Overall balance assessment: Needs assistance Sitting-balance support: No upper extremity supported;Feet supported Sitting balance-Leahy Scale: Good     Standing balance support: Single extremity supported;During functional activity Standing balance-Leahy Scale: Poor Standing balance comment: reliant on external support, furniture walks at home                           ADL either performed or assessed with clinical judgement   ADL Overall ADL's : Needs assistance/impaired                         Toilet Transfer: Minimal assistance;Stand-pivot;Cueing for safety Toilet Transfer Details (indicate cue type and reason): simulate with transfer to recliner, cues for hand placement           General ADL Comments: patient asking again today how to get dressed, OT provide visual demo and review compensatory strategies for UB dressing.                Cognition Arousal/Alertness: Awake/alert Behavior During Therapy: WFL for tasks assessed/performed Overall Cognitive Status: No family/caregiver present to determine baseline cognitive functioning                                 General Comments: decreased short term memory, asking again today how to get dressed        Exercises Exercises: Shoulder Shoulder Exercises Elbow Flexion: AROM;Right;10 reps;Seated Elbow Extension: AROM;Right;10 reps;Seated Wrist Flexion: AROM;Right;10 reps;Seated Wrist Extension: AROM;Right;10 reps;Seated Digit Composite Flexion: AROM;Right;10 reps;Seated Composite Extension: AROM;Right;10 reps;Seated  Pertinent Vitals/ Pain       Pain Assessment: Faces Faces Pain Scale: Hurts even more Pain Location: R shoulder and neck Pain Descriptors / Indicators: Aching;Grimacing;Discomfort;Moaning Pain Intervention(s): Monitored during session         Frequency  Min 3X/week        Progress Toward Goals  OT  Goals(current goals can now be found in the care plan section)  Progress towards OT goals: Progressing toward goals  Acute Rehab OT Goals Patient Stated Goal: to go home OT Goal Formulation: With patient Time For Goal Achievement: 09/11/19 Potential to Achieve Goals: Good ADL Goals Pt Will Perform Upper Body Dressing: sitting;with modified independence Pt Will Perform Lower Body Dressing: with modified independence;sitting/lateral leans;sit to/from stand Pt Will Transfer to Toilet: with modified independence;bedside commode;stand pivot transfer Pt Will Perform Toileting - Clothing Manipulation and hygiene: with modified independence;sitting/lateral leans;sit to/from stand  Plan Discharge plan remains appropriate       AM-PAC OT "6 Clicks" Daily Activity     Outcome Measure   Help from another person eating meals?: A Little Help from another person taking care of personal grooming?: A Little Help from another person toileting, which includes using toliet, bedpan, or urinal?: A Lot Help from another person bathing (including washing, rinsing, drying)?: A Lot Help from another person to put on and taking off regular upper body clothing?: A Lot Help from another person to put on and taking off regular lower body clothing?: A Lot 6 Click Score: 14    End of Session Equipment Utilized During Treatment: Other (comment)(sling)  OT Visit Diagnosis: Unsteadiness on feet (R26.81);Other abnormalities of gait and mobility (R26.89);Pain Pain - Right/Left: Right Pain - part of body: Shoulder   Activity Tolerance Patient tolerated treatment well   Patient Left in chair;with call bell/phone within reach;with chair alarm set   Nurse Communication Other (comment);Mobility status(BP with activity)        Time: 5726-2035 OT Time Calculation (min): 33 min  Charges: OT General Charges $OT Visit: 1 Visit OT Treatments $Self Care/Home Management : 23-37 mins  Marlyce Huge OT Pager:  [phone]   Carmelia Roller 08/29/2019, 9:12 AM

## 2019-08-29 NOTE — Progress Notes (Signed)
Spoke with Alfonse Alpers, PA on the ortho unit in person at the end of my shift. Made her aware about the patient's latest BP: 89/63. She is also aware of the patient's improved heart rate. Asked her if another bolus could be done. PA stated that she did not want to overload her on fluids. Patient's current LR rate is at 50 mL/hr. PA stated she would talk to the patient's physician about next steps in care.

## 2019-08-29 NOTE — Plan of Care (Signed)
  Problem: Education: Goal: Knowledge of the prescribed therapeutic regimen will improve 08/29/2019 1552 by Minette Brine, RN Outcome: Adequate for Discharge 08/29/2019 0835 by Minette Brine, RN Outcome: Progressing Goal: Understanding of activity limitations/precautions following surgery will improve 08/29/2019 1552 by Minette Brine, RN Outcome: Adequate for Discharge 08/29/2019 0835 by Minette Brine, RN Outcome: Progressing   Problem: Activity: Goal: Ability to tolerate increased activity will improve 08/29/2019 1552 by Minette Brine, RN Outcome: Adequate for Discharge 08/29/2019 0835 by Minette Brine, RN Outcome: Progressing   Problem: Education: Goal: Knowledge of General Education information will improve Description: Including pain rating scale, medication(s)/side effects and non-pharmacologic comfort measures 08/29/2019 1552 by Minette Brine, RN Outcome: Adequate for Discharge 08/29/2019 0835 by Minette Brine, RN Outcome: Progressing   Problem: Health Behavior/Discharge Planning: Goal: Ability to manage health-related needs will improve 08/29/2019 1552 by Minette Brine, RN Outcome: Adequate for Discharge 08/29/2019 0835 by Minette Brine, RN Outcome: Progressing   Problem: Clinical Measurements: Goal: Ability to maintain clinical measurements within normal limits will improve 08/29/2019 1552 by Minette Brine, RN Outcome: Adequate for Discharge 08/29/2019 0835 by Minette Brine, RN Outcome: Progressing Goal: Will remain free from infection 08/29/2019 1552 by Minette Brine, RN Outcome: Adequate for Discharge 08/29/2019 0835 by Minette Brine, RN Outcome: Progressing Goal: Diagnostic test results will improve 08/29/2019 1552 by Minette Brine, RN Outcome: Adequate for Discharge 08/29/2019 0835 by Minette Brine, RN Outcome: Progressing Goal: Respiratory complications will improve 08/29/2019 1552 by Minette Brine,  RN Outcome: Adequate for Discharge 08/29/2019 0835 by Minette Brine, RN Outcome: Progressing   Problem: Activity: Goal: Risk for activity intolerance will decrease 08/29/2019 1552 by Minette Brine, RN Outcome: Adequate for Discharge 08/29/2019 0835 by Minette Brine, RN Outcome: Progressing   Problem: Coping: Goal: Level of anxiety will decrease 08/29/2019 1552 by Minette Brine, RN Outcome: Adequate for Discharge 08/29/2019 0835 by Minette Brine, RN Outcome: Progressing   Problem: Elimination: Goal: Will not experience complications related to bowel motility 08/29/2019 1552 by Minette Brine, RN Outcome: Adequate for Discharge 08/29/2019 0835 by Minette Brine, RN Outcome: Progressing   Problem: Pain Managment: Goal: General experience of comfort will improve 08/29/2019 1552 by Minette Brine, RN Outcome: Adequate for Discharge 08/29/2019 0835 by Minette Brine, RN Outcome: Progressing   Problem: Safety: Goal: Ability to remain free from injury will improve 08/29/2019 1552 by Minette Brine, RN Outcome: Adequate for Discharge 08/29/2019 0835 by Minette Brine, RN Outcome: Progressing

## 2019-08-29 NOTE — Discharge Instructions (Signed)
Please follow-up with your primary care provider to recheck your labs (CBC, BMP) after your hospital stay.

## 2019-08-29 NOTE — Progress Notes (Signed)
Patient being discharged to go home in stable condition. Patient is excited about going home. Husband is present in the room for discharge instructions- no pain at this time.

## 2019-08-29 NOTE — Progress Notes (Signed)
Called [facility] to report patient's heart rate of 119 and elevated RR (22). Applied 02 (2 L) per MD order to help with RR and HR. Alfonse Alpers, [place] called back and stated to keep the patient's oxygen on. She stated that during next set of vitals, if heart rate is in the 120s, to give Metoprolol 2.5 mg (IV). PA also wants to keep close check on BP. The latest BP was 109/70. Will continue to monitor patient.

## 2019-08-29 NOTE — Progress Notes (Addendum)
   ORTHOPAEDIC PROGRESS NOTE  s/p Procedure(s): REVERSE SHOULDER ARTHROPLASTY on 08/27/2019 by Dr. Everardo Pacific  SUBJECTIVE: Patient reports she had some increase in her pain last night, but this morning her pain has improved. She is ready to go home. No chest pain. No SOB. No nausea/vomiting. No other complaints.   OBJECTIVE: PE: General: AAOx3. NAD. Sitting up comfortably in hospital bed.  Right upper extremity: Dressing CDI and sling well fitting,  full and painless ROM throughout hand with DPC of 0. + Motor in  AIN, PIN, Ulnar distributions. Axillary nerve sensation preserved and symmetric.  Sensation intact in medial, radial, and ulnar distributions. Well perfused digits.    Vitals:   08/29/19 0209 08/29/19 0443  BP: (!) 84/60 (!) 89/63  Pulse: (!) 110 88  Resp: 20 16  Temp: 99.5 F (37.5 C) 98.1 F (36.7 C)  SpO2:  99%    ASSESSMENT: Melanie Parsons is a 64 y.o. 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 POD#2.  PLAN: Weightbearing: NWB RUE Insicional and dressing care: Reinforce dressings as needed Orthopedic device(s): Sling Showering: With assistance VTE prophylaxis: Lovenox 40mg  qd  Pain control: PRN pain medications, minimize narcotics as able  Patient had episode of hypotension during surgery. She recovered reasonably after this in PCU. She received a bolus of fluids for low blood pressure (90/65) the afternoon after surgery. She was doing well the morning after surgery, however while working with OT she became hypotensive. Blood pressure recorded as 63/54. She received 1 L bolus. BP improved to 100/65. Medicine was consulted due to recurrent hypotensive episodes. Patient reports she normally has a lower blood pressure. Medicine team recommended continuing fluids and rechecking orthostatics.   Patient's oxygen saturation decreased to 89% during the night while patient was on room air. She was placed back on  nasal cannula. Elevated heart rate was also noted during the night. Patient reports she was having increased in pain during the night as well as anxiety, but denied any chest pain or shortness of breath at that time. Around 2:00 am the patient's blood pressure decreased to 84/60. She received 500 mL bolus. Hgb 7.7 this morning likely acute blood loss anemia after surgery. BMP reassuring this morning. Nurse informed me of patient's BP  at 445 am which was 89/63 and asked about an additional bolus. Did not want to overload the patient with fluids as this seems to be the patient's baseline blood pressure. Recommending continuing current LR rate of 50 ml/hr. Her blood pressure has been recorded as 95/64 and 107/69 since.   Patient is eager to return home to the care of her husband. Her pain has improved. She denies any shortness of breath, chest pain, or dizziness. Medicine to see the patient this morning. From orthopedic standpoint, patient is okay to discharge home. Will wait on medicines opinion regarding discharge. If determined safe for discharge from medical standpoint, may discharge patient home today. She will need to wean off nasal cannula before discharge home.    Follow - up plan: 1 week in office for incision check and x-ray Contact information:  Dr. , [facility] PA-C    [place], [place] 08/29/2019

## 2019-08-29 NOTE — Progress Notes (Signed)
MEWS score now green.  

## 2019-09-02 ENCOUNTER — Encounter: Payer: Self-pay | Admitting: *Deleted

## 2019-09-06 IMAGING — CR DG HIP (WITH OR WITHOUT PELVIS) 2-3V LEFT
2 series · 2 of 2 positions shown · non-contrast
Comparison: 12/26/2017

CLINICAL DATA: Pain following walking with obvious femoral
deformity, initial encounter

EXAM:
DG HIP (WITH OR WITHOUT PELVIS) 2-3V LEFT

[x pelvis]
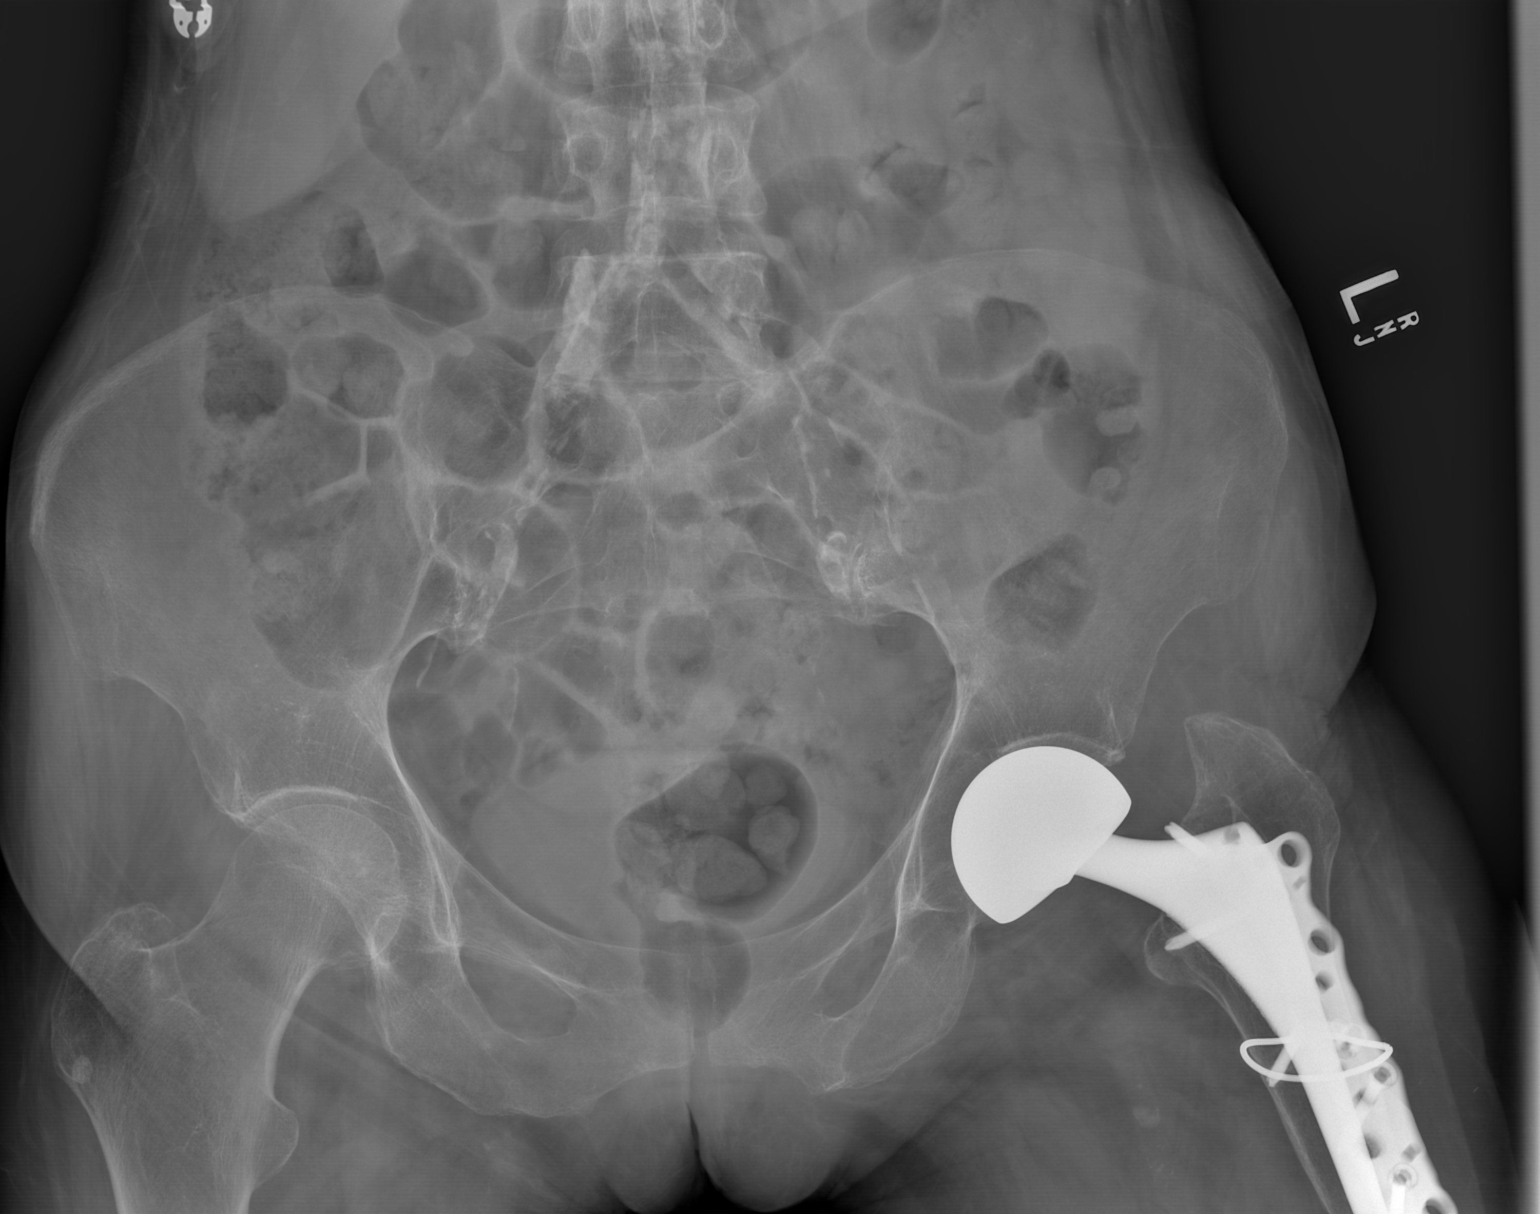

[x hip ap left]
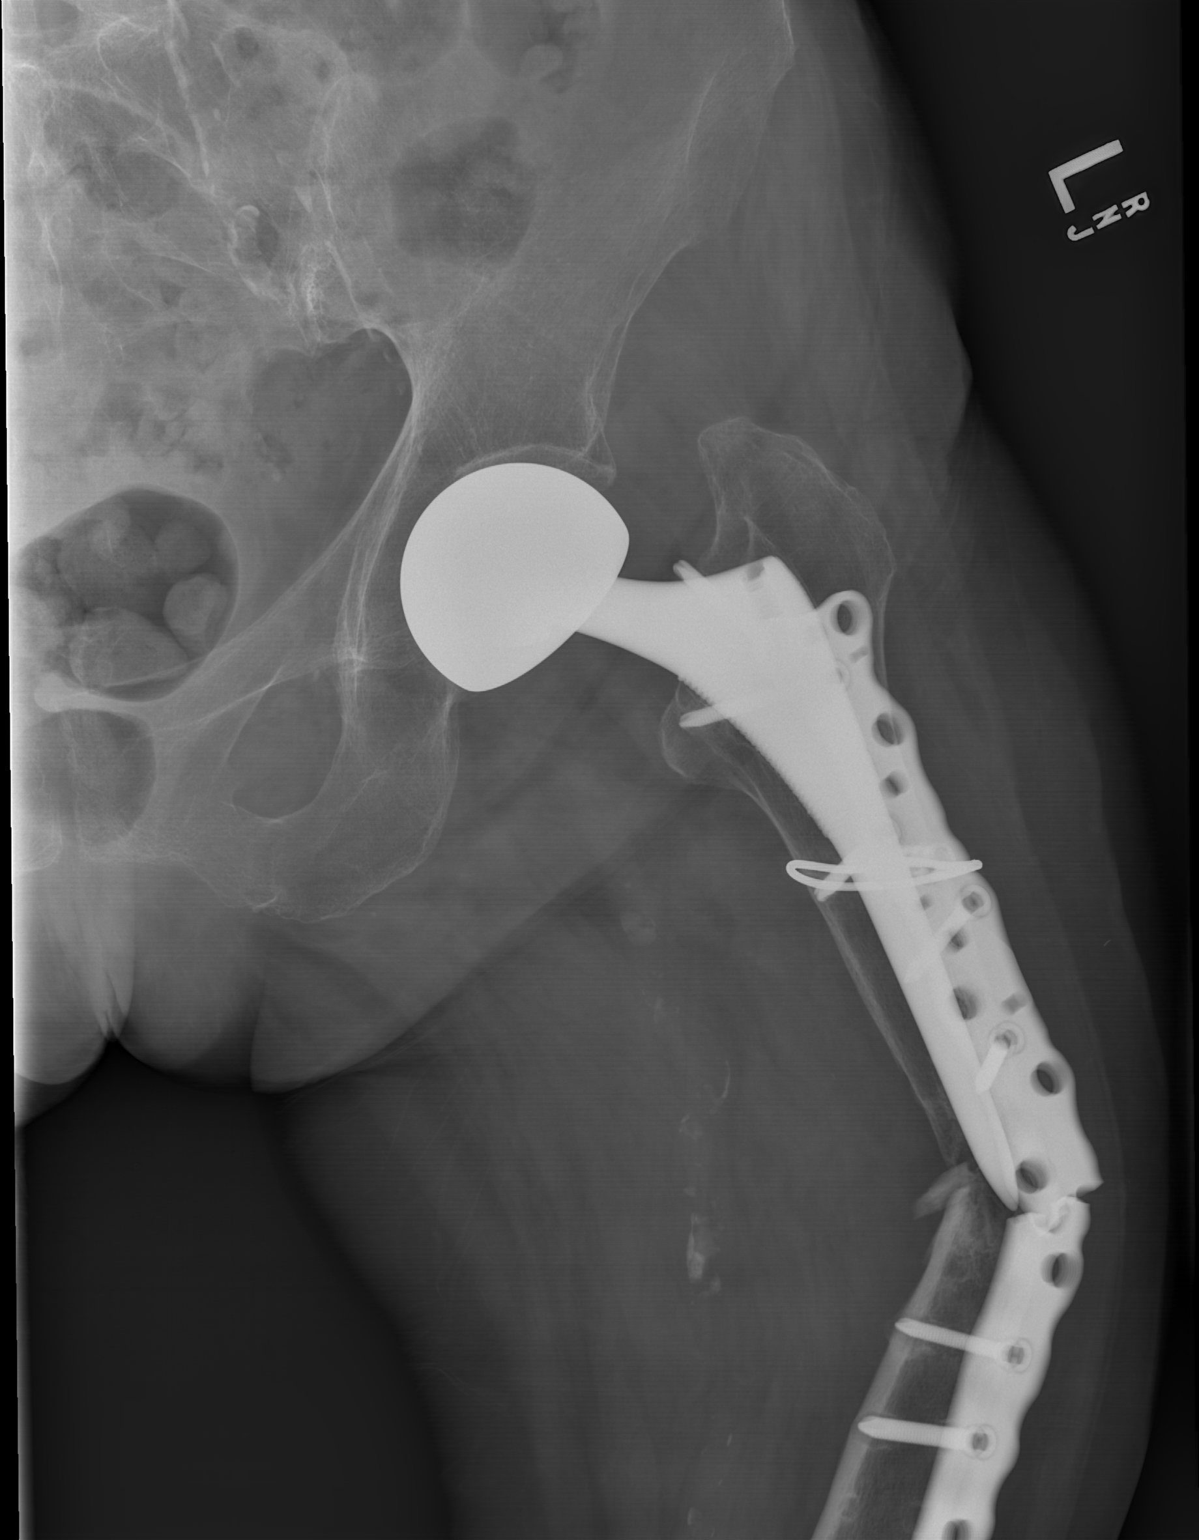

[2 of 2 positions shown; findings below may reference images not displayed]

FINDINGS: There is recurrent fracture in the area of previous midshaft femoral
fracture with a fracture line through the previously placed fixation
side plate. The hip prosthesis appears intact. Medial angulation of
the distal fracture fragment is noted. The femoral component is not
dislocated. The visualized pelvis appears within normal limits.
IMPRESSION: Recurrent fracture in a similar location to that seen on the prior
exam with subsequent fracture of the fixation sideplate.

## 2019-09-08 IMAGING — DX LEFT FEMUR PORTABLE 2 VIEWS
1 series · 4 of 4 positions shown · non-contrast
Comparison: Plain films left femur 10/22/2018.

CLINICAL DATA: Status post revision fracture fixation scratch the
status post revision of a periprosthetic fracture of left femur
today.

EXAM:
LEFT FEMUR PORTABLE 2 VIEWS

[Series 1: femur · 0.14mm/px · 4 of 4 slices shown]
[im 1/4]
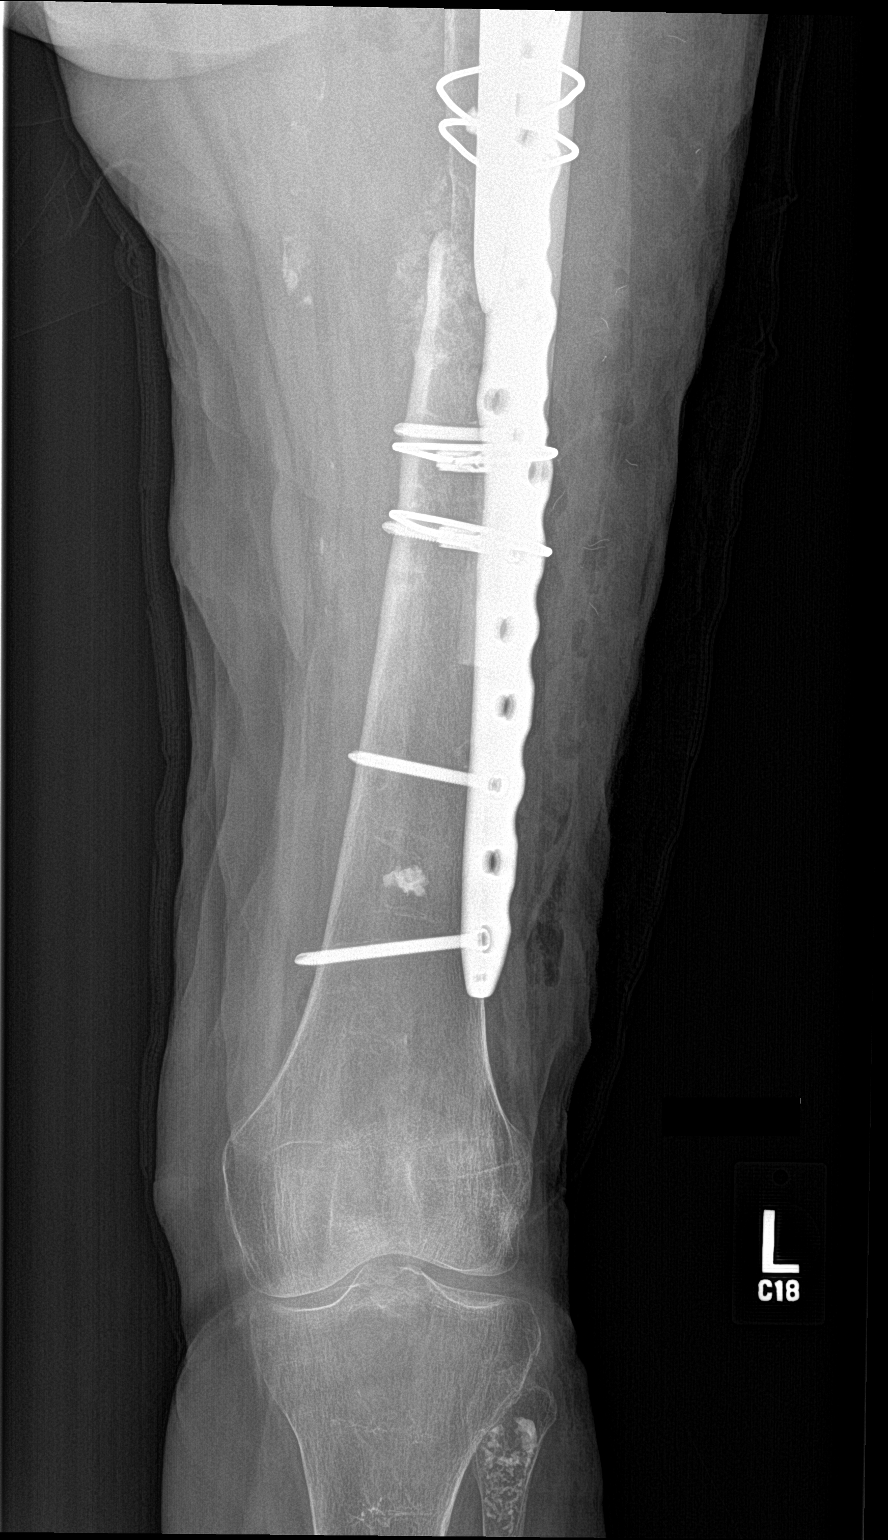
[im 2/4]
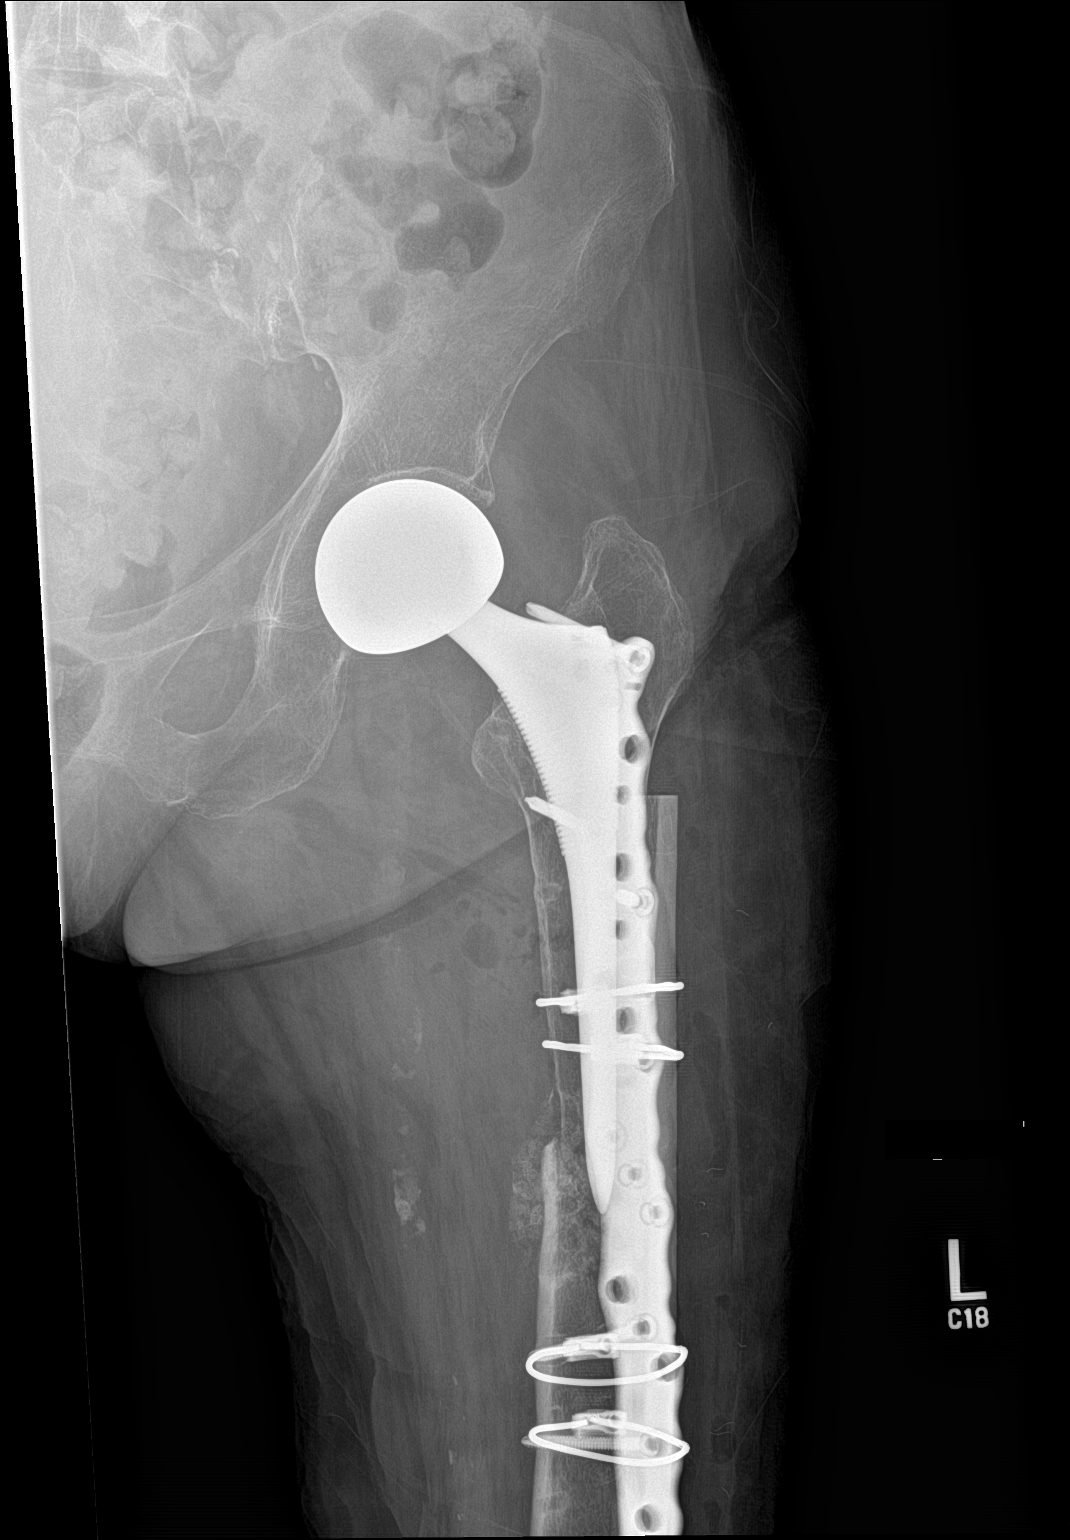
[im 3/4]
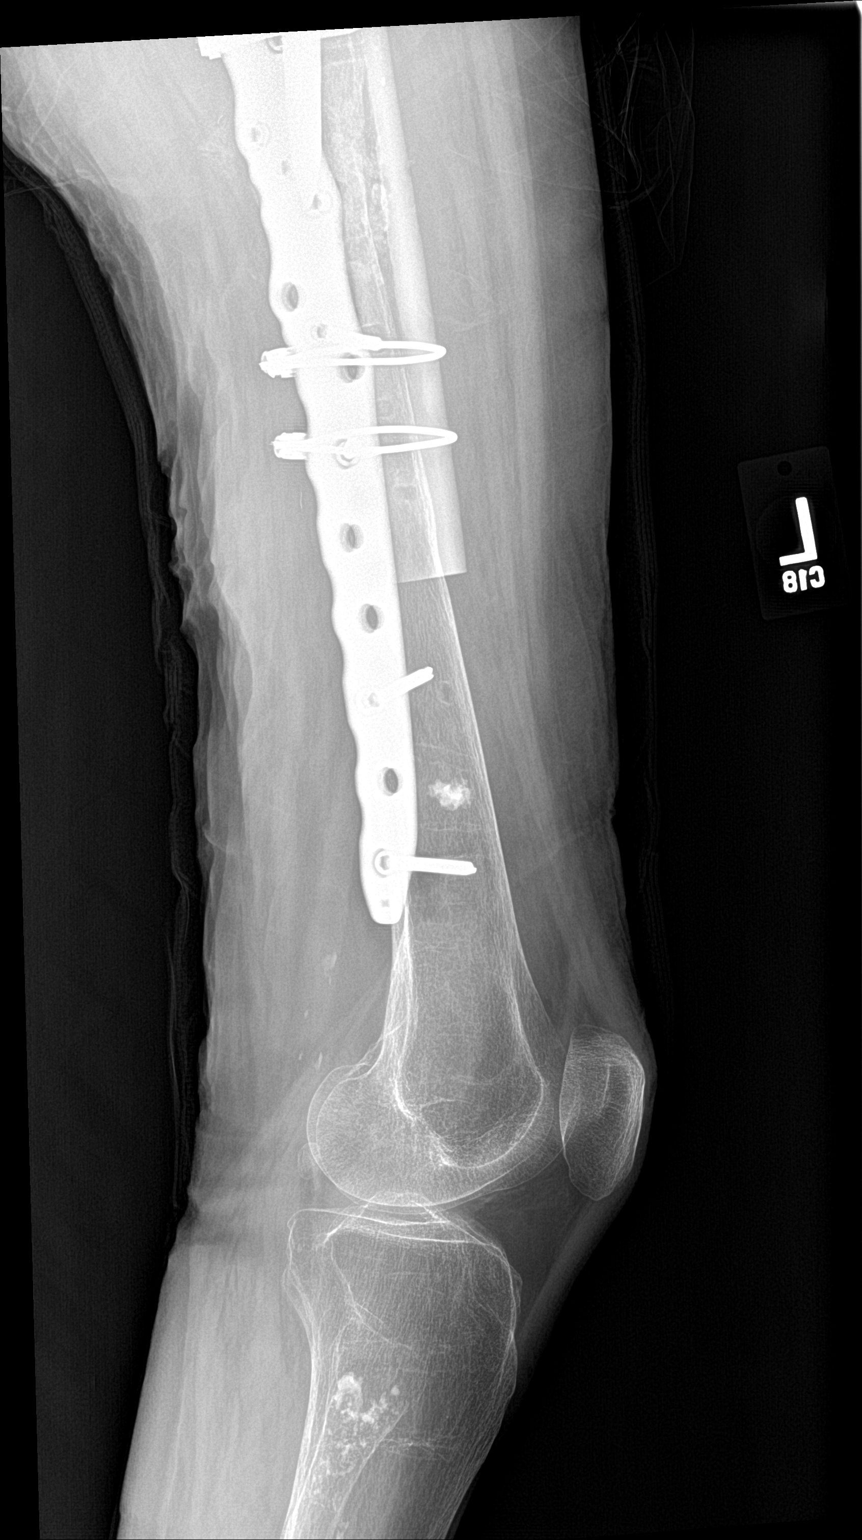
[im 4/4]
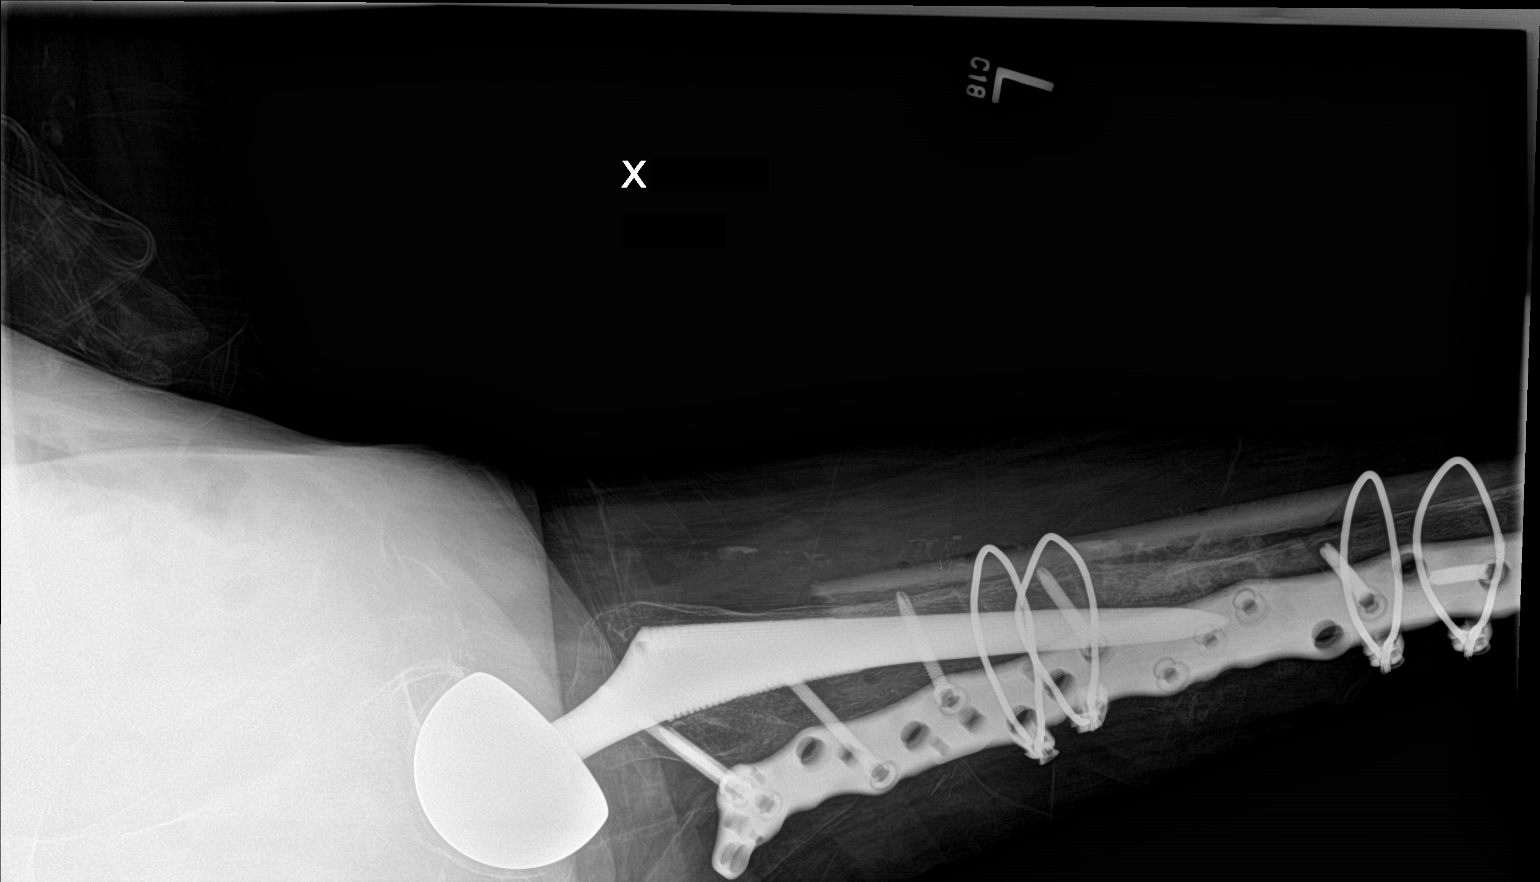

[4 of 4 positions shown; findings below may reference images not displayed]

FINDINGS: Bipolar right hip hemiarthroplasty is again seen. Previously seen
fixation plate has been removed and a new fixation plate and screws
is in place along the lateral aspect of the left femur for fixation
of a fracture at the tip of the femoral stem. Bone graft material is
noted. Hardware is intact. Position and alignment are near anatomic.
No acute abnormality. Bones are osteopenic.
IMPRESSION: Status post revision fixation of a periprosthetic fracture of the
left femur. No acute abnormality

## 2020-07-11 IMAGING — DX DG SHOULDER 2+V PORT*R*
1 series · 1 of 1 positions shown · non-contrast
Comparison: Intraoperative images obtained earlier in the day and
right shoulder CT August 22, 2019

CLINICAL DATA: Status post total shoulder replacement

EXAM:
PORTABLE RIGHT SHOULDER

[shoulder ap]
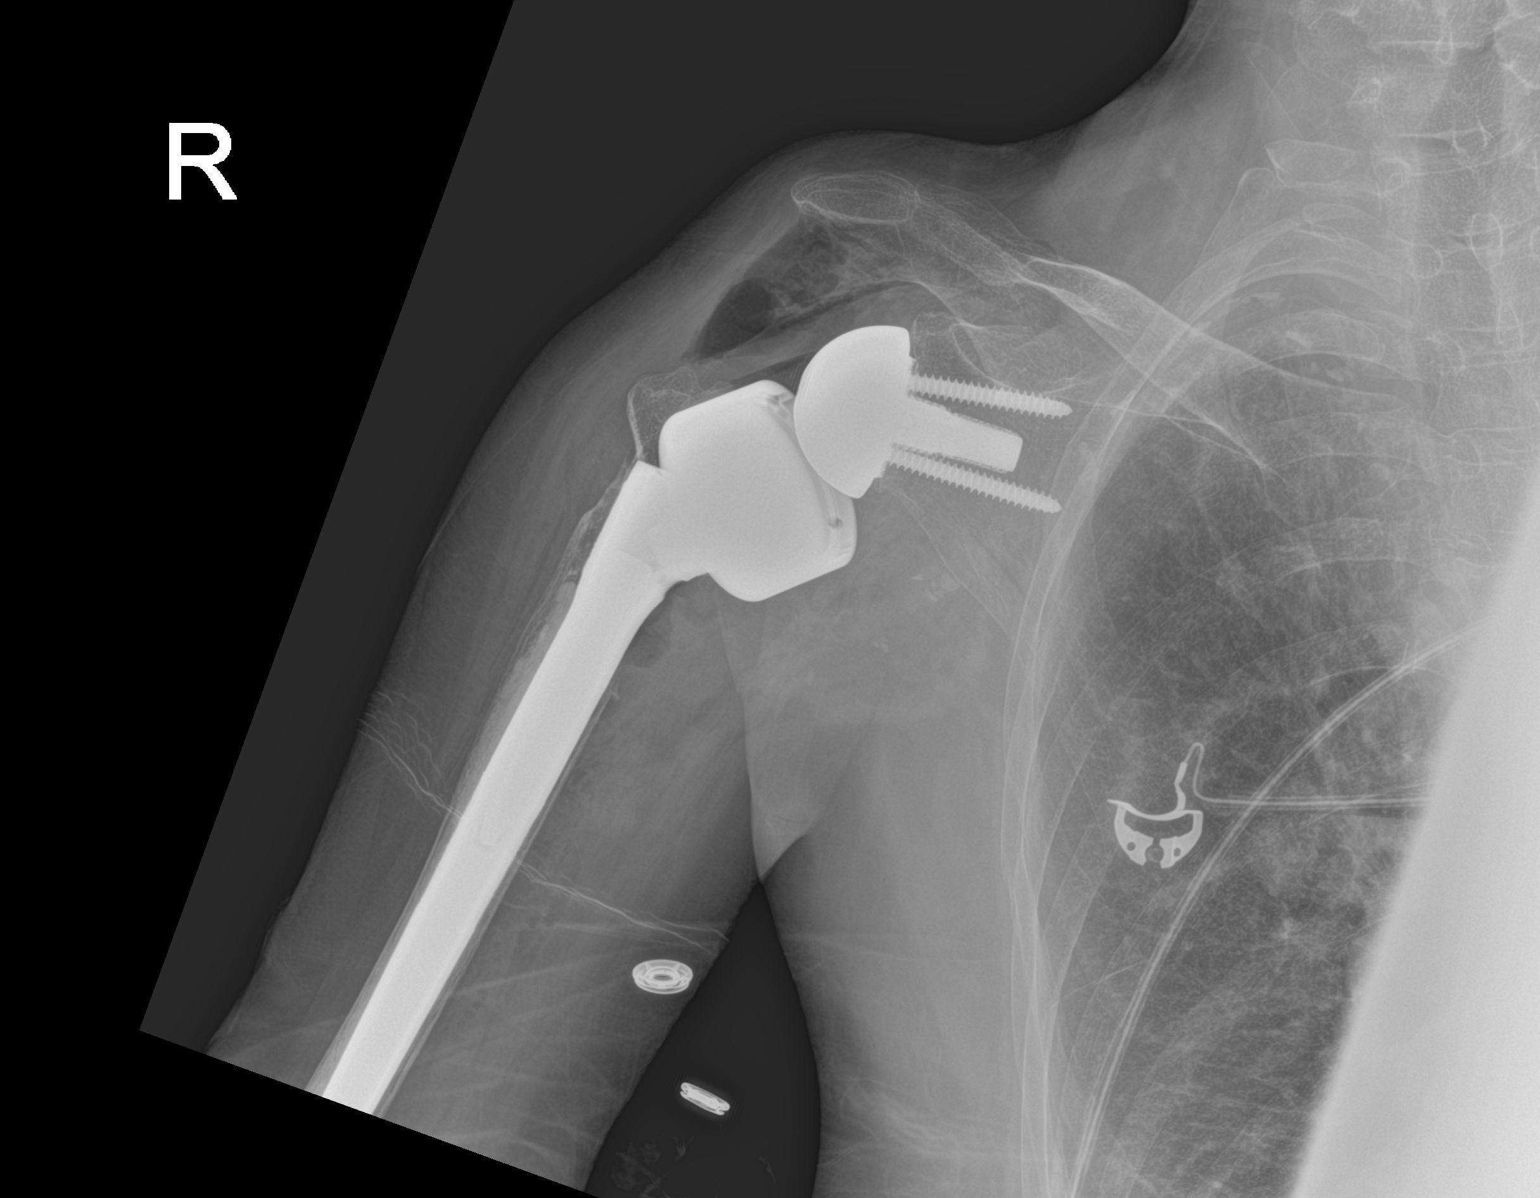

[1 of 1 positions shown; findings below may reference images not displayed]

FINDINGS: Frontal view obtained. There is a total shoulder replacement on the
right with prosthetic components well-seated. Evidence of old trauma
with remodeling lateral right clavicle. No acute fracture or
dislocation. Air within the joint is an expected postoperative
finding. Visualized right lung clear.
IMPRESSION: Total shoulder replacement with prosthetic components well-seated on
frontal view. Old trauma with remodeling lateral right clavicle. No
acute fracture or dislocation.

## 2022-06-16 DEATH — deceased
